# Patient Record
Sex: Female | Born: 1990 | Race: White | Hispanic: No | Smoking: Never smoker
Health system: Southern US, Community
[De-identification: ages and names within clinical notes are randomized; demographics above are authoritative.]

## PROBLEM LIST (undated history)

## (undated) ENCOUNTER — Inpatient Hospital Stay: Payer: Self-pay

## (undated) DIAGNOSIS — F329 Major depressive disorder, single episode, unspecified: Secondary | ICD-10-CM

## (undated) DIAGNOSIS — F909 Attention-deficit hyperactivity disorder, unspecified type: Secondary | ICD-10-CM

## (undated) DIAGNOSIS — R569 Unspecified convulsions: Secondary | ICD-10-CM

## (undated) DIAGNOSIS — F32A Depression, unspecified: Secondary | ICD-10-CM

## (undated) HISTORY — DX: Depression, unspecified: F32.A

## (undated) HISTORY — DX: Unspecified convulsions: R56.9

## (undated) HISTORY — DX: Attention-deficit hyperactivity disorder, unspecified type: F90.9

## (undated) HISTORY — PX: APPENDECTOMY: SHX54

## (undated) HISTORY — DX: Major depressive disorder, single episode, unspecified: F32.9

---
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
## 2017-05-24 NOTE — L&D Delivery Note (Signed)
Delivery Note  Date of delivery: 02/09/2018 Estimated Date of Delivery: 02/16/18 Patient's last menstrual period was 05/12/2017. EGA: 1582w0d  First Stage: Labor onset: 02/09/2018 0700 Augmentation : pitocin during second stage Analgesia /Anesthesia intrapartum: epidural SROM at 0700 02/09/2018  Melinda Mills presented to L&D with SROM. She was expectantly managed. Epidural placed for pain relief.   Second Stage: Complete dilation at 1311 Onset of pushing at 1316 Delivery at 1605 on 02/09/2018  She progressed to complete. Pitocin was used for augmentation during second stage. She had a spontaneous vaginal birth of a live female over an intact perineum. The fetal head was delivered in direct OA position with restitution to ROT. No nuchal cord. Anterior then posterior shoulders delivered spontaneously. Baby placed on mom's abdomen and attended to by transition RN. Cord clamped and cut after 2 minute delay by father of the baby, Melinda Mills. Cord blood obtained for newborn labs.  Third Stage: Placenta delivered spontaneously intact with 3VC at 1610 Placenta disposition: routine disposal Uterine bleeding brisk initially, bleeding slowed and uterine tone firm with uterine massage. Bleeding intermittently brisk, though slowed with massage each time. Large clots expressed with uterine massage. By the end of laceration repair, uterine tone remained firm with scant bleeding.  IV pitocin given for hemorrhage prophylaxis, along with 800mcg misoprostol PR.   2nd degree laceration identified  Anesthesia for repair: epidural Repair: 3-0 vicryl rapide CT Est. Blood Loss (mL): 800  Complications: none  Mom to postpartum.  Baby to Couplet care / Skin to Skin.  Newborn: Birth Weight: pending  Apgar Scores: 8, 9 Feeding planned: breastfeeding   Melinda Mills, CNM 02/09/2018 4:46 PM

## 2017-07-28 DIAGNOSIS — O0990 Supervision of high risk pregnancy, unspecified, unspecified trimester: Secondary | ICD-10-CM | POA: Insufficient documentation

## 2017-07-28 DIAGNOSIS — Q51818 Other congenital malformations of uterus: Secondary | ICD-10-CM | POA: Insufficient documentation

## 2017-07-28 LAB — OB RESULTS CONSOLE RPR: RPR: NONREACTIVE

## 2017-07-28 LAB — OB RESULTS CONSOLE VARICELLA ZOSTER ANTIBODY, IGG: VARICELLA IGG: NON-IMMUNE/NOT IMMUNE

## 2017-07-28 LAB — OB RESULTS CONSOLE HIV ANTIBODY (ROUTINE TESTING): HIV: NONREACTIVE

## 2017-07-28 LAB — OB RESULTS CONSOLE HEPATITIS B SURFACE ANTIGEN: Hepatitis B Surface Ag: NEGATIVE

## 2017-07-28 LAB — OB RESULTS CONSOLE RUBELLA ANTIBODY, IGM: RUBELLA: IMMUNE

## 2017-07-29 ENCOUNTER — Other Ambulatory Visit: Payer: Self-pay | Admitting: Obstetrics and Gynecology

## 2017-07-29 DIAGNOSIS — O0991 Supervision of high risk pregnancy, unspecified, first trimester: Secondary | ICD-10-CM

## 2017-07-29 DIAGNOSIS — Z369 Encounter for antenatal screening, unspecified: Secondary | ICD-10-CM

## 2017-08-18 ENCOUNTER — Ambulatory Visit (HOSPITAL_BASED_OUTPATIENT_CLINIC_OR_DEPARTMENT_OTHER)
Admission: RE | Admit: 2017-08-18 | Discharge: 2017-08-18 | Disposition: A | Discharge status: Home / Self Care | Payer: BC Managed Care – PPO | Source: Ambulatory Visit | Attending: Obstetrics and Gynecology | Admitting: Obstetrics and Gynecology

## 2017-08-18 ENCOUNTER — Ambulatory Visit
Admission: RE | Admit: 2017-08-18 | Discharge: 2017-08-18 | Disposition: A | Discharge status: Home / Self Care | Payer: BC Managed Care – PPO | Source: Ambulatory Visit | Attending: Obstetrics & Gynecology | Admitting: Obstetrics & Gynecology

## 2017-08-18 VITALS — BP 138/68 | HR 100 | Temp 98.0°F | Resp 18 | Ht 63.6 in | Wt 144.6 lb

## 2017-08-18 DIAGNOSIS — Z90721 Acquired absence of ovaries, unilateral: Secondary | ICD-10-CM | POA: Insufficient documentation

## 2017-08-18 DIAGNOSIS — Z369 Encounter for antenatal screening, unspecified: Secondary | ICD-10-CM

## 2017-08-18 DIAGNOSIS — Z3A14 14 weeks gestation of pregnancy: Secondary | ICD-10-CM | POA: Diagnosis not present

## 2017-08-18 DIAGNOSIS — Q600 Renal agenesis, unilateral: Secondary | ICD-10-CM | POA: Diagnosis not present

## 2017-08-18 DIAGNOSIS — Z820 Family history of epilepsy and other diseases of the nervous system: Secondary | ICD-10-CM | POA: Diagnosis not present

## 2017-08-18 NOTE — Progress Notes (Signed)
Deborah Wells, MS, CGC performed an integral service incident to the physician's initial service.  I was physically present in the clinical area and was immediately available to render assistance.   Chaunta Bejarano M. Edrei Norgaard, MD 

## 2017-08-18 NOTE — Progress Notes (Signed)
McVey, [name] Length of Consultation: 60 minutes   [name]  was referred to [facility] for genetic counseling to review prenatal screening and testing options along with her personal history of Mullerian anomalies and family history of Huntington disease.  This note summarizes the information we discussed.    We offered the following routine screening tests for this pregnancy:  First trimester screening, which includes nuchal translucency ultrasound screen and first trimester maternal serum marker screening.  The nuchal translucency has approximately an 80% detection rate for Down syndrome and can be positive for other chromosome abnormalities as well as congenital heart defects.  When combined with a maternal serum marker screening, the detection rate is up to 90% for Down syndrome and up to 97% for trisomy 18.     Maternal serum marker screening, a blood test that measures pregnancy proteins, can provide risk assessments for Down syndrome, trisomy 18, and open neural tube defects (spina bifida, anencephaly). Because it does not directly examine the fetus, it cannot positively diagnose or rule out these problems.  Targeted ultrasound uses high frequency sound waves to create an image of the developing fetus.  An ultrasound is often recommended as a routine means of evaluating the pregnancy.  It is also used to screen for fetal anatomy problems (for example, a heart defect) that might be suggestive of a chromosomal or other abnormality.   Should these screening tests indicate an increased concern, then the following additional testing options would be offered:  The chorionic villus sampling procedure is available for first trimester chromosome analysis.  This involves the withdrawal of a small amount of chorionic villi (tissue from the developing placenta).  Risk of pregnancy loss is estimated to be approximately 1 in 200 to 1 in 100 (0.5 to 1%).  There is  approximately a 1% (1 in 100) chance that the CVS chromosome results will be unclear.  Chorionic villi cannot be tested for neural tube defects.     Amniocentesis involves the removal of a small amount of amniotic fluid from the sac surrounding the fetus with the use of a thin needle inserted through the maternal abdomen and uterus.  Ultrasound guidance is used throughout the procedure.  Fetal cells from amniotic fluid are directly evaluated and > 99.5% of chromosome problems and > 98% of open neural tube defects can be detected. This procedure is generally performed after the 15th week of pregnancy.  The main risks to this procedure include complications leading to miscarriage in less than 1 in 200 cases (0.5%).  As another option for information if the pregnancy is suspected to be an an increased chance for certain chromosome conditions, we also reviewed the availability of cell free fetal DNA testing from maternal blood to determine whether or not the baby may have either Down syndrome, trisomy 6313, or trisomy 5318.  This test utilizes a maternal blood sample and DNA sequencing technology to isolate circulating cell free fetal DNA from maternal plasma.  The fetal DNA can then be analyzed for DNA sequences that are derived from the three most common chromosomes involved in aneuploidy, chromosomes 13, 18, and 21.  If the overall amount of DNA is greater than the expected level for any of these chromosomes, aneuploidy is suspected.  While we do not consider it a replacement for invasive testing and karyotype analysis, a negative result from this testing would be reassuring, though not a guarantee of a normal chromosome complement for the baby.  An  abnormal result is certainly suggestive of an abnormal chromosome complement, though we would still recommend CVS or amniocentesis to confirm any findings from this testing.  Cystic Fibrosis and Spinal Muscular Atrophy (SMA) screening were also discussed with the  patient. Both conditions are recessive, which means that both parents must be carriers in order to have a child with the disease.  Cystic fibrosis (CF) is one of the most common genetic conditions in persons of Caucasian ancestry.  This condition occurs in approximately 1 in 2,500 Caucasian persons and results in thickened secretions in the lungs, digestive, and reproductive systems.  For a baby to be at risk for having CF, both of the parents must be carriers for this condition.  Approximately 1 in 74 Caucasian persons is a carrier for CF.  Current carrier testing looks for the most common mutations in the gene for CF and can detect approximately 90% of carriers in the Caucasian population.  This means that the carrier screening can greatly reduce, but cannot eliminate, the chance for an individual to have a child with CF.  If an individual is found to be a carrier for CF, then carrier testing would be available for the partner. As part of [place]'s newborn screening profile, all babies born in the state of [place] will have a two-tier screening process.  Specimens are first tested to determine the concentration of immunoreactive trypsinogen (IRT).  The top 5% of specimens with the highest IRT values then undergo DNA testing using a panel of over 40 common CF mutations. SMA is a neurodegenerative disorder that leads to atrophy of skeletal muscle and overall weakness.  This condition is also more prevalent in the Caucasian population, with 1 in 40-1 in 60 persons being a carrier and 1 in 6,000-1 in 10,000 children being affected.  There are multiple forms of the disease, with some causing death in infancy to other forms with survival into adulthood.  The genetics of SMA is complex, but carrier screening can detect up to 95% of carriers in the Caucasian population.  Similar to CF, a negative result can greatly reduce, but cannot eliminate, the chance to have a child with SMA.  We obtained a detailed  family history and pregnancy history.  [name] reported that this is her first pregnancy.  She found out when she had appendicitis in her teens that she has a congenital absent right kidney, fallopian tube and ovary as well as an abnormally shaped uterus and possible abnormal vagina, though she was not sure of the details. She has no other health concerns, birth differences or developmental delays.  There are no other family members with similar birth differences.  We discussed that it is not uncommon to see this combination of anomalies together.  These may be referred to as Mullerian duct anomalies and may occur sporadically or as part of some genetic syndromes.  The chance for recurrence in the children of individuals with these anomalies will vary depending upon the underlying cause and may range from a very small chance for sporadic cases up to 50% in individuals with a dominant genetic cause.  The best way to evaluate this pregnancy would be with a level 2 ultrasound in the second trimester, which we have scheduled in our clinic at [redacted] weeks gestation.  She signed a medical record release for [place] to obtain results of imaging that showed her anatomical differences, per Dr. Janeann Forehand request.  If desired, we could consider a referral for a medical  genetics consultation if an evaluation is desired to assess [name] for possible genetic syndromes.  Also in the family history, [name] reported that her maternal grandmother had Huntington disease (HD) and underwent genetic testing; her mother also had genetic testing with an increased number of repeats.  The mother then spoke with our patient about HD genetic testing, which she elected to have while in college.  No formal neurological evaluation, psychological evaluation or genetic counseling was offered as part of that testing.  The results were said to have been given to her by her OB and were in the "grey zone", though she is not sure of the exact repeat  number (she thinks 39).  We offered to provide a medical record release, which she signed, to allow [place] to obtain those results.  I reviewed with her the usual protocol for presymptomatic HD testing and the importance of making sure she has the most complete information possible, which would ideally include meeting with a genetic counselor who specializes in HD testing.  We will work to facilitate this as desired.  I will follow up with her once these results are obtained.  We did review the dominant inheritance of HD and the complex considerations associated with prenatal diagnosis for this condition.  The remainder of the family history was reported to be unremarkable for birth defects, intellectual delays, recurrent pregnancy loss or known chromosome abnormalities.  [name] reported no complications or exposures in this pregnancy that would be expected to increase the risk for birth defects.  After consideration of the options, [name] elected to proceed with first trimester screening.  We will contact her once we get the HD test results.  She declined CF and SMA carrier screening.  An ultrasound was performed at the time of the visit.  The gestational age was consistent with 14 weeks.  Fetal anatomy could not be assessed due to early gestational age.  Please refer to the ultrasound report for details of that study.  [name] was encouraged to call with questions or concerns.  We can be contacted at [phone].  Tests ordered:  First trimester screening, record release forms sent   Cherly Anderson, MS, CGC

## 2017-09-01 ENCOUNTER — Telehealth: Payer: Self-pay | Admitting: Obstetrics and Gynecology

## 2017-09-01 NOTE — Telephone Encounter (Signed)
  Ms. Daphine DeutscherMartin elected to undergo First Trimester screening on 08/18/17.  To review, first trimester screening, includes nuchal translucency ultrasound screen and/or first trimester maternal serum marker screening.  The nuchal translucency has approximately an 80% detection rate for Down syndrome and can be positive for other chromosome abnormalities as well as heart defects.  When combined with a maternal serum marker screening, the detection rate is up to 90% for Down syndrome and up to 97% for trisomy 13 and 18.     The results of the First Trimester Nuchal Translucency and Biochemical Screening were within normal range.  The risk for Down syndrome is now estimated to be less than 1 in 10,000.  The risk for Trisomy 13/18 is also estimated to be less than 1 in 10,000.  Should more definitive information be desired, we would offer amniocentesis.  Because we do not yet know the effectiveness of combined first and second trimester screening, we do not recommend a maternal serum screen to assess the chance for chromosome conditions.  However, if screening for neural tube defects is desired, maternal serum screening for AFP only can be performed between 15 and [redacted] weeks gestation.    We will contact the patient with the number to schedule a genetics visit to discuss her prior Huntington disease testing at her convenience.  This was performed several years ago, but did not include a formal genetics consultation which would be recommended to review those results in detail.  Cherly Andersoneborah F. Chasmine Lender, MS, CGC

## 2017-09-08 ENCOUNTER — Other Ambulatory Visit: Payer: Self-pay | Admitting: *Deleted

## 2017-09-08 DIAGNOSIS — Z90721 Acquired absence of ovaries, unilateral: Secondary | ICD-10-CM

## 2017-09-08 DIAGNOSIS — Q600 Renal agenesis, unilateral: Secondary | ICD-10-CM

## 2017-09-15 ENCOUNTER — Ambulatory Visit
Admission: RE | Admit: 2017-09-15 | Discharge: 2017-09-15 | Disposition: A | Discharge status: Home / Self Care | Payer: BC Managed Care – PPO | Source: Ambulatory Visit | Attending: Obstetrics and Gynecology | Admitting: Obstetrics and Gynecology

## 2017-09-15 DIAGNOSIS — Z3A18 18 weeks gestation of pregnancy: Secondary | ICD-10-CM | POA: Insufficient documentation

## 2017-09-15 DIAGNOSIS — Q600 Renal agenesis, unilateral: Secondary | ICD-10-CM

## 2018-01-24 LAB — OB RESULTS CONSOLE GC/CHLAMYDIA
Chlamydia: NEGATIVE
Gonorrhea: NEGATIVE

## 2018-01-24 LAB — OB RESULTS CONSOLE GBS: GBS: NEGATIVE

## 2018-02-09 ENCOUNTER — Other Ambulatory Visit: Payer: Self-pay

## 2018-02-09 ENCOUNTER — Inpatient Hospital Stay
Admission: EM | Admit: 2018-02-09 | Discharge: 2018-02-11 | DRG: 807 | Disposition: A | Discharge status: Home / Self Care | Payer: BC Managed Care – PPO | Attending: Certified Nurse Midwife | Admitting: Certified Nurse Midwife

## 2018-02-09 ENCOUNTER — Inpatient Hospital Stay: Payer: BC Managed Care – PPO | Admitting: Certified Registered"

## 2018-02-09 DIAGNOSIS — Z3483 Encounter for supervision of other normal pregnancy, third trimester: Secondary | ICD-10-CM | POA: Diagnosis present

## 2018-02-09 DIAGNOSIS — Q514 Unicornate uterus: Secondary | ICD-10-CM

## 2018-02-09 DIAGNOSIS — Z3A39 39 weeks gestation of pregnancy: Secondary | ICD-10-CM | POA: Diagnosis not present

## 2018-02-09 DIAGNOSIS — O3403 Maternal care for unspecified congenital malformation of uterus, third trimester: Secondary | ICD-10-CM | POA: Diagnosis present

## 2018-02-09 LAB — CBC
HCT: 34.3 % — ABNORMAL LOW (ref 35.0–47.0)
HEMATOCRIT: 29.6 % — AB (ref 35.0–47.0)
HEMOGLOBIN: 10.2 g/dL — AB (ref 12.0–16.0)
Hemoglobin: 12.1 g/dL (ref 12.0–16.0)
MCH: 30.6 pg (ref 26.0–34.0)
MCH: 30.2 pg (ref 26.0–34.0)
MCHC: 35.2 g/dL (ref 32.0–36.0)
MCHC: 34.3 g/dL (ref 32.0–36.0)
MCV: 86.8 fL (ref 80.0–100.0)
MCV: 87.9 fL (ref 80.0–100.0)
PLATELETS: 163 10*3/uL (ref 150–440)
Platelets: 152 10*3/uL (ref 150–440)
RBC: 3.95 MIL/uL (ref 3.80–5.20)
RBC: 3.37 MIL/uL — AB (ref 3.80–5.20)
RDW: 14.7 % — ABNORMAL HIGH (ref 11.5–14.5)
RDW: 14.8 % — ABNORMAL HIGH (ref 11.5–14.5)
WBC: 10.6 10*3/uL (ref 3.6–11.0)
WBC: 16.5 10*3/uL — AB (ref 3.6–11.0)

## 2018-02-09 LAB — TYPE AND SCREEN
ABO/RH(D): O POS
Antibody Screen: NEGATIVE

## 2018-02-09 LAB — COMPREHENSIVE METABOLIC PANEL
ALK PHOS: 141 U/L — AB (ref 38–126)
ALT: 18 U/L (ref 0–44)
AST: 44 U/L — AB (ref 15–41)
Albumin: 2.8 g/dL — ABNORMAL LOW (ref 3.5–5.0)
Anion gap: 8 (ref 5–15)
BUN: 8 mg/dL (ref 6–20)
CALCIUM: 8.6 mg/dL — AB (ref 8.9–10.3)
CO2: 24 mmol/L (ref 22–32)
CREATININE: 0.88 mg/dL (ref 0.44–1.00)
Chloride: 103 mmol/L (ref 98–111)
GFR calc non Af Amer: >60 mL/min (ref >60)
Glucose, Bld: 111 mg/dL — ABNORMAL HIGH (ref 70–99)
Potassium: 3.7 mmol/L (ref 3.5–5.1)
SODIUM: 135 mmol/L (ref 135–145)
Total Bilirubin: 0.2 mg/dL — ABNORMAL LOW (ref 0.3–1.2)
Total Protein: 5.5 g/dL — ABNORMAL LOW (ref 6.5–8.1)

## 2018-02-09 LAB — PROTEIN / CREATININE RATIO, URINE
Creatinine, Urine: 100 mg/dL
PROTEIN CREATININE RATIO: 0.2 mg/mg{creat} — AB (ref 0.00–0.15)
TOTAL PROTEIN, URINE: 20 mg/dL

## 2018-02-09 MED ORDER — IBUPROFEN 600 MG PO TABS
600.0000 mg | ORAL_TABLET | Freq: Four times a day (QID) | ORAL | Status: DC
  Administered 2018-02-09 – 2018-02-11 (×8): 600 mg via ORAL
  Filled 2018-02-09 (×9): qty 1

## 2018-02-09 MED ORDER — LIDOCAINE-EPINEPHRINE (PF) 1.5 %-1:200000 IJ SOLN
INTRAMUSCULAR | Status: DC | PRN
  Administered 2018-02-09: 3 mL via EPIDURAL

## 2018-02-09 MED ORDER — ACETAMINOPHEN 325 MG PO TABS: 650.0000 mg | ORAL_TABLET | ORAL | Status: DC | PRN

## 2018-02-09 MED ORDER — SENNOSIDES-DOCUSATE SODIUM 8.6-50 MG PO TABS
2.0000 | ORAL_TABLET | ORAL | Status: DC
  Administered 2018-02-09 – 2018-02-10 (×2): 2 via ORAL
  Filled 2018-02-09 (×2): qty 2

## 2018-02-09 MED ORDER — OXYTOCIN 10 UNIT/ML IJ SOLN
INTRAMUSCULAR | Status: AC
  Filled 2018-02-09: qty 2

## 2018-02-09 MED ORDER — SIMETHICONE 80 MG PO CHEW: 160.0000 mg | CHEWABLE_TABLET | ORAL | Status: DC | PRN

## 2018-02-09 MED ORDER — SOD CITRATE-CITRIC ACID 500-334 MG/5ML PO SOLN: 30.0000 mL | ORAL | Status: DC | PRN

## 2018-02-09 MED ORDER — OXYTOCIN 40 UNITS IN LACTATED RINGERS INFUSION - SIMPLE MED: 2.5000 [IU]/h | INTRAVENOUS | Status: DC

## 2018-02-09 MED ORDER — LACTATED RINGERS IV SOLN: 500.0000 mL | INTRAVENOUS | Status: DC | PRN

## 2018-02-09 MED ORDER — DIBUCAINE 1 % RE OINT
1.0000 "application " | TOPICAL_OINTMENT | RECTAL | Status: DC | PRN
  Administered 2018-02-09 – 2018-02-11 (×2): 1 via RECTAL
  Filled 2018-02-09 (×2): qty 28

## 2018-02-09 MED ORDER — BUPIVACAINE HCL (PF) 0.25 % IJ SOLN
INTRAMUSCULAR | Status: DC | PRN
  Administered 2018-02-09 (×2): 5 mL via EPIDURAL

## 2018-02-09 MED ORDER — OXYTOCIN 40 UNITS IN LACTATED RINGERS INFUSION - SIMPLE MED
1.0000 m[IU]/min | INTRAVENOUS | Status: DC
  Administered 2018-02-09: 2 m[IU]/min via INTRAVENOUS
  Filled 2018-02-09: qty 1000

## 2018-02-09 MED ORDER — LACTATED RINGERS IV SOLN
INTRAVENOUS | Status: DC
  Administered 2018-02-09: 10:00:00 via INTRAVENOUS

## 2018-02-09 MED ORDER — LIDOCAINE HCL (PF) 1 % IJ SOLN
INTRAMUSCULAR | Status: DC | PRN
  Administered 2018-02-09: 3 mL via INTRADERMAL

## 2018-02-09 MED ORDER — ONDANSETRON HCL 4 MG/2ML IJ SOLN
4.0000 mg | Freq: Four times a day (QID) | INTRAMUSCULAR | Status: DC | PRN
  Administered 2018-02-09: 4 mg via INTRAVENOUS
  Filled 2018-02-09: qty 2

## 2018-02-09 MED ORDER — OXYCODONE-ACETAMINOPHEN 5-325 MG PO TABS: 1.0000 | ORAL_TABLET | ORAL | Status: DC | PRN

## 2018-02-09 MED ORDER — PRENATAL MULTIVITAMIN CH
1.0000 | ORAL_TABLET | Freq: Every day | ORAL | Status: DC
  Administered 2018-02-10 – 2018-02-11 (×2): 1 via ORAL
  Filled 2018-02-09 (×3): qty 1

## 2018-02-09 MED ORDER — METHYLERGONOVINE MALEATE 0.2 MG/ML IJ SOLN
INTRAMUSCULAR | Status: AC
  Filled 2018-02-09: qty 1

## 2018-02-09 MED ORDER — FENTANYL 2.5 MCG/ML W/ROPIVACAINE 0.15% IN NS 100 ML EPIDURAL (ARMC)
EPIDURAL | Status: DC | PRN
  Administered 2018-02-09: 12 mL/h via EPIDURAL

## 2018-02-09 MED ORDER — DIPHENHYDRAMINE HCL 25 MG PO CAPS: 25.0000 mg | ORAL_CAPSULE | Freq: Four times a day (QID) | ORAL | Status: DC | PRN

## 2018-02-09 MED ORDER — COCONUT OIL OIL
1.0000 "application " | TOPICAL_OIL | Status: DC | PRN
  Administered 2018-02-09: 1 via TOPICAL
  Filled 2018-02-09: qty 120

## 2018-02-09 MED ORDER — MISOPROSTOL 200 MCG PO TABS
ORAL_TABLET | ORAL | Status: AC
  Administered 2018-02-09: 800 ug via RECTAL
  Filled 2018-02-09: qty 4

## 2018-02-09 MED ORDER — AMMONIA AROMATIC IN INHA
RESPIRATORY_TRACT | Status: AC
  Filled 2018-02-09: qty 10

## 2018-02-09 MED ORDER — LIDOCAINE HCL (PF) 1 % IJ SOLN
INTRAMUSCULAR | Status: AC
  Filled 2018-02-09: qty 30

## 2018-02-09 MED ORDER — OXYCODONE-ACETAMINOPHEN 5-325 MG PO TABS: 2.0000 | ORAL_TABLET | ORAL | Status: DC | PRN

## 2018-02-09 MED ORDER — CALCIUM CARBONATE ANTACID 500 MG PO CHEW
2.0000 | CHEWABLE_TABLET | Freq: Once | ORAL | Status: AC
  Administered 2018-02-09: 400 mg via ORAL

## 2018-02-09 MED ORDER — LIDOCAINE HCL (PF) 1 % IJ SOLN: 30.0000 mL | INTRAMUSCULAR | Status: DC | PRN

## 2018-02-09 MED ORDER — VARICELLA VIRUS VACCINE LIVE 1350 PFU/0.5ML IJ SUSR: 0.5000 mL | Freq: Once | INTRAMUSCULAR | Status: DC

## 2018-02-09 MED ORDER — FENTANYL 2.5 MCG/ML W/ROPIVACAINE 0.15% IN NS 100 ML EPIDURAL (ARMC)
EPIDURAL | Status: AC
  Filled 2018-02-09: qty 100

## 2018-02-09 MED ORDER — TERBUTALINE SULFATE 1 MG/ML IJ SOLN: 0.2500 mg | Freq: Once | INTRAMUSCULAR | Status: DC | PRN

## 2018-02-09 MED ORDER — OXYTOCIN BOLUS FROM INFUSION
500.0000 mL | Freq: Once | INTRAVENOUS | Status: AC
  Administered 2018-02-09: 500 mL via INTRAVENOUS

## 2018-02-09 MED ORDER — ONDANSETRON HCL 4 MG/2ML IJ SOLN: 4.0000 mg | INTRAMUSCULAR | Status: DC | PRN

## 2018-02-09 MED ORDER — BUTORPHANOL TARTRATE 1 MG/ML IJ SOLN
1.0000 mg | INTRAMUSCULAR | Status: DC | PRN
  Administered 2018-02-09: 1 mg via INTRAVENOUS
  Filled 2018-02-09: qty 1

## 2018-02-09 MED ORDER — BENZOCAINE-MENTHOL 20-0.5 % EX AERO
1.0000 "application " | INHALATION_SPRAY | CUTANEOUS | Status: DC | PRN
  Administered 2018-02-09 – 2018-02-11 (×2): 1 via TOPICAL
  Filled 2018-02-09 (×2): qty 56

## 2018-02-09 MED ORDER — WITCH HAZEL-GLYCERIN EX PADS
1.0000 "application " | MEDICATED_PAD | CUTANEOUS | Status: DC | PRN
  Administered 2018-02-09 – 2018-02-11 (×2): 1 via TOPICAL
  Filled 2018-02-09 (×2): qty 100

## 2018-02-09 MED ORDER — ONDANSETRON HCL 4 MG PO TABS: 4.0000 mg | ORAL_TABLET | ORAL | Status: DC | PRN

## 2018-02-09 MED ORDER — CALCIUM CARBONATE ANTACID 500 MG PO CHEW
CHEWABLE_TABLET | ORAL | Status: AC
  Filled 2018-02-09: qty 2

## 2018-02-09 MED ORDER — ACETAMINOPHEN 325 MG PO TABS
650.0000 mg | ORAL_TABLET | ORAL | Status: DC | PRN
  Administered 2018-02-09: 650 mg via ORAL
  Filled 2018-02-09: qty 2

## 2018-02-09 NOTE — Lactation Note (Signed)
This note was copied from a baby's chart. Lactation Consultation Note  Patient Name: Melinda Mills ZOXWR'UToday's Date: 02/09/2018 Reason for consult: Initial assessment   Maternal Data    Feeding Feeding Type: Breast Fed  LATCH Score Latch: Grasps breast easily, tongue down, lips flanged, rhythmical sucking.  Audible Swallowing: Spontaneous and intermittent  Type of Nipple: Everted at rest and after stimulation  Comfort (Breast/Nipple): Soft / non-tender  Hold (Positioning): Assistance needed to correctly position infant at breast and maintain latch.  LATCH Score: 9  Interventions Interventions: Breast feeding basics reviewed;Skin to skin;Assisted with latch;Hand express;Support pillows  Lactation Tools Discussed/Used     Consult Status  LC to room to assist with breastfeeding. LC positioned infant at left breast in the cradle position. Infant was able to successfully latch. Mother taught how to hand express, frequency of wet and dirty diapers, clusterfeeding and what to expect in the first 24 hours.     Melinda Mills 02/09/2018, 4:54 PM

## 2018-02-09 NOTE — H&P (Addendum)
OB History & Physical   History of Present Illness:  Chief Complaint:   HPI:  Melinda Mills is a 27 y.o. G1P0 female at [redacted]w[redacted]d dated by LMP c/w [redacted]w[redacted]d ultrasound. She presents to L&D with reports of leakage of fluid.   She reports:  -active fetal movement -LOF/SROM at 0700 this morning with a big gush, followed by continuous leakage of fluids -no vaginal bleeding -mild contractions every 5 minutes  Pregnancy Issues: 1. Varicella non-immune 2. 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 No pertinent past medical history.  Past Surgical History:  Procedure Laterality Date  . APPENDECTOMY  2008/2009    No Known Allergies  Prior to Admission medications   Medication Sig Start Date End Date Taking? Authorizing Provider  Prenatal Vit-Fe Fumarate-FA (PRENATAL MULTIVITAMIN) TABS tablet Take 1 tablet by mouth daily at 12 noon.   Yes [provider]     Prenatal care site: [facility]   Social History: She  reports that she has never smoked. She has never used smokeless tobacco. She reports that she does not drink alcohol or use drugs.  Family History: family history is not on file. She was adopted.   Review of Systems: A full review of systems was performed and negative except as noted in the HPI.    Physical Exam:  Vital Signs: BP (!) 135/91 (BP Location: Left Arm)   Pulse 95   Temp 98.4 F (36.9 C) (Oral)   Resp 15   Ht 5\' 3"  (1.6 m)   Wt 83 kg   BMI 32.42 kg/m   General:   alert, cooperative, appears stated age and no distress  Skin:  normal and no rash or abnormalities  Neurologic:    Alert & oriented x 3  Lungs:   clear to auscultation bilaterally  Heart:   regular rate and rhythm, S1, S2 normal, no murmur, click, rub or gallop  Abdomen:  soft, non-tender; bowel sounds normal; no masses,  no organomegaly  Pelvis:  SSE: + pool, + nitrazine, + ferning  FHT:  145 BPM   Presentations: cephalic  Cervix:    Dilation: 3.5   Effacement: 80%   Station:  -1   Consistency: soft   Position: middle  Extremities: : non-tender, symmetric, no edema bilaterally.    EFW: ultrasound 02/07/18: EFW=7lb1oz (3203g)=34%, AFI=14.71cm, Fhr=153bpm, Placenta fundal, position vertex  No results found for this or any previous visit (from the past 24 hour(s)).  Pertinent Results:  Prenatal Labs: Blood type/Rh O+  Antibody screen neg  Rubella Immune  Varicella Non-Immune  RPR NR  HBsAg Neg  HIV NR  GC neg  Chlamydia neg  Genetic screening negative  1 hour GTT 111  3 hour GTT n/a  GBS negative   FHT: FHR: 145 bpm, variability: moderate,  accelerations:  Present,  decelerations:  Absent Category/reactivity:  Category I TOCO: irregular, every 4-7 minutes  Assessment:  Melinda Mills is a 27 y.o. G1P0 female at [redacted]w[redacted]d with SROM.   Plan:  1. Admit to Labor & Delivery; consents reviewed and obtained  2. Fetal Well being  - Fetal Tracing: category I - GBS negative - Presentation: cephalic confirmed by leopold's and vaginal exam   3. Routine OB: - Prenatal labs reviewed, as above - Rh positive - CBC & T&S on admit - Breakfast, followed by clear fluids, IVF  4. Monitoring/augmentation of Labor -  Contractions monitored with external toco in place -  Pelvis unproven -  Plan for augmentattion with pitocin if needed -  Plan for continuous fetal monitoring  -  Maternal pain control as desired: IVPM, nitrous, regional anesthesia - Anticipate vaginal delivery  5. Post Partum Planning: - Infant feeding: breastfeeding - Contraception: POPs  Melinda Mills, [place] 02/09/2018 9:28 AM ----- Melinda Mills Certified Nurse Midwife [facility], Department of OB/GYN [facility]

## 2018-02-09 NOTE — Discharge Summary (Addendum)
Obstetric Discharge Summary   Patient ID: Patient Name: Melinda Mills DOB: 23-Jan-1991 MRN: [id number]  Date of Admission: 02/09/2018 Date of Delivery: 02/09/2018 Delivered by: Genia DelMargaret Haviland, CNM Date of Discharge: 02/09/2018  [facility]  WUJ:[id number]:Patient's last menstrual period was 05/12/2017. EDC Estimated Date of Delivery: 02/16/18 Gestational Age at Delivery: 3774w0d   Antepartum complications:  1. Varicella non-immune 2. 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 Labor and delivery indication for care or intervention 02/09/2018  . Congenital renal agenesis, unilateral   . Family history of Huntington's disease   . Ovary absent     Augmentation: Pitocin during second stage Complications: None Intrapartum complications/course: Melinda Mills presented to L&D with SROM. She was expectantly managed. Epidural placed for pain relief. She progressed to complete. Pitocin was used for augmentation during second stage. She had a spontaneous vaginal birth of a live female over an intact perineum. The fetal head was delivered in direct OA position with restitution to ROT. No nuchal cord. Anterior then posterior shoulders delivered spontaneously. Baby placed on mom's abdomen and attended to by transition RN. Cord clamped and cut after 2 minute delay by father of the baby, Melinda Mills. Cord blood obtained for newborn labs. Placenta delivered spontaneously intact with 3VC at 1610. Uterine bleeding brisk initially, bleeding slowed and uterine tone firm with uterine massage. Bleeding intermittently brisk, though slowed with massage each time. Large clots expressed with uterine massage. By the end of laceration repair, uterine tone remained firm with scant bleeding. IV pitocin given for hemorrhage prophylaxis, along with 800mcg misoprostol PR. 2nd degree  laceration identified and repaired in the usual fashion with good hemostasis.  Delivery Type: spontaneous vaginal delivery Anesthesia: epidural Placenta: spontaneous Laceration: 2nd degree perineal, repaired Episiotomy: none  Newborn Data: Live born female "Melinda Mills" Birth Weight:  7#1oz APGAR: 8, 9  Newborn Delivery   Birth date/time:  02/09/2018 16:05:00 Delivery type:  Vaginal, Spontaneous     Postpartum Course  Patient had an uncomplicated postpartum course.  By time of discharge on PPD#2, her pain was controlled on oral pain medications; she had appropriate lochia and was ambulating, voiding without difficulty and tolerating regular diet.  She was deemed stable for discharge to home. Pt is doing well breastfeeding.      Labs: CBC Latest Ref Rng & Units 02/09/2018 02/09/2018  WBC 3.6 - 11.0 K/uL 16.5(H) 10.6  Hemoglobin 12.0 - 16.0 g/dL 10.2(L) 12.1  Hematocrit 35.0 - 47.0 % 29.6(L) 34.3(L)  Platelets 150 - 440 K/uL 152 163   O POS  Physical exam:  BP 137/83   Pulse (!) 108   Temp 99.8 F (37.7 C) (Oral)   Resp 20   Ht 5\' 3"  (1.6 m)   Wt 83 kg   LMP 05/12/2017   SpO2 96%   Breastfeeding? Unknown   BMI 32.42 kg/m  General: alert and no distress Pulm: normal respiratory effort Lochia: appropriate Abdomen: soft, NT Uterine Fundus: firm, below umbilicus Extremities: No evidence of DVT seen on physical exam. No lower extremity edema.  Disposition: stable, discharge to home Baby Feeding: breastmilk Baby Disposition: home with mom  Contraception: POPs  Prenatal Labs:  Blood type/Rh O+  Antibody screen neg  Rubella Immune  Varicella Non-immune  RPR NR  HBsAg Neg  HIV NR  GC neg  Chlamydia neg  Genetic screening negative  1 hour GTT 111  3 hour GTT n/a  GBS negative    Rh Immune globulin given: n/a Rubella vaccine given: n/a Varicella vaccine given: ordered Tdap vaccine given in AP or PP setting: 12/06/2017 Flu vaccine given in AP or PP setting:  n/a  Plan:  Melinda Mills was discharged to home in good condition. Follow-up appointment at [facility] with delivery provider in 6 weeks  Discharge Instructions: Take a Prenatal vitamin and Ferrous Sulfate as prescribed.  Activity: Advance as tolerated. Pelvic rest for 6 weeks.   Diet: Regular Discharge Medications: Micronor 1 by mouth everyday starting at 2 weeks postpartum, Ferrous sulfate:take as prescribed, Prenatal vitamin daily, Motrin 600 mg by mouth every 8 hours for pain.  Allergies: NKA Outpatient follow up:  Follow-up Information    Genia Del, CNM. Schedule an appointment as soon as possible for a visit in 6 week(s).   Specialty:  Certified Nurse Midwife Why:  For routine postpartum visit Contact information: [address] [place] [address] [phone]         ________________________________ Signed: Myrtie Cruise, MSN, CNM, FNP Certified Nurse Midwife [facility]

## 2018-02-09 NOTE — Progress Notes (Signed)
Labor Progress Note  Melinda Mills is a 27 y.o. G1P0 at 7151w0d by LMP c/w 6472w0d ultrasound admitted for rupture of membranes.   Subjective:  Patient reports very painful contractions and pressure.   Objective: BP 133/77   Pulse 95   Temp 97.6 F (36.4 C) (Oral)   Resp 15   Ht 5\' 3"  (1.6 m)   Wt 83 kg   SpO2 94%   BMI 32.42 kg/m   Fetal Assessment: FHT:  FHR: 130 bpm, variability: moderate,  accelerations:  Present,  decelerations:  Present early Category/reactivity:  Category I UC:   regular, every 2-3 minutes SVE:    Dilation: 9cm  Effacement: 100%  Station:  0  Consistency: soft  Position: anterior  Membrane status: SROM 0700 02/09/18  Amniotic color: clear  Labs: Lab Results  Component Value Date   WBC 10.6 02/09/2018   HGB 12.1 02/09/2018   HCT 34.3 (L) 02/09/2018   MCV 86.8 02/09/2018   PLT 163 02/09/2018    Assessment / Plan: Spontaneous labor, progressing normally  Labor: Progressing normally Fetal Wellbeing:  Category I Pain Control:  Epidural being placed now I/D:  n/a Anticipated MOD:  NSVD  Melinda Mills, CNM 02/09/2018, 12:09 PM

## 2018-02-09 NOTE — Anesthesia Procedure Notes (Signed)
Epidural Patient location during procedure: OB  Staffing Anesthesiologist: Adams, James G, MD Resident/CRNA: Mecca Guitron, CRNA Performed: resident/CRNA   Preanesthetic Checklist Completed: patient identified, site marked, surgical consent, pre-op evaluation, timeout performed, IV checked, risks and benefits discussed and monitors and equipment checked  Epidural Patient position: sitting Prep: ChloraPrep and site prepped and draped Patient monitoring: heart rate, continuous pulse ox and blood pressure Approach: midline Location: L4-L5 Injection technique: LOR saline  Needle:  Needle type: Tuohy  Needle gauge: 17 G Needle length: 9 cm and 9 Needle insertion depth: 6 cm Catheter type: closed end flexible Catheter size: 19 Gauge Catheter at skin depth: 11 cm Test dose: negative and 1.5% lidocaine with Epi 1:200 K  Assessment Events: blood not aspirated, injection not painful, no injection resistance, negative IV test and no paresthesia  Additional Notes   Patient tolerated the insertion well without complications.Reason for block:procedure for pain     

## 2018-02-09 NOTE — Anesthesia Preprocedure Evaluation (Signed)
Anesthesia Evaluation  Patient identified by MRN, date of birth, ID band Patient awake    Reviewed: Allergy & Precautions, H&P , NPO status , Patient's Chart, lab work & pertinent test results  Airway Mallampati: II  TM Distance: >3 FB Neck ROM: full    Dental  (+) Dental Advidsory Given   Pulmonary neg pulmonary ROS,    Pulmonary exam normal        Cardiovascular negative cardio ROS Normal cardiovascular exam     Neuro/Psych Seizures -,     GI/Hepatic Neg liver ROS, GERD  ,  Endo/Other  negative endocrine ROS  Renal/GU Renal disease  negative genitourinary   Musculoskeletal   Abdominal   Peds  Hematology negative hematology ROS (+)   Anesthesia Other Findings   Reproductive/Obstetrics (+) Pregnancy                             Anesthesia Physical Anesthesia Plan  ASA: II  Anesthesia Plan: Epidural   Post-op Pain Management:    Induction:   PONV Risk Score and Plan:   Airway Management Planned:   Additional Equipment:   Intra-op Plan:   Post-operative Plan:   Informed Consent: I have reviewed the patients History and Physical, chart, labs and discussed the procedure including the risks, benefits and alternatives for the proposed anesthesia with the patient or authorized representative who has indicated his/her understanding and acceptance.     Plan Discussed with: CRNA and Anesthesiologist  Anesthesia Plan Comments:         Anesthesia Quick Evaluation

## 2018-02-10 LAB — COMPREHENSIVE METABOLIC PANEL
ALBUMIN: 2.5 g/dL — AB (ref 3.5–5.0)
ALT: 18 U/L (ref 0–44)
ANION GAP: 8 (ref 5–15)
AST: 49 U/L — ABNORMAL HIGH (ref 15–41)
Alkaline Phosphatase: 141 U/L — ABNORMAL HIGH (ref 38–126)
BUN: 9 mg/dL (ref 6–20)
CALCIUM: 7.9 mg/dL — AB (ref 8.9–10.3)
CO2: 25 mmol/L (ref 22–32)
CREATININE: 0.78 mg/dL (ref 0.44–1.00)
Chloride: 105 mmol/L (ref 98–111)
GFR calc non Af Amer: >60 mL/min (ref >60)
GLUCOSE: 116 mg/dL — AB (ref 70–99)
Potassium: 3.5 mmol/L (ref 3.5–5.1)
SODIUM: 138 mmol/L (ref 135–145)
TOTAL PROTEIN: 5.1 g/dL — AB (ref 6.5–8.1)

## 2018-02-10 LAB — CBC
HCT: 26.1 % — ABNORMAL LOW (ref 35.0–47.0)
Hemoglobin: 9.4 g/dL — ABNORMAL LOW (ref 12.0–16.0)
MCH: 31.5 pg (ref 26.0–34.0)
MCHC: 36 g/dL (ref 32.0–36.0)
MCV: 87.5 fL (ref 80.0–100.0)
Platelets: 151 10*3/uL (ref 150–440)
RBC: 2.98 MIL/uL — ABNORMAL LOW (ref 3.80–5.20)
RDW: 15.1 % — ABNORMAL HIGH (ref 11.5–14.5)
WBC: 12.5 10*3/uL — ABNORMAL HIGH (ref 3.6–11.0)

## 2018-02-10 LAB — RPR: RPR Ser Ql: NONREACTIVE

## 2018-02-10 MED ORDER — FERROUS SULFATE 325 (65 FE) MG PO TABS
325.0000 mg | ORAL_TABLET | Freq: Two times a day (BID) | ORAL | Status: DC
  Administered 2018-02-10 – 2018-02-11 (×3): 325 mg via ORAL
  Filled 2018-02-10 (×3): qty 1

## 2018-02-10 NOTE — Lactation Note (Signed)
This note was copied from a baby's chart. Lactation Consultation Note  Patient Name: Melinda Mills Reason for consult: Follow-up assessment;Primapara   Maternal Data  Mom is able to latch baby by herself at times to breast and feedling more confident  Feeding Feeding Type: Breast Fed Length of feed: (right breast)  LATCH Score Latch: Grasps breast easily, tongue down, lips flanged, rhythmical sucking.  Audible Swallowing: A few with stimulation  Type of Nipple: Everted at rest and after stimulation  Comfort (Breast/Nipple): Soft / non-tender  Hold (Positioning): Assistance needed to correctly position infant at breast and maintain latch.  LATCH Score: 8  Interventions Interventions: Assisted with latch  Lactation Tools Discussed/Used     Consult Status Consult Status: Follow-up Date: 02/11/18 Follow-up type: In-patient    Dyann KiefMarsha D Heaven [place] Mills, 7:25 PM

## 2018-02-10 NOTE — Lactation Note (Signed)
This note was copied from a baby's chart. Lactation Consultation Note  Patient Name: Melinda Aleen SellsHollie Martin [id number]'s Date: 02/10/2018 Reason for consult: Follow-up assessment;Primapara   Maternal Data Formula Feeding for Exclusion: No Has patient been taught Hand Expression?: Yes Does the patient have breastfeeding experience prior to this delivery?: No  Feeding Feeding Type: Breast Fed Length of feed: 2 min(few sucks left breast, sleepy just had circ)  LATCH Score Latch: Repeated attempts needed to sustain latch, nipple held in mouth throughout feeding, stimulation needed to elicit sucking reflex.  Audible Swallowing: None  Type of Nipple: Everted at rest and after stimulation  Comfort (Breast/Nipple): Soft / non-tender  Hold (Positioning): Assistance needed to correctly position infant at breast and maintain latch.  LATCH Score: 6  Interventions Interventions: Assisted with latch;Skin to skin;Hand express;Adjust position;Support pillows  Lactation Tools Discussed/Used WIC Program: No   Consult Status Consult Status: Follow-up Date: 02/10/18 Follow-up type: In-patient    Dyann KiefMarsha D Sanyah Molnar 02/10/2018, 11:19 AM

## 2018-02-10 NOTE — Progress Notes (Signed)
Post Partum Day 1 Subjective: Doing well, back hurts  Objective: Blood pressure 121/81, pulse 77, temperature 98.4 F (36.9 C), temperature source Oral, resp. rate 18, height 5\' 3"  (1.6 m), weight 83 kg, last menstrual period 05/12/2017, SpO2 98 %, unknown if currently breastfeeding.  Physical Exam:  General:A,A&O x3 HEART:S1S2, RRR, no M/R/G LUNGS: CTA bilat, no W/R/R Lochia: mod, no clots Uterine Fundus: U-2, FF Lac:Intact, no edema, healing DVT Evaluation: Neg Homans  Recent Labs    02/09/18 2037 02/10/18 0527  HGB 10.2* 9.4*  HCT 29.6* 26.1*    Assessment/Plan: A;1. PPD#1 2. Varicella non-immune P: DC in am _____________________________________ Myrtie Cruisearon W. Claudius Mich,RN, MSN, CNM, FNP Certified Nurse Midwife [facility]  LOS: 1 day   Sharee Pimplearon W Sandrika Schwinn 02/10/2018, 8:46 AM

## 2018-02-10 NOTE — Anesthesia Postprocedure Evaluation (Signed)
Anesthesia Post Note  Patient: Aleen SellsHollie Martin  Procedure(s) Performed: AN AD HOC LABOR EPIDURAL  Patient location during evaluation: Mother Baby Anesthesia Type: Epidural Level of consciousness: awake and alert and oriented Pain management: satisfactory to patient Vital Signs Assessment: post-procedure vital signs reviewed and stable Respiratory status: respiratory function stable Cardiovascular status: stable Postop Assessment: no backache, epidural receding, no headache, patient able to bend at knees, no apparent nausea or vomiting, able to ambulate and adequate PO intake Anesthetic complications: no     Last Vitals:  Vitals:   02/09/18 2308 02/10/18 0419  BP: 137/83 130/82  Pulse: (!) 108 90  Resp: 20 18  Temp: 37.7 C 36.9 C  SpO2: 96% 99%    Last Pain:  Vitals:   02/10/18 0643  TempSrc:   PainSc: 0-No pain                 Clydene PughBeane, Arrion Burruel D

## 2018-02-11 MED ORDER — VARICELLA VIRUS VACCINE LIVE 1350 PFU/0.5ML IJ SUSR
0.5000 mL | Freq: Once | INTRAMUSCULAR | Status: AC
  Administered 2018-02-11: 0.5 mL via SUBCUTANEOUS
  Filled 2018-02-11: qty 0.5

## 2018-02-11 MED ORDER — FERROUS SULFATE 325 (65 FE) MG PO TABS: 325.0000 mg | ORAL_TABLET | Freq: Two times a day (BID) | ORAL | 3 refills | Status: DC

## 2018-02-11 MED ORDER — NORETHINDRONE 0.35 MG PO TABS: 1.0000 | ORAL_TABLET | Freq: Every day | ORAL | 11 refills | Status: DC

## 2018-02-11 NOTE — Progress Notes (Signed)
Discharge instructions provided.  Pt and sig other verbalize understanding of all instructions and follow-up care.  Pt discharged to home with infant at 1545 on 02/11/18 via wheelchair by NT. Reynold BowenSusan Paisley Laekyn Rayos, RN 02/11/2018

## 2018-02-11 NOTE — Discharge Instructions (Signed)
After Your Delivery Discharge Instructions °  °Postpartum: Care Instructions ° °After childbirth (postpartum period), your body goes through many changes. Some of these changes happen over several weeks. In the hours after delivery, your body will begin to recover from childbirth while it prepares to breastfeed your newborn. You may feel emotional during this time. Your hormones can shift your mood without warning for no clear reason. ° °In the first couple of weeks after childbirth, many women have emotions that change from happy to sad. You may find it hard to sleep. You may cry a lot. This is called the "baby blues." These overwhelming emotions often go away within a couple of days or weeks. But it's important to discuss your feelings with your doctor. ° °You should call your care provider if you have unrelieved feelings of: °· Inability to cope °· Sadness °· Anxiety °· Lack of interest in baby °· Insomnia °· Crying ° °It is easy to get too tired and overwhelmed during the first weeks after childbirth. Don't try to do too much. Get rest whenever you can, accept help from others, and eat well and drink plenty of fluids. ° °About 4 to 6 weeks after your baby's birth, you will have a follow-up visit with your care provider. This visit is your time to talk to your provider about anything you are concerned or curious about. ° °Follow-up care is a key part of your treatment and safety. Be sure to make and go to all appointments, and call your doctor if you are having problems. It's also a good idea to know your test results and keep a list of the medicines you take. ° °How can you care for yourself at home? °· Sleep or rest when your baby sleeps. °· Get help with household chores from family or friends, if you can. Do not try to do it all yourself. °· If you have hemorrhoids or swelling or pain around the opening of your vagina, try using cold and heat. You can put ice or a cold pack on the area for 10 to 20 minutes at  a time. Put a thin cloth between the ice and your skin. Also try sitting in a few inches of warm water (sitz bath) 3 times a day and after bowel movements. °· Take pain medicines exactly as directed. °· If the provider gave you a prescription medicine for pain, take it as prescribed. °· If you do not have a prescription and need something over the counter, you can take: °· Ibuprofen (Motrin, Advil) up to 600mg every 6 hours as needed for pain °· Acetaminophen (Tylenol) up to 650mg every 4 hours as needed for pain °· Some people find it helpful to alternate between these two medications.  °· No driving for 1-2 weeks or while taking pain medications.  °· Eat more fiber to avoid constipation. Include foods such as whole-grain breads and cereals, raw vegetables, raw and dried fruits, and beans. °· Drink plenty of fluids, enough so that your urine is light yellow or clear like water. If you have kidney, heart, or liver disease and have to limit fluids, talk with your doctor before you increase the amount of fluids you drink. °· Do not put anything in the vagina for 6 weeks. This means no sex, no tampons, no douching, and no enemas. °· If you have stitches, keep the area clean by pouring or spraying warm water over the area outside your vagina and anus after you use the toilet. °·   No strenuous activity or heavy lifting for 6 weeks   No tub baths; showers only  Continue prenatal vitamin and iron.  If breastfeeding:  Increase calories and fluids while breastfeeding.  You may have a slight fever when your milk comes in, but it should go away on its own. If it does not, and rises above 101.0 please call the doctor.  For breastfeeding concerns, the lactation consultant can be reached at [phone].  For concerns about your baby, please call your pediatrician.   Keep a list of questions to bring to your postpartum visit. Your questions might be about:  Changes in your breasts, such as lumps or  soreness.  When to expect your menstrual period to start again.  What form of birth control is best for you.  Weight you have put on during the pregnancy.  Exercise options.  What foods and drinks are best for you, especially if you are breastfeeding.  Problems you might be having with breastfeeding.  When you can have sex. Some women may want to talk about lubricants for the vagina.  Any feelings of sadness or restlessness that you are having.   When should you call for help?  Call 911 anytime you think you may need emergency care. For example, call if:  You have thoughts of harming yourself, your baby, or another person.  You passed out (lost consciousness).  Call the office at [phone] or seek immediate medical care if:  If you have heavy bleeding such that you are soaking 1 pad in an hour for 2 hours  You are dizzy or lightheaded, or you feel like you may faint.  You have a fever; a temperature of 101.0 F or greater  Chills  Difficulty urinating  Headache unrelieved by "pain meds"   Visual changes  Pain in the right side of your belly near your ribs  Breasts reddened, hard, hot to the touch or any other breast concerns  Nipple discharge which is foul-smelling or contains pus   Increased pain at the site of the tear   New pain unrelieved with recommended over-the-counter dosages  Difficulty breathing with or without chest pain   New leg pain, swelling, or redness, especially if it is only on one leg  Any other concerns  Watch closely for changes in your health, and be sure to contact your provider if:  You have new or worse vaginal discharge.  You feel sad or depressed.  You are having problems with your breasts or breastfeeding.  Call your doctor for increased pain or vaginal bleeding, temperature above 100.4, depression, or concerns.  Increase calories and fluids while breastfeeding.  Continue prenatal vitamin and iron.  No strenuous  activity or heavy lifting for 6 weeks.  No intercourse, tampons, or douching for 6 weeks.  No tub baths- showers only.  No driving for 2 weeks.

## 2018-07-07 IMAGING — US US MFM OB COMPLETE +14 WKS
1 series · 12 of 28 positions shown · non-contrast
Comparison: none

PATIENT INFO:

PERFORMED BY:
MARIA AGNIESZKA
SERVICE(S) PROVIDED:
INDICATIONS:
14 weeks gestation of pregnancy
FETAL EVALUATION:
Num Of Fetuses:     1
Fetal Heart         157
Rate(bpm):
Cardiac Activity:   Present
Presentation:       Variable
Placenta:           Posterior
BIOMETRY:
CRL:        82  mm     G. Age:  14w 1d                  EDD:   02/15/18
GESTATIONAL AGE:
LMP:           13w 0d        Date:  05/19/17                 EDD:   02/23/18
Best:          14w 0d     Det. By:  Early Ultrasound         EDD:   02/16/18
(07/28/17)
1ST TRIMESTER GENETIC SONOGRAM SCREENING:
CRL:              82  mm    G. Age:   14w 1d                 EDD:   02/15/18
Nuc Trans:       1.5  mm

[Series 1: us mfm ob complete +14 wks · 0.08mm/px · 12 of 30 slices shown]
[im 2/30]
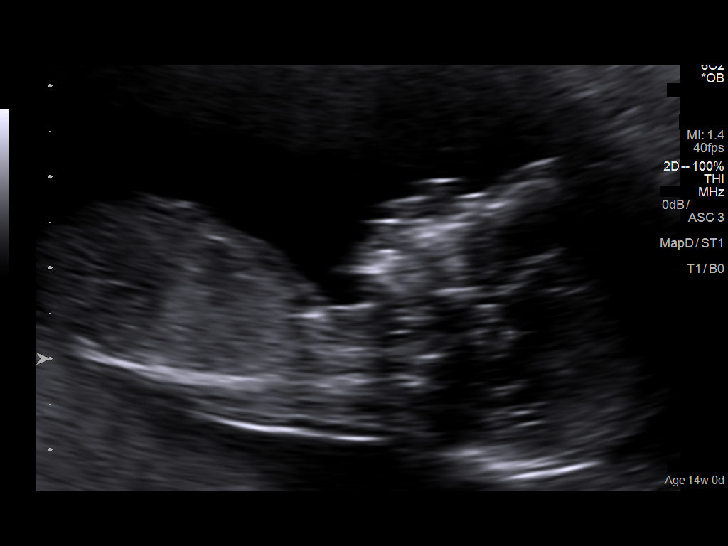
[im 4/30]
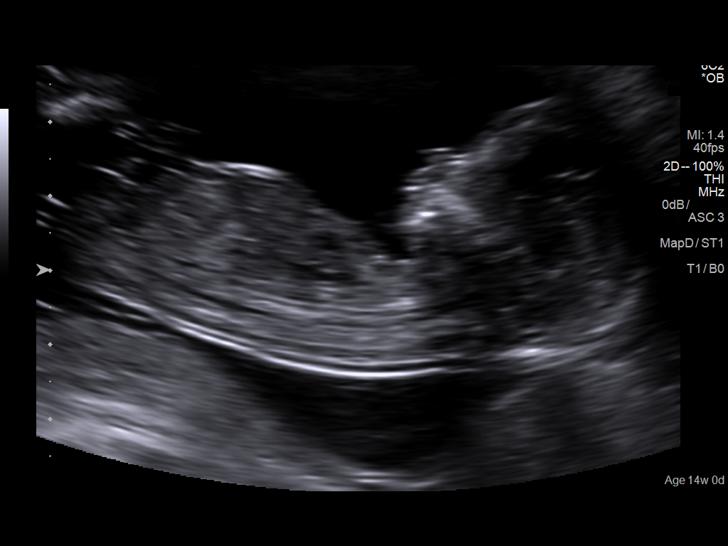
[im 6/30]
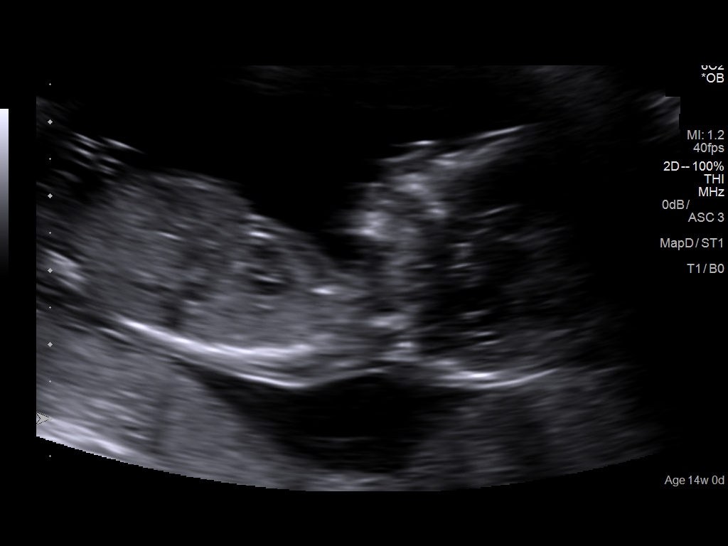
[im 9/30]
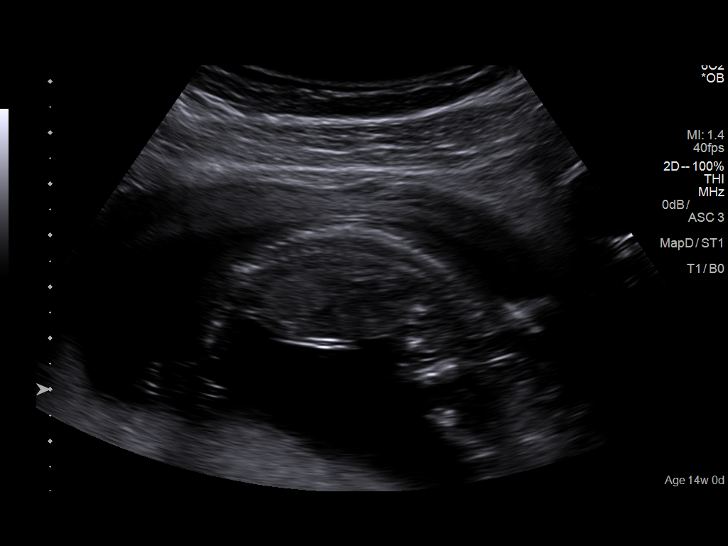
[im 11/30]
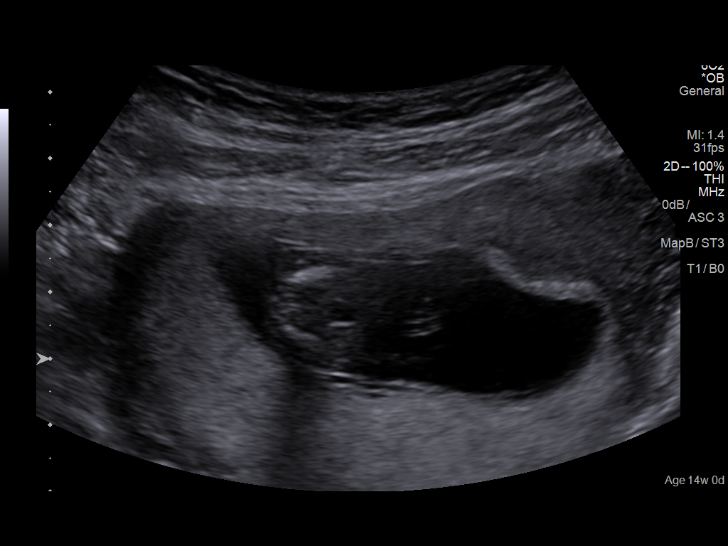
[im 13/30]
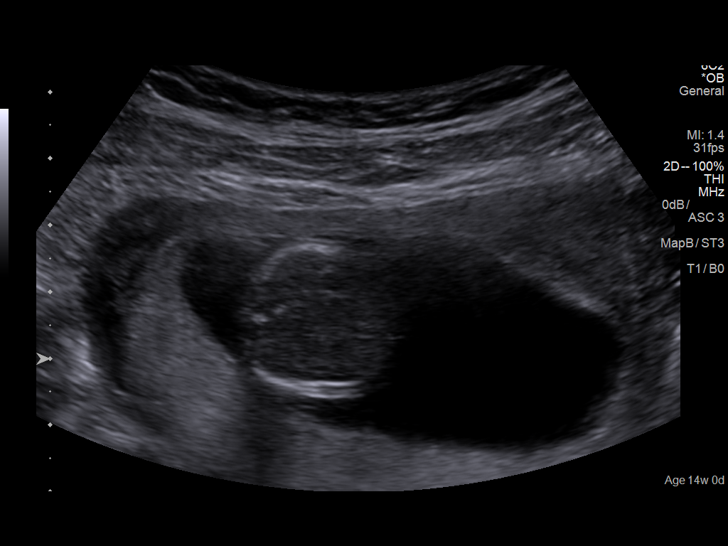
[im 17/30]
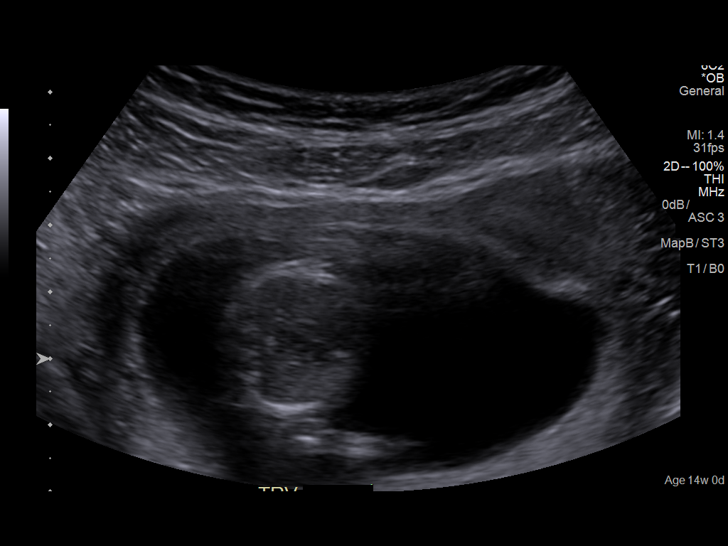
[im 19/30]
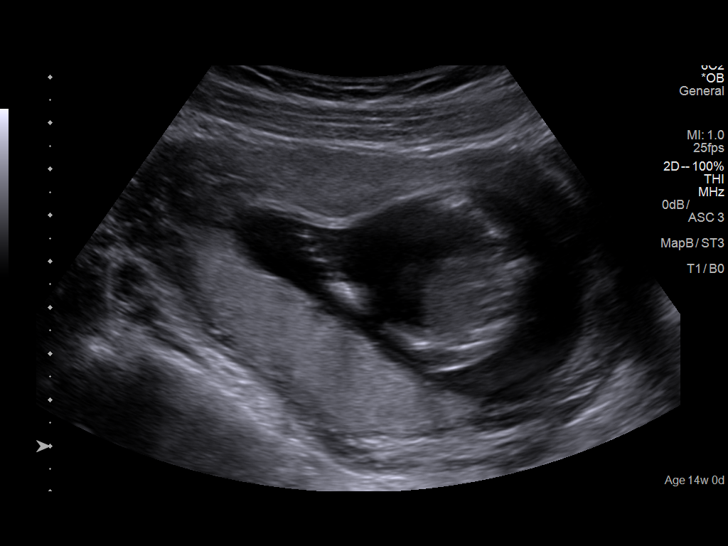
[im 21/30]
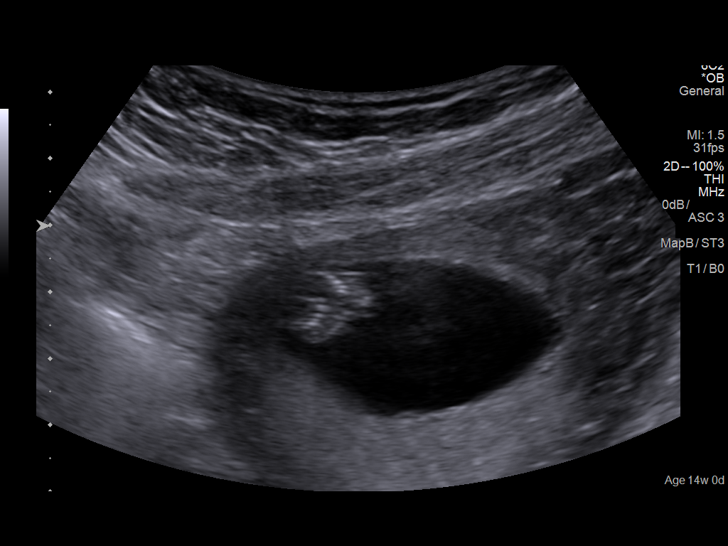
[im 24/30]
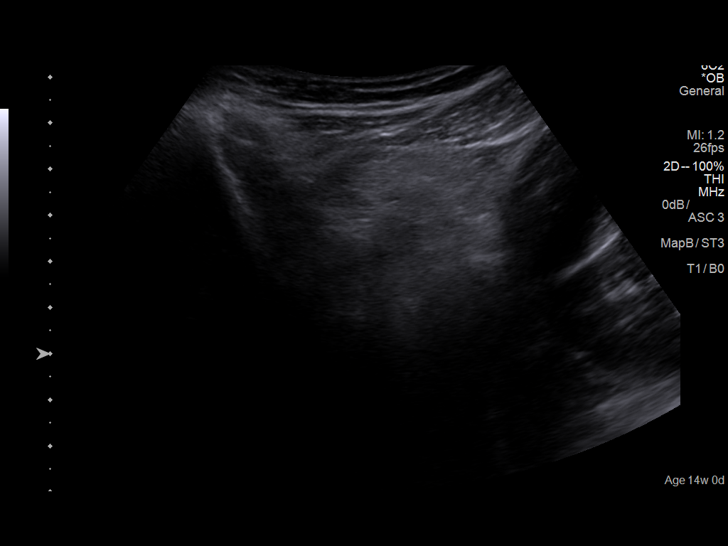
[im 26/30]
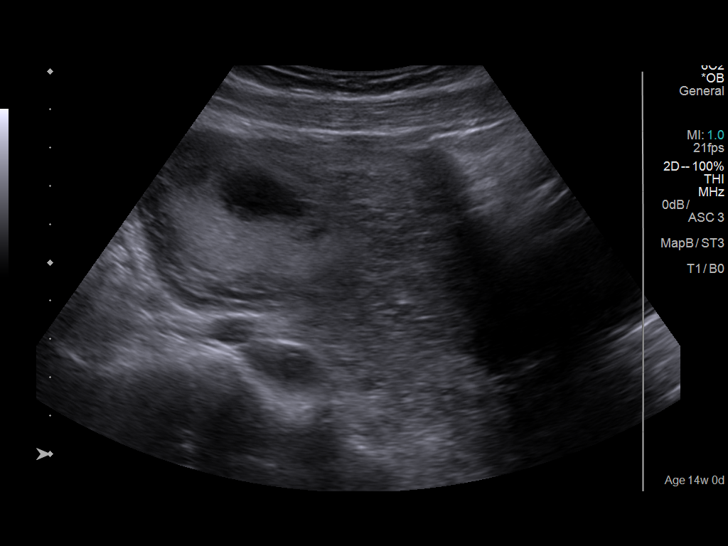
[im 28/30]
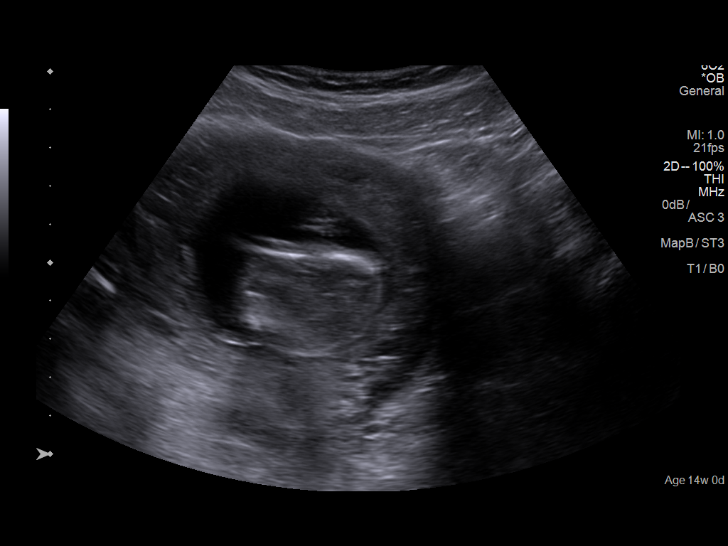

[12 of 28 positions shown; findings below may reference images not displayed]

IMPRESSION: Dear Dr. MARIA AGNIESZKA,

Thank you for referring your patient to [facility]
[HOSP[facility]] for the first trimester nuchal translucency
screening program.  Ms. Eng-Mustafa is 14 weeks 0 days based on
ultrasound at [facility] performed on 07/28/17 at which time
she was 11 weeks 0 days.  She reports irregular periods
therefore ultrasound dating used.  She met with our genetic
counselor and elected for first trimester screening.

A singleton gestation is noted.

The fetal biometry correlates with established dating.

The nuchal translucency measured 1.5 mm.

The fetal nasal bone was noted to be present.

The ovaries were WNL.

The patient was scheduled to have blood drawn for first
trimester screening.

A composite risk incorporating her age, nuchal translucency
measurement and blood results should be returned to your
office shortly ([facility]).

First trimester screening (Beta HCG/MYHRE/AFP/nuchal
translucency) to assess aneuploidy risk has been studied
prospectively in more than 100,000 women and appears to
be a reliable means for screening for Down syndrome,
trisomy 18, and trisomy 13.  Studies demonstrate that the
nasal bone is absent in approximately 65% of Down
syndrome cases and only 1% of unaffected cases in the first
trimester.  When added into the free Beta HCG/APA
MAXIMO/AFP/NT protocol, nasal bone increases Down syndrome
detection to 94% with a false positive rate of 5% (Cicero et al
Am J Obstet Gynecol 3220; 195: 109-14).   The only way to
exclude a diagnosis of aneuploidy is by invasive testing such
as chorionic villus sampling or amniocentesis.

Ms. Myeisha genetic counseling appointment revealed a
maternal history of mullarian anomaly (congenitally absent
right ovary and fallopian tube, unilateral kidney).  I briefly met
with Ms. Eng-Mustafa following her ultrasound and reviewed
increased risk of preterm birth and fetal malpresentation in
this setting.  I advised Ms. Eng-Mustafa that while she is at
increased risk for preterm birth, I do not recommend any
treatments at this time as there is no data to support any
therapy or procedures to decrease her risk.  I would
recommend routine screening cervical length with the
anatomy ultrasound.  I also recommend level II MFM
anatomy ultrasound.  We are happy to provide full MFM
consultation at the time of anatomy ultrasound if desired.
Please also see the genetic counseling note.

Thank you for allowing us to participate in your patient's care.

assistance.

## 2018-08-15 ENCOUNTER — Ambulatory Visit (INDEPENDENT_AMBULATORY_CARE_PROVIDER_SITE_OTHER): Payer: Self-pay | Admitting: Psychiatry

## 2018-08-15 ENCOUNTER — Encounter: Payer: Self-pay | Admitting: Psychiatry

## 2018-08-15 ENCOUNTER — Other Ambulatory Visit: Payer: Self-pay

## 2018-08-15 ENCOUNTER — Other Ambulatory Visit: Payer: Self-pay | Admitting: Psychiatry

## 2018-08-15 VITALS — BP 116/77 | HR 74 | Temp 97.4°F | Wt 158.2 lb

## 2018-08-15 DIAGNOSIS — Z87898 Personal history of other specified conditions: Secondary | ICD-10-CM | POA: Insufficient documentation

## 2018-08-15 DIAGNOSIS — F329 Major depressive disorder, single episode, unspecified: Secondary | ICD-10-CM

## 2018-08-15 DIAGNOSIS — F5105 Insomnia due to other mental disorder: Secondary | ICD-10-CM

## 2018-08-15 DIAGNOSIS — F411 Generalized anxiety disorder: Secondary | ICD-10-CM

## 2018-08-15 DIAGNOSIS — O99345 Other mental disorders complicating the puerperium: Secondary | ICD-10-CM

## 2018-08-15 MED ORDER — BUSPIRONE HCL 10 MG PO TABS: 10.0000 mg | ORAL_TABLET | Freq: Two times a day (BID) | ORAL | 0 refills | Status: DC

## 2018-08-15 NOTE — Progress Notes (Signed)
Psychiatric Initial Adult Assessment   Patient Identification: Melinda Mills MRN:  [id number] Date of Evaluation:  08/15/2018 Referral Source: Genia Del CNM Chief Complaint:  ' I am here to establish care.' Chief Complaint    Establish Care; Depression     Visit Diagnosis:    ICD-10-CM   1. MDD (major depressive disorder), single episode with postpartum onset O99.345 busPIRone (BUSPAR) 10 MG tablet   F32.9   2. GAD (generalized anxiety disorder) F41.1   3. Insomnia due to mental condition F51.05     History of Present Illness: Melinda Mills is a 28 year old Caucasian female, engaged, employed as a Engineer, site, lives in [place], has a history of depression and anxiety, peripartum onset, history of seizure disorder, presented to clinic today to establish care.  Patient reports she started struggling with depressive symptoms soon after the birth of her baby.  Her baby is currently 84 months old.  She describes her depressive symptoms as anhedonia, feeling sad, sleep problems, fatigue, appetite problems, feeling bad about herself, trouble concentrating and so on on a regular basis.  She reports she was started on Lexapro by her primary provider.  She reports she started taking a higher dosage of Lexapro end of January or beginning of February.  She is currently on 20 mg.  She reports that is helping her to some extent.  Patient also struggles with anxiety problems.  She describes her anxiety as worrying about the health of her baby.  She reports she worries about people holding her the baby too long, touching her baby and so on.  She reports she also had crying spells, episodes of feeling overwhelmed and so on on a regular basis soon after the birth of her baby.  She reports the Lexapro may be helping to some extent.  Patient reports sleep problems.  She reports she went without sleep for a while since she had to take care of the baby at night.  She reports soon after that she developed  irritability, would snap at little things.  She reports with the Lexapro sleep is getting a little bit better.  Patient reports psychosocial stressors of having a newborn, corona virus outbreak, relationship struggles with her fianc's mother.  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  Patient denies any history of trauma.  Patient denies any suicidality, homicidality or perceptual disturbances.    Patient reports some occasional use of cannabis, reports it helps her to sleep.  Patient denies abusing any other illicit drugs or alcohol or other substances.  Patient reports good social support system from her fianc.  Associated Signs/Symptoms: Depression Symptoms:  depressed mood, anhedonia, insomnia, psychomotor retardation, fatigue, feelings of worthlessness/guilt, difficulty concentrating, anxiety, (Hypo) Manic Symptoms:  denies Anxiety Symptoms:  Excessive Worry, Psychotic Symptoms:  denies PTSD Symptoms: Negative  Past Psychiatric History: Patient denies any inpatient mental health admissions.  Patient was recently started on Lexapro by her primary medical doctor.  Patient denies suicide attempts.  Previous Psychotropic Medications: Yes Lexapro, Zoloft-noncompliant  Substance Abuse History in the last 12 months:  No.  Consequences of Substance Abuse: Negative  Past Medical History:  Past Medical History:  Diagnosis Date  . ADHD (attention deficit hyperactivity disorder)   . Depression   . Seizures (HCC)     Past Surgical History:  Procedure Laterality Date  . APPENDECTOMY  2008/2009    Family Psychiatric History: Patient denies any history of mental health problems in her  family.  Family History:  Family History  Adopted: Yes    Social History:   Social History   Socioeconomic History  . Marital status: Significant Other    Spouse name: Not on file  . Number of children: 1  . Years of education: Not  on file  . Highest education level: Bachelor's degree (e.g., BA, AB, BS)  Occupational History  . Not on file  Social Needs  . Financial resource strain: Not very hard  . Food insecurity:    Worry: Never true    Inability: Never true  . Transportation needs:    Medical: No    Non-medical: No  Tobacco Use  . Smoking status: Never Smoker  . Smokeless tobacco: Never Used  Substance and Sexual Activity  . Alcohol use: Not Currently    Frequency: Never  . Drug use: Not Currently    Types: Marijuana  . Sexual activity: Yes  Lifestyle  . Physical activity:    Days per week: 0 days    Minutes per session: 0 min  . Stress: Not at all  Relationships  . Social connections:    Talks on phone: More than three times a week    Gets together: More than three times a week    Attends religious service: 1 to 4 times per year    Active member of club or organization: No    Attends meetings of clubs or organizations: Never    Relationship status: Living with partner  Other Topics Concern  . Not on file  Social History Narrative  . Not on file    Additional Social History: Patient is engaged.  She lives with her fianc and 13-month-old newborn baby in [place].  Patient is a Chartered loss adjuster.  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  Patient was raised by her father and stepmom.  Patient is estranged from her biological mother.  Allergies:  No Known Allergies  Metabolic Disorder Labs: No results found for: HGBA1C, MPG No results found for: PROLACTIN No results found for: CHOL, TRIG, HDL, CHOLHDL, VLDL, LDLCALC No results found for: TSH  Therapeutic Level Labs: No results found for: LITHIUM No results found for: CBMZ No results found for: VALPROATE  Current Medications: Current Outpatient Medications  Medication Sig Dispense Refill  . escitalopram (LEXAPRO) 20 MG tablet Take by mouth.    . Norgestimate-Ethinyl Estradiol Triphasic 0.18/0.215/0.25 MG-35 MCG tablet Take by mouth.    .  busPIRone (BUSPAR) 10 MG tablet Take 1 tablet (10 mg total) by mouth 2 (two) times daily. 60 tablet 0   No current facility-administered medications for this visit.     Musculoskeletal: Strength & Muscle Tone: within normal limits Gait & Station: normal Patient leans: N/A  Psychiatric Specialty Exam: Review of Systems  Psychiatric/Behavioral: Positive for depression. The patient is nervous/anxious and has insomnia.   All other systems reviewed and are negative.   Blood pressure 116/77, pulse 74, temperature (!) 97.4 F (36.3 C), temperature source Oral, weight 158 lb 3.2 oz (71.8 kg), not currently breastfeeding.Body mass index is 28.02 kg/m.  General Appearance: Casual  Eye Contact:  Fair  Speech:  Clear and Coherent  Volume:  Normal  Mood:  Anxious and Depressed  Affect:  Appropriate  Thought Process:  Goal Directed and Descriptions of Associations: Intact  Orientation:  Full (Time, Place, and Person)  Thought Content:  Logical  Suicidal Thoughts:  No  Homicidal Thoughts:  No  Memory:  Immediate;   Fair Recent;  Fair Remote;   Fair  Judgement:  Fair  Insight:  Fair  Psychomotor Activity:  Normal  Concentration:  Concentration: Fair and Attention Span: Fair  Recall:  [employer] of Knowledge:Fair  Language: Fair  Akathisia:  No  Handed:  Right  AIMS (if indicated): denies tremors, rigidity,stiffness  Assets:  Communication Skills Desire for Improvement Social Support  ADL's:  Intact  Cognition: WNL  Sleep:  improving   Screenings:   Assessment and Plan: Elisabet is a 28 year old Caucasian female, engaged, employed, lives in [place], has a history of anxiety, seizure disorder, presented to clinic today to establish care.  Patient has psychosocial stressors of having a newborn, upcoming wedding and relationship struggles.  Patient will benefit from medication management as well as psychotherapy sessions.  Patient has good social support system.  Plan as noted  below.  Plan MDD, single episode peripartum onset- unstable PHQ 9 equals 16 Lexapro 20 mg p.o. daily Add BuSpar 10 mg p.o. twice daily Refer for CBT  For GAD-unstable GAD 7 equals 16 Add BuSpar 10 mg p.o. twice daily Refer for CBT  Will order labs-TSH.  Follow-up in clinic in 2 weeks or sooner if needed.  I have spent atleast 60 minutes face to face with patient today. More than 50 % of the time was spent for psychoeducation and supportive psychotherapy and care coordination.  This note was generated in part or whole with voice recognition software. Voice recognition is usually quite accurate but there are transcription errors that can and very often do occur. I apologize for any typographical errors that were not detected and corrected.           Jomarie Longs, MD 3/24/202012:50 PM

## 2018-08-15 NOTE — Patient Instructions (Signed)
Buspirone tablets What is this medicine? BUSPIRONE (byoo SPYE rone) is used to treat anxiety disorders. This medicine may be used for other purposes; ask your health care provider or pharmacist if you have questions. COMMON BRAND NAME(S): BuSpar What should I tell my health care provider before I take this medicine? They need to know if you have any of these conditions: -kidney or liver disease -an unusual or allergic reaction to buspirone, other medicines, foods, dyes, or preservatives -pregnant or trying to get pregnant -breast-feeding How should I use this medicine? Take this medicine by mouth with a glass of water. Follow the directions on the prescription label. You may take this medicine with or without food. To ensure that this medicine always works the same way for you, you should take it either always with or always without food. Take your doses at regular intervals. Do not take your medicine more often than directed. Do not stop taking except on the advice of your doctor or health care professional. Talk to your pediatrician regarding the use of this medicine in children. Special care may be needed. Overdosage: If you think you have taken too much of this medicine contact a poison control center or emergency room at once. NOTE: This medicine is only for you. Do not share this medicine with others. What if I miss a dose? If you miss a dose, take it as soon as you can. If it is almost time for your next dose, take only that dose. Do not take double or extra doses. What may interact with this medicine? Do not take this medicine with any of the following medications: -linezolid -MAOIs like Carbex, Eldepryl, Marplan, Nardil, and Parnate -methylene blue -procarbazine This medicine may also interact with the following medications: -diazepam -digoxin -diltiazem -erythromycin -grapefruit juice -haloperidol -medicines for mental depression or mood problems -medicines for seizures like  carbamazepine, phenobarbital and phenytoin -nefazodone -other medications for anxiety -rifampin -ritonavir -some antifungal medicines like itraconazole, ketoconazole, and voriconazole -verapamil -warfarin This list may not describe all possible interactions. Give your health care provider a list of all the medicines, herbs, non-prescription drugs, or dietary supplements you use. Also tell them if you smoke, drink alcohol, or use illegal drugs. Some items may interact with your medicine. What should I watch for while using this medicine? Visit your doctor or health care professional for regular checks on your progress. It may take 1 to 2 weeks before your anxiety gets better. You may get drowsy or dizzy. Do not drive, use machinery, or do anything that needs mental alertness until you know how this drug affects you. Do not stand or sit up quickly, especially if you are an older patient. This reduces the risk of dizzy or fainting spells. Alcohol can make you more drowsy and dizzy. Avoid alcoholic drinks. What side effects may I notice from receiving this medicine? Side effects that you should report to your doctor or health care professional as soon as possible: -blurred vision or other vision changes -chest pain -confusion -difficulty breathing -feelings of hostility or anger -muscle aches and pains -numbness or tingling in hands or feet -ringing in the ears -skin rash and itching -vomiting -weakness Side effects that usually do not require medical attention (report to your doctor or health care professional if they continue or are bothersome): -disturbed dreams, nightmares -headache -nausea -restlessness or nervousness -sore throat and nasal congestion -stomach upset This list may not describe all possible side effects. Call your doctor for medical advice about side   effects. You may report side effects to FDA at 1-800-FDA-1088. Where should I keep my medicine? Keep out of the reach  of children. Store at room temperature below 30 degrees C (86 degrees F). Protect from light. Keep container tightly closed. Throw away any unused medicine after the expiration date. NOTE: This sheet is a summary. It may not cover all possible information. If you have questions about this medicine, talk to your doctor, pharmacist, or health care provider.  2019 Elsevier/Gold Standard (2009-12-18 18:06:11)  

## 2018-08-16 LAB — TSH: TSH: 3.64 u[IU]/mL (ref 0.450–4.500)

## 2018-08-21 ENCOUNTER — Ambulatory Visit: Payer: Self-pay | Admitting: Licensed Clinical Social Worker

## 2018-08-31 ENCOUNTER — Telehealth (INDEPENDENT_AMBULATORY_CARE_PROVIDER_SITE_OTHER): Payer: BC Managed Care – PPO | Admitting: Psychiatry

## 2018-08-31 ENCOUNTER — Other Ambulatory Visit: Payer: Self-pay

## 2018-08-31 ENCOUNTER — Encounter: Payer: Self-pay | Admitting: Psychiatry

## 2018-08-31 DIAGNOSIS — F5105 Insomnia due to other mental disorder: Secondary | ICD-10-CM | POA: Diagnosis not present

## 2018-08-31 DIAGNOSIS — O99345 Other mental disorders complicating the puerperium: Principal | ICD-10-CM

## 2018-08-31 DIAGNOSIS — F411 Generalized anxiety disorder: Secondary | ICD-10-CM

## 2018-08-31 DIAGNOSIS — F329 Major depressive disorder, single episode, unspecified: Secondary | ICD-10-CM | POA: Diagnosis not present

## 2018-08-31 MED ORDER — BUSPIRONE HCL 10 MG PO TABS: 10.0000 mg | ORAL_TABLET | Freq: Two times a day (BID) | ORAL | 1 refills | Status: DC

## 2018-08-31 NOTE — Progress Notes (Signed)
TC on  08-31-18 @ 10:53 medications and pharmacy were reviewed with no changes. No allergies to medications, medical and surgical hx were reviewed and updated. Pt vitals were not taken because this is a phone consult.

## 2018-08-31 NOTE — Progress Notes (Signed)
Virtual Visit via Telephone Note  I connected with Melinda Mills on 08/31/18 at 11:30 AM EDT by telephone and verified that I am speaking with the correct person using two identifiers.   I discussed the limitations, risks, security and privacy concerns of performing an evaluation and management service by telephone and the availability of in person appointments. I also discussed with the patient that there may be a patient responsible charge related to this service. The patient expressed understanding and agreed to proceed.   I discussed the assessment and treatment plan with the patient. The patient was provided an opportunity to ask questions and all were answered. The patient agreed with the plan and demonstrated an understanding of the instructions.   The patient was advised to call back or seek an in-person evaluation if the symptoms worsen or if the condition fails to improve as anticipated.  I provided 15 minutes of non-face-to-face time during this encounter.   Jomarie Longs, MD  BH MD  OP Progress Note  08/31/2018 11:50 AM Melinda Mills  MRN:  [id number]  Chief Complaint:  Chief Complaint    Follow-up; Medication Refill     HPI:  Melinda Mills is a 28 year old Caucasian female, engaged, employed as a Chartered loss adjuster, lives in [place], has a history of depression, anxiety, insomnia, peripartum onset, history of seizure disorder was evaluated by phone today.  Patient today reports that because of the COVID-19 crisis she has been staying in door mostly.  She has to work and also do her schoolwork from home.  She reports she also has to take care of her 50-month-old baby which is a full-time job.  Patient reports she is worried about sending the baby to daycare since she does not know if anyone there has been exposed to coronavirus or not.  She wants to be safe.  She is hoping her fianc's mother will be available to help her with her baby.  Patient reports she has noticed improvement  in her depression and anxiety symptoms since her last visit.  She is worried about the COVID-19 crisis however otherwise has been doing okay.  She reports the Lexapro and the BuSpar has been helpful.  She noticed some sleepiness when she took the BuSpar initially however she has gotten used to it now and it does not cause her any side effects anymore.  She is not interested in readjusting her dosage today and wants to stay on the current dose for now.  Patient reports she has been able to sleep better.  Patient denies any suicidality, homicidality or perceptual disturbances.  Visit Diagnosis:    ICD-10-CM   1. MDD (major depressive disorder), single episode with postpartum onset O99.345 busPIRone (BUSPAR) 10 MG tablet   F32.9   2. GAD (generalized anxiety disorder) F41.1   3. Insomnia due to mental condition F51.05     Past Psychiatric History: Reviewed past psychiatric history from my progress note on 08/15/2018.  Past trials of Zoloft-noncompliant, Lexapro.  Past Medical History:  Past Medical History:  Diagnosis Date  . ADHD (attention deficit hyperactivity disorder)   . Depression   . Seizures (HCC)     Past Surgical History:  Procedure Laterality Date  . APPENDECTOMY  2008/2009    Family Psychiatric History: Reviewed family psychiatric history from my progress note on 08/15/2018.  Family History:  Family History  Adopted: Yes    Social History: Reviewed social history from my progress note on 08/15/2018. Social History   Socioeconomic History  . Marital  status: Significant Other    Spouse name: Not on file  . Number of children: 1  . Years of education: Not on file  . Highest education level: Bachelor's degree (e.g., BA, AB, BS)  Occupational History  . Not on file  Social Needs  . Financial resource strain: Not very hard  . Food insecurity:    Worry: Never true    Inability: Never true  . Transportation needs:    Medical: No    Non-medical: No  Tobacco Use  .  Smoking status: Never Smoker  . Smokeless tobacco: Never Used  Substance and Sexual Activity  . Alcohol use: Not Currently    Frequency: Never  . Drug use: Not Currently    Types: Marijuana  . Sexual activity: Yes  Lifestyle  . Physical activity:    Days per week: 0 days    Minutes per session: 0 min  . Stress: Not at all  Relationships  . Social connections:    Talks on phone: More than three times a week    Gets together: More than three times a week    Attends religious service: 1 to 4 times per year    Active member of club or organization: No    Attends meetings of clubs or organizations: Never    Relationship status: Living with partner  Other Topics Concern  . Not on file  Social History Narrative  . Not on file    Allergies: No Known Allergies  Metabolic Disorder Labs: No results found for: HGBA1C, MPG No results found for: PROLACTIN No results found for: CHOL, TRIG, HDL, CHOLHDL, VLDL, LDLCALC Lab Results  Component Value Date   TSH 3.640 08/15/2018    Therapeutic Level Labs: No results found for: LITHIUM No results found for: VALPROATE No components found for:  CBMZ  Current Medications: Current Outpatient Medications  Medication Sig Dispense Refill  . busPIRone (BUSPAR) 10 MG tablet Take 1 tablet (10 mg total) by mouth 2 (two) times daily. 60 tablet 1  . escitalopram (LEXAPRO) 20 MG tablet Take by mouth.    . Norgestimate-Ethinyl Estradiol Triphasic 0.18/0.215/0.25 MG-35 MCG tablet Take by mouth.     No current facility-administered medications for this visit.      Musculoskeletal: Strength & Muscle Tone: UTA Gait & Station: UTA Patient leans: N/A  Psychiatric Specialty Exam: Review of Systems  Psychiatric/Behavioral: The patient is nervous/anxious.   All other systems reviewed and are negative.   Last menstrual period 08/24/2018, not currently breastfeeding.There is no height or weight on file to calculate BMI.  General Appearance: UTA   Eye Contact:  UTA  Speech:  Clear and Coherent  Volume:  Normal  Mood:  Anxious and Depressed  Affect:  UTA  Thought Process:  Goal Directed and Descriptions of Associations: Intact  Orientation:  Full (Time, Place, and Person)  Thought Content: Logical   Suicidal Thoughts:  No  Homicidal Thoughts:  No  Memory:  Immediate;   Fair Recent;   Fair Remote;   Fair  Judgement:  Fair  Insight:  Fair  Psychomotor Activity:  Normal  Concentration:  Concentration: Fair and Attention Span: Fair  Recall:  [employer] of Knowledge: Fair  Language: Fair  Akathisia:  No  Handed:  Right  AIMS (if indicated): Denies tremors, rigidity.stiffness  Assets:  Communication Skills Desire for Improvement Social Support  ADL's:  Intact  Cognition: WNL  Sleep:  improving   Screenings:   Assessment and Plan: Kamylle is a  28 yr old CF, engaged, employed, lives in [place], has a history of depression, anxiety, insomnia- peripartum onset, history of seizure disorder was evaluated by phone today.  Patient reports she is currently making progress on the current medication regimen.  She does struggle with psychosocial stressors of the current COVID-19 crisis.  She however has been making use of her support system.  Patient also has upcoming appointment with therapist.  Plan as discussed below.  MDD- improving Lexapro 20 mg p.o. daily BuSpar 10 mg p.o. twice daily. Patient to start CBT sessions with Ms. Heidi DachKelsey Craig on 09/01/2018.  For GAD-improving Lexapro and BuSpar as prescribed.  Insomnia- improving Patient will continue to make use of sleep hygiene techniques.  Reviewed and discussed TSH-dated 08/15/2018-within normal limits.  Follow-up in clinic in 1 month or sooner if needed.  I have spent atleast 15 minutes non face to face with patient today. More than 50 % of the time was spent for psychoeducation and supportive psychotherapy and care coordination.  This note was generated in part or  whole with voice recognition software. Voice recognition is usually quite accurate but there are transcription errors that can and very often do occur. I apologize for any typographical errors that were not detected and corrected.          Jomarie LongsSaramma Saidi Santacroce, MD 08/31/2018, 11:50 AM

## 2018-09-01 ENCOUNTER — Ambulatory Visit (INDEPENDENT_AMBULATORY_CARE_PROVIDER_SITE_OTHER): Payer: BC Managed Care – PPO | Admitting: Licensed Clinical Social Worker

## 2018-09-01 ENCOUNTER — Encounter: Payer: Self-pay | Admitting: Licensed Clinical Social Worker

## 2018-09-01 DIAGNOSIS — F329 Major depressive disorder, single episode, unspecified: Secondary | ICD-10-CM

## 2018-09-01 DIAGNOSIS — O99345 Other mental disorders complicating the puerperium: Secondary | ICD-10-CM | POA: Diagnosis not present

## 2018-09-01 DIAGNOSIS — F411 Generalized anxiety disorder: Secondary | ICD-10-CM | POA: Diagnosis not present

## 2018-09-01 NOTE — Progress Notes (Signed)
Comprehensive Clinical Assessment (CCA) Note  09/01/2018 Melinda Mills [id number]  Visit Diagnosis:      ICD-10-CM   1. MDD (major depressive disorder), single episode with postpartum onset O99.345    F32.9   2. GAD (generalized anxiety disorder) F41.1       CCA Part One  Part One has been completed on paper by the patient.  (See scanned document in Chart Review)  CCA Part Two A  Intake/Chief Complaint:  CCA Intake With Chief Complaint CCA Part Two Date: 09/01/18 CCA Part Two Time: 1054 Chief Complaint/Presenting Problem: "I was having some post partem type things. I was having anxiety, which started before I gave birth and got worse as it got closer to birth. After I had my son, it got worse because he was there. And, depression kind of snuck up."  Patients Currently Reported Symptoms/Problems: "Anxiety, mainly around my son. I was feeling kind of isolated because I'm from [place] and all my family is there. The medicine has helped a lot."  Collateral Involvement: N/A Individual's Strengths: "I'm pretty determined."  Individual's Preferences: N/A Individual's Abilities: good communication  Type of Services Patient Feels Are Needed: individual therapy, medication management  Initial Clinical Notes/Concerns: N/A  Mental Health Symptoms Depression:  Depression: Difficulty Concentrating, Change in energy/activity, Fatigue, Tearfulness, Irritability, Hopelessness, Weight gain/loss  Mania:  Mania: N/A  Anxiety:   Anxiety: Worrying, Difficulty concentrating, Irritability, Fatigue, Tension, Sleep, Restlessness  Psychosis:  Psychosis: N/A  Trauma:  Trauma: N/A  Obsessions:  Obsessions: N/A  Compulsions:  Compulsions: N/A  Inattention:  Inattention: N/A  Hyperactivity/Impulsivity:  Hyperactivity/Impulsivity: N/A  Oppositional/Defiant Behaviors:  Oppositional/Defiant Behaviors: N/A  Borderline Personality:  Emotional Irregularity: N/A  Other Mood/Personality Symptoms:      Mental Status  Exam Appearance and self-care  Stature:  Stature: Average  Weight:  Weight: Average weight  Clothing:  Clothing: Neat/clean  Grooming:  Grooming: Normal  Cosmetic use:  Cosmetic Use: None  Posture/gait:  Posture/Gait: Normal  Motor activity:  Motor Activity: Not Remarkable  Sensorium  Attention:  Attention: Normal  Concentration:  Concentration: Normal  Orientation:  Orientation: X5  Recall/memory:  Recall/Memory: Normal  Affect and Mood  Affect:  Affect: Anxious  Mood:  Mood: Anxious  Relating  Eye contact:  Eye Contact: Normal  Facial expression:  Facial Expression: Anxious  Attitude toward examiner:  Attitude Toward Examiner: Cooperative  Thought and Language  Speech flow: Speech Flow: Normal  Thought content:  Thought Content: Appropriate to mood and circumstances  Preoccupation:     Hallucinations:     Organization:     Transport planner of Knowledge:  Fund of Knowledge: Average  Intelligence:  Intelligence: Average  Abstraction:  Abstraction: Normal  Judgement:  Judgement: Normal  Reality Testing:  Reality Testing: Realistic  Insight:  Insight: Good  Decision Making:  Decision Making: Normal  Social Functioning  Social Maturity:  Social Maturity: Responsible  Social Judgement:  Social Judgement: Normal  Stress  Stressors:  Stressors: Transitions  Coping Ability:  Coping Ability: Normal  Skill Deficits:     Supports:      Family and Psychosocial History: Family history Marital status: Other (comment)(engaged) Are you sexually active?: Yes What is your sexual orientation?: heterosexual  Has your sexual activity been affected by drugs, alcohol, medication, or emotional stress?: N/A Does patient have children?: Yes How many children?: 1 How is patient's relationship with their children?: 1 son, age 26.5 months: "Good."   Childhood History:  Childhood History By whom  was/is the patient raised?: Mother/father and step-parent Additional childhood history  information: "Up until I was 4 or 5, it was my dad and his mother because my birth mom didn't have anything to do with me. She says she wanted me but she didn't. Then, my dad met the person I call mom.. Then they got divorced when I was in middle school."  Description of patient's relationship with caregiver when they were a child: Mom: No relationship with biological mom. "the first time I ever talked to her was sophomore year of college."  Dad: "It was really good. We were pretty close. He had me young. He was only 19."    Patient's description of current relationship with people who raised him/her: Mom: No relationship. Dad: "Good. A few years ago he moved to [place] with my step mom. And, now they've moved back."  How were you disciplined when you got in trouble as a child/adolescent?: "Normal stuff."  Does patient have siblings?: Yes Number of Siblings: 4 Description of patient's current relationship with siblings: 2 half sisters, 2 step brothers : "I don't really talk to my older sister. We kind of stopped seeing each other. My younger sister and I are best freinds. My two step brothers, we're fine. 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 [employer] long has patient been employed?: 2 years  Patient's job has been impacted by current illness: (N/A) What is the longest time patient has a held a job?: current job Where was the patient employed at that time?: current job  Did You  Receive Any Psychiatric Treatment/Services While in Passenger transport manager?: (N/A) Are There Guns or Other Weapons in [place]?: Yes Types of Guns/Weapons: rifle  Are These Psychologist, educational?: Yes(locked in safe)  Education: Education School Currently Attending: n/a Last Grade Completed: 16 Name of [employer]: HS in [place] Did You Graduate From [employer]?: Yes Did Physicist, medical?: Yes What Type of College Degree Do you Have?: BA Anthropology  Did Kathleen?: No What Was Your Major?: Anthropology  Did You Have Any Special Interests In School?: N/A Did You Have An Individualized Education Program (IIEP): No Did You Have Any Difficulty At School?: No  Religion: Religion/Spirituality Are You A Religious Person?: No How Might This Affect Treatment?: N/A  Leisure/Recreation: Leisure / Recreation Leisure and Hobbies: sports, being outside   Exercise/Diet: Exercise/Diet Do You Exercise?: Yes What Type of Exercise Do You Do?: Run/Walk How Many Times a Week Do You Exercise?: 4-5 times a week Have You Gained or Lost A Significant Amount of Weight in the Past Six Months?: No Do You Follow a Special Diet?: No Do You Have Any Trouble Sleeping?: No  CCA Part Two C  Alcohol/Drug Use: Alcohol / Drug Use Pain Medications: SEE MAR Prescriptions: SEE MAR Over the Counter: SEE MAR History of alcohol / drug use?: No history of alcohol / drug abuse  CCA Part Three  ASAM's:  Six Dimensions of Multidimensional Assessment  Dimension 1:  Acute Intoxication and/or Withdrawal Potential:     Dimension 2:  Biomedical Conditions and Complications:     Dimension 3:  Emotional, Behavioral, or Cognitive Conditions and Complications:     Dimension 4:  Readiness to Change:     Dimension 5:  Relapse, Continued use, or Continued Problem Potential:     Dimension 6:  Recovery/Living Environment:      Substance use Disorder (SUD)    Social Function:   Social Functioning Social Maturity: Responsible Social Judgement: Normal  Stress:  Stress Stressors: Transitions Coping Ability: Normal Patient Takes Medications The Way The Doctor Instructed?: Yes Priority Risk: Low Acuity  Risk Assessment- Self-Harm Potential: Risk Assessment For Self-Harm Potential Thoughts of Self-Harm: No current thoughts Method: No plan Availability of Means: No access/NA Additional Comments for Self-Harm Potential: Hx of SI, "not since I've had my son. When I was younger, yeah. I felt guilty with an idea of feeling like I ruined my dad's life."   Risk Assessment -Dangerous to Others Potential: Risk Assessment For Dangerous to Others Potential Method: No Plan Availability of Means: No access or NA Intent: Vague intent or NA Notification Required: No need or identified person Additional Comments for Danger to Others Potential: N/A  DSM5 Diagnoses: Patient Active Problem List   Diagnosis Date Noted  . History of seizure 08/15/2018  . Labor and delivery indication for care or intervention 02/09/2018  . Congenital renal agenesis, unilateral   . Family history of Huntington's disease   . Ovary absent   . High-risk pregnancy 07/28/2017  . 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 Panic attack 09/06/2011  . Major depressive disorder, single episode, severe ([place]) 08/20/2011    Patient Centered Plan: Patient is on the following Treatment Plan(s):  Anxiety  Recommendations for Services/Supports/Treatments: Recommendations for Services/Supports/Treatments Recommendations For Services/Supports/Treatments: Individual Therapy, Medication Management  Treatment Plan Summary:    Referrals to Alternative Service(s): Referred to Alternative Service(s):   Place:   Date:   Time:    Referred to Alternative Service(s):   Place:   Date:   Time:    Referred to Alternative Service(s):   Place:   Date:   Time:    Referred to Alternative Service(s):   Place:   Date:    Time:     [name]

## 2018-09-15 ENCOUNTER — Other Ambulatory Visit: Payer: Self-pay

## 2018-09-15 ENCOUNTER — Encounter: Payer: Self-pay | Admitting: Licensed Clinical Social Worker

## 2018-09-15 ENCOUNTER — Ambulatory Visit (INDEPENDENT_AMBULATORY_CARE_PROVIDER_SITE_OTHER): Payer: BC Managed Care – PPO | Admitting: Licensed Clinical Social Worker

## 2018-09-15 DIAGNOSIS — O99345 Other mental disorders complicating the puerperium: Secondary | ICD-10-CM

## 2018-09-15 DIAGNOSIS — F329 Major depressive disorder, single episode, unspecified: Secondary | ICD-10-CM

## 2018-09-15 DIAGNOSIS — F411 Generalized anxiety disorder: Secondary | ICD-10-CM | POA: Diagnosis not present

## 2018-09-15 NOTE — Progress Notes (Signed)
Virtual Visit via Telephone Note  I connected with Melinda Mills on 09/15/18 at 11:00 AM EDT by telephone and verified that I am speaking with the correct person using two identifiers.   I discussed the limitations, risks, security and privacy concerns of performing an evaluation and management service by telephone and the availability of in person appointments. I also discussed with the patient that there may be a patient responsible charge related to this service. The patient expressed understanding and agreed to proceed.    I discussed the assessment and treatment plan with the patient. The patient was provided an opportunity to ask questions and all were answered. The patient agreed with the plan and demonstrated an understanding of the instructions.   The patient was advised to call back or seek an in-person evaluation if the symptoms worsen or if the condition fails to improve as anticipated.  I provided 50 minutes of non-face-to-face time during this encounter.   [name], Melinda Mills    THERAPIST PROGRESS NOTE  Session Time: 1100  Participation Level: Active  Behavioral Response: NAAlertAnxious  Type of Therapy: Individual Therapy  Treatment Goals addressed: Anxiety  Interventions: Solution Focused  Summary: Melinda Mills is a 28 y.o. female who presents with continued symptoms of her diagnosis. Melinda Mills reports doing well since our last session, and reports her anxiety has been manageable. She reports having some problems with her communication in her relationship. She reports, "I'll ask my fiance to do something, and he'll forget to do it or say he'll do it the next day, but it never gets done." Melinda Mills reported beginning to feel resentful when her fiance does not do the things she's asked him to do around the house, or if he does not communicate when he plans to do something--so, she reports she often ends up completing the task to decrease her own anxiety around it not being  done. Melinda Mills provided several examples to illustarte this point, and stated she feels it is implied when the task should be completed. Melinda Mills encouraged Melinda Mills to look at what parts of her expectations she is communicating and what parts she is leaving open for interpretation. Melinda Mills encouraged Melinda Mills to utilize more clear communication in order to facilitate understanding on both parts, and to check in with her fiance about communicating and her intentions behind providing additional information. Melinda Mills expressed understanding and agreement with this idea, and stated she will try that this weekend. Melinda Mills also encouraged Melinda Mills to begin having check ins with her fiance when they are not arguing, in order to facilitate more effective communication within their relationship. Melinda Mills expressed understanding and agreement with this idea as well. This led to a discussion around validation and empathetic listening within the boundaries of relationships, and how communication can make or break relationships so it is better to develop healthy patterns towards the beginning of a relationship rather than break old habits later on. Melinda Mills expressed agreement with this notion as well.   Suicidal/Homicidal: No  Therapist Response: Melinda Mills continues to work towards her treatment goals but has not yet reached them. She will continue to work on improving communication in order to facilitate less resentment and anxiety in her relationship.   Plan: Return again in 2 weeks.  Diagnosis: Axis I: Generalized Anxiety Disorder    Axis II: No diagnosis    [name], Melinda Mills 09/15/2018

## 2018-09-28 ENCOUNTER — Ambulatory Visit (INDEPENDENT_AMBULATORY_CARE_PROVIDER_SITE_OTHER): Payer: BC Managed Care – PPO | Admitting: Licensed Clinical Social Worker

## 2018-09-28 ENCOUNTER — Encounter: Payer: Self-pay | Admitting: Licensed Clinical Social Worker

## 2018-09-28 ENCOUNTER — Other Ambulatory Visit: Payer: Self-pay

## 2018-09-28 DIAGNOSIS — O99345 Other mental disorders complicating the puerperium: Secondary | ICD-10-CM | POA: Diagnosis not present

## 2018-09-28 DIAGNOSIS — F329 Major depressive disorder, single episode, unspecified: Secondary | ICD-10-CM | POA: Diagnosis not present

## 2018-09-28 NOTE — Progress Notes (Signed)
Virtual Visit via Video Note  I connected with Melinda Mills on 09/28/18 at  3:30 PM EDT by a video enabled telemedicine application and verified that I am speaking with the correct person using two identifiers.   I discussed the limitations of evaluation and management by telemedicine and the availability of in person appointments. The patient expressed understanding and agreed to proceed.  I discussed the assessment and treatment plan with the patient. The patient was provided an opportunity to ask questions and all were answered. The patient agreed with the plan and demonstrated an understanding of the instructions.   The patient was advised to call back or seek an in-person evaluation if the symptoms worsen or if the condition fails to improve as anticipated.  I provided 50 minutes of non-face-to-face time during this encounter.   [name], LCSW    THERAPIST PROGRESS NOTE  Session Time: 1530  Participation Level: Active  Behavioral Response: NeatAlertAnxious  Type of Therapy: Individual Therapy  Treatment Goals addressed: Anxiety  Interventions: Strength-based  Summary: Melinda Mills is a 28 y.o. female who presents with continued symptoms of her diagnosis. Melinda Mills reports doing well since our last session. She states she implemented a few things we discussed at our last session, including, "giving my husband more information and not asking him to read my mind." She was able to use assertive communication effectively to allow her husband to communicate more clearly with her and vice versa. Because of this, she reports her anxiety around when/if he is going to do tasks around the house has decreased and she is able to relax more easily. LCSW validated her progress and attempts to utilize skills learned during our sessions. Further, Laurine reports there are other things she would like to discuss with her husband but is unsure how to do so without sounding harsh, "sometimes when  he's watching the baby, I feel like he's not really watching him even though I know he is." LCSW encouraged Melinda Mills to utilize I statements in order to put the focus on her feelings rather than what she feels her husband may be doing wrong in the situation. Marvie was able to understand this idea and expressed agreement.   Suicidal/Homicidal: No  Therapist Response: Melinda Mills continues to work towards her tx goals but has not yet reached them. We will cotninue to work on assertive communication and CBT skills moving forward.   Plan: Return again in 3 weeks.  Diagnosis: Axis I: MDD    Axis II: No diagnosis    [name], LCSW 09/28/2018

## 2018-10-02 ENCOUNTER — Other Ambulatory Visit: Payer: Self-pay | Admitting: Psychiatry

## 2018-10-02 DIAGNOSIS — F329 Major depressive disorder, single episode, unspecified: Secondary | ICD-10-CM

## 2018-10-05 ENCOUNTER — Other Ambulatory Visit: Payer: Self-pay

## 2018-10-05 ENCOUNTER — Ambulatory Visit (INDEPENDENT_AMBULATORY_CARE_PROVIDER_SITE_OTHER): Payer: BC Managed Care – PPO | Admitting: Psychiatry

## 2018-10-05 ENCOUNTER — Encounter: Payer: Self-pay | Admitting: Psychiatry

## 2018-10-05 DIAGNOSIS — F411 Generalized anxiety disorder: Secondary | ICD-10-CM | POA: Insufficient documentation

## 2018-10-05 DIAGNOSIS — F5105 Insomnia due to other mental disorder: Secondary | ICD-10-CM

## 2018-10-05 DIAGNOSIS — O99345 Other mental disorders complicating the puerperium: Secondary | ICD-10-CM

## 2018-10-05 DIAGNOSIS — F329 Major depressive disorder, single episode, unspecified: Secondary | ICD-10-CM | POA: Diagnosis not present

## 2018-10-05 NOTE — Progress Notes (Signed)
Virtual Visit via Video Note  I connected with Melinda Mills on 10/05/18 at  4:00 PM EDT by a video enabled telemedicine application and verified that I am speaking with the correct person using two identifiers.   I discussed the limitations of evaluation and management by telemedicine and the availability of in person appointments. The patient expressed understanding and agreed to proceed.    I discussed the assessment and treatment plan with the patient. The patient was provided an opportunity to ask questions and all were answered. The patient agreed with the plan and demonstrated an understanding of the instructions.   The patient was advised to call back or seek an in-person evaluation if the symptoms worsen or if the condition fails to improve as anticipated.  BH MD OP Progress Note  10/05/2018 4:10 PM Melinda Mills  MRN:  [id number]  Chief Complaint:  Chief Complaint    Follow-up     HPI: Melinda Mills is a 28 year old Caucasian female, engaged, employed, lives in [place], has a history of depression, anxiety, insomnia, peripartum onset, history of seizure disorder was evaluated by telemedicine today.  Patient today reports she is currently coping okay with the COVID-19 outbreak.  She works remotely from home.  She also has a 5480-month-old baby who has started crawling.  She has has been very busy with work and her child.  She reports she does have support from her family who has been taking care of her baby and that has helped her to focus on work when she needs to.  She reports she is doing okay on the current medication regimen.  She takes Lexapro and BuSpar.  She reports her depression and anxiety symptoms is improving on the current medication.  She denies any side effects to the medication.  She reports sleep is good.  Patient denies any suicidality, homicidality or perceptual disturbances.  Patient has started psychotherapy sessions with Ms. Heidi DachKelsey Craig which is going  well.  She denies any other concerns today.   Visit Diagnosis:    ICD-10-CM   1. MDD (major depressive disorder), single episode with postpartum onset O99.345    F32.9   2. GAD (generalized anxiety disorder) F41.1   3. Insomnia due to mental condition F51.05     Past Psychiatric History: Reviewed past psychiatric history from my progress note on 08/15/2018.  Past trials of Zoloft-noncompliant, Lexapro  Past Medical History:  Past Medical History:  Diagnosis Date  . ADHD (attention deficit hyperactivity disorder)   . Depression   . Seizures (HCC)     Past Surgical History:  Procedure Laterality Date  . APPENDECTOMY  2008/2009    Family Psychiatric History: I have reviewed family psychiatric history from my progress note on 08/15/2018.  Family History:  Family History  Adopted: Yes    Social History: Reviewed social history from my progress note on 08/15/2018. Social History   Socioeconomic History  . Marital status: Significant Other    Spouse name: Not on file  . Number of children: 1  . Years of education: Not on file  . Highest education level: Bachelor's degree (e.g., BA, AB, BS)  Occupational History  . Not on file  Social Needs  . Financial resource strain: Not very hard  . Food insecurity:    Worry: Never true    Inability: Never true  . Transportation needs:    Medical: No    Non-medical: No  Tobacco Use  . Smoking status: Never Smoker  . Smokeless tobacco: Never Used  Substance and Sexual Activity  . Alcohol use: Not Currently    Frequency: Never  . Drug use: Not Currently    Types: Marijuana  . Sexual activity: Yes  Lifestyle  . Physical activity:    Days per week: 0 days    Minutes per session: 0 min  . Stress: Not at all  Relationships  . Social connections:    Talks on phone: More than three times a week    Gets together: More than three times a week    Attends religious service: 1 to 4 times per year    Active member of club or  organization: No    Attends meetings of clubs or organizations: Never    Relationship status: Living with partner  Other Topics Concern  . Not on file  Social History Narrative  . Not on file    Allergies: No Known Allergies  Metabolic Disorder Labs: No results found for: HGBA1C, MPG No results found for: PROLACTIN No results found for: CHOL, TRIG, HDL, CHOLHDL, VLDL, LDLCALC Lab Results  Component Value Date   TSH 3.640 08/15/2018    Therapeutic Level Labs: No results found for: LITHIUM No results found for: VALPROATE No components found for:  CBMZ  Current Medications: Current Outpatient Medications  Medication Sig Dispense Refill  . busPIRone (BUSPAR) 10 MG tablet TAKE 1 TABLET BY MOUTH TWICE A DAY 180 tablet 1  . escitalopram (LEXAPRO) 20 MG tablet Take by mouth.    . Norgestimate-Ethinyl Estradiol Triphasic 0.18/0.215/0.25 MG-35 MCG tablet Take by mouth.     No current facility-administered medications for this visit.      Musculoskeletal: Strength & Muscle Tone: within normal limits Gait & Station: normal Patient leans: N/A  Psychiatric Specialty Exam: Review of Systems  Psychiatric/Behavioral: The patient is nervous/anxious.   All other systems reviewed and are negative.   not currently breastfeeding.There is no height or weight on file to calculate BMI.  General Appearance: Casual  Eye Contact:  Fair  Speech:  Clear and Coherent  Volume:  Normal  Mood:  Anxious  Affect:  Appropriate  Thought Process:  Goal Directed and Descriptions of Associations: Intact  Orientation:  Full (Time, Place, and Person)  Thought Content: Logical   Suicidal Thoughts:  No  Homicidal Thoughts:  No  Memory:  Immediate;   Fair Recent;   Fair Remote;   Fair  Judgement:  Fair  Insight:  Fair  Psychomotor Activity:  Normal  Concentration:  Concentration: Fair and Attention Span: Fair  Recall:  [employer] of Knowledge: Fair  Language: Fair  Akathisia:  No  Handed:   Right  AIMS (if indicated): denies tremors, rigidity  Assets:  Communication Skills Desire for Improvement Housing Social Support Talents/Skills  ADL's:  Intact  Cognition: WNL  Sleep:  Fair   Screenings:   Assessment and Plan: [name] is a 28 year old Caucasian female, engaged, employed, lives in [place], has a history of depression, anxiety, insomnia-peripartum onset, history of seizure disorder was evaluated today.  Patient with psychosocial stressors of current COVID-19 outbreak.  Patient is currently making progress on the current medication regimen.  Plan as noted below.  Plan MDD-improving Lexapro 20 mg p.o. daily BuSpar 10 mg p.o. twice daily Continue psychotherapy sessions with Ms. Tasia Catchings.   For GAD-improving Lexapro and BuSpar as prescribed  For insomnia-improving Patient to continue to make use of sleep hygiene techniques.  Follow-up in clinic in 1 month or sooner if needed.  Appointment scheduled for July 2  at 10:30 AM  I have spent atleast 15 minutes non- face to face with patient today. More than 50 % of the time was spent for psychoeducation and supportive psychotherapy and care coordination.  This note was generated in part or whole with voice recognition software. Voice recognition is usually quite accurate but there are transcription errors that can and very often do occur. I apologize for any typographical errors that were not detected and corrected.       Jomarie Longs, MD 10/05/2018, 4:10 PM

## 2018-10-18 ENCOUNTER — Other Ambulatory Visit: Payer: Self-pay

## 2018-10-18 ENCOUNTER — Encounter: Payer: Self-pay | Admitting: Licensed Clinical Social Worker

## 2018-10-18 ENCOUNTER — Ambulatory Visit (INDEPENDENT_AMBULATORY_CARE_PROVIDER_SITE_OTHER): Payer: BC Managed Care – PPO | Admitting: Licensed Clinical Social Worker

## 2018-10-18 DIAGNOSIS — O99345 Other mental disorders complicating the puerperium: Secondary | ICD-10-CM | POA: Diagnosis not present

## 2018-10-18 DIAGNOSIS — F411 Generalized anxiety disorder: Secondary | ICD-10-CM | POA: Diagnosis not present

## 2018-10-18 DIAGNOSIS — F329 Major depressive disorder, single episode, unspecified: Secondary | ICD-10-CM

## 2018-10-18 NOTE — Progress Notes (Signed)
Virtual Visit via Telephone Note  I connected with Melinda Mills on 10/18/18 at  2:30 PM EDT by telephone and verified that I am speaking with the correct person using two identifiers.   I discussed the limitations, risks, security and privacy concerns of performing an evaluation and management service by telephone and the availability of in person appointments. I also discussed with the patient that there may be a patient responsible charge related to this service. The patient expressed understanding and agreed to proceed.  I discussed the assessment and treatment plan with the patient. The patient was provided an opportunity to ask questions and all were answered. The patient agreed with the plan and demonstrated an understanding of the instructions.   The patient was advised to call back or seek an in-person evaluation if the symptoms worsen or if the condition fails to improve as anticipated.  I provided 50  minutes of non-face-to-face time during this encounter.   [name], LCSW    THERAPIST PROGRESS NOTE  Session Time: 1430  Participation Level: Active  Behavioral Response: NAAlertAnxious  Type of Therapy: Individual Therapy  Treatment Goals addressed: Anxiety  Interventions: Supportive  Summary: Melinda Mills is a 28 y.o. female who presents with continued symptoms related to her diagnosis. Melinda Mills reports doing well since our last session. She reports feeling her anxiety has decreased and she is able to think through her responses to things more easily. Melinda Mills reports her improved communication with her fiance has helped their relationship, and she does not feel as resentful as she did a month ago. She reports they are still having growing pains in terms of communication, but are working through it in the moment. Melinda Mills also reported feeling she is able to "hold her tongue," better than she was previously, "I'm trying to be less impulsive so I'm trying to think through the  situation and how Harrison Mons might react before I actually say something." LCSW highlighted that as significant progress, and encouraged Melinda Mills to do the same. Melinda Mills was able to do so and recognized there have been many times she would have made a comment without thinking of how her fiance would respond. LCSW encouraged Melinda Mills to give herself credit for that and encouraged her to continue that habit. Melinda Mills expressed understanding and agreement with this idea. Melinda Mills reported feeling anxious about various tasks in the household that her fiance needs to speak with his mother about. "I Just want to know if he's asked her yet or where he is with everything. I should just ask him." LCSW encouraged Melinda Mills to ask her fiance and to think through how she planned to word everything to avoid placing unnecessary blame or frustration on [place]. Song expressed understanding and agreement.   Suicidal/Homicidal: No  Therapist Response: Melinda Mills continues to work towards her tx goals but has not yet reached them. We will continue to work on emotional regulation skills and improving communication moving forward.   Plan: Return again in 2 weeks.  Diagnosis: Axis I: Generalized Anxiety Disorder    Axis II: No diagnosis    [name], LCSW 10/18/2018

## 2018-11-08 ENCOUNTER — Ambulatory Visit (INDEPENDENT_AMBULATORY_CARE_PROVIDER_SITE_OTHER): Payer: BC Managed Care – PPO | Admitting: Licensed Clinical Social Worker

## 2018-11-08 ENCOUNTER — Other Ambulatory Visit: Payer: Self-pay

## 2018-11-08 ENCOUNTER — Encounter: Payer: Self-pay | Admitting: Licensed Clinical Social Worker

## 2018-11-08 DIAGNOSIS — F329 Major depressive disorder, single episode, unspecified: Secondary | ICD-10-CM

## 2018-11-08 DIAGNOSIS — F411 Generalized anxiety disorder: Secondary | ICD-10-CM | POA: Diagnosis not present

## 2018-11-08 DIAGNOSIS — O99345 Other mental disorders complicating the puerperium: Secondary | ICD-10-CM
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
## 2018-11-23 ENCOUNTER — Other Ambulatory Visit: Payer: Self-pay

## 2018-11-23 ENCOUNTER — Ambulatory Visit (INDEPENDENT_AMBULATORY_CARE_PROVIDER_SITE_OTHER): Payer: BC Managed Care – PPO | Admitting: Psychiatry

## 2018-11-23 ENCOUNTER — Encounter: Payer: Self-pay | Admitting: Psychiatry

## 2018-11-23 DIAGNOSIS — F329 Major depressive disorder, single episode, unspecified: Secondary | ICD-10-CM

## 2018-11-23 DIAGNOSIS — O99345 Other mental disorders complicating the puerperium: Secondary | ICD-10-CM | POA: Diagnosis not present

## 2018-11-23 DIAGNOSIS — F411 Generalized anxiety disorder: Secondary | ICD-10-CM

## 2018-11-23 DIAGNOSIS — F5105 Insomnia due to other mental disorder: Secondary | ICD-10-CM | POA: Diagnosis not present

## 2018-11-23 MED ORDER — ESCITALOPRAM OXALATE 20 MG PO TABS: 20.0000 mg | ORAL_TABLET | Freq: Every day | ORAL | 1 refills | Status: DC

## 2018-11-23 MED ORDER — BUSPIRONE HCL 10 MG PO TABS: 10.0000 mg | ORAL_TABLET | Freq: Three times a day (TID) | ORAL | 0 refills | Status: DC

## 2018-11-23 NOTE — Progress Notes (Signed)
Virtual Visit via Video Note  I connected with Melinda Mills on 11/23/18 at 10:30 AM EDT by a video enabled telemedicine application and verified that I am speaking with the correct person using two identifiers.   I discussed the limitations of evaluation and management by telemedicine and the availability of in person appointments. The patient expressed understanding and agreed to proceed.   I discussed the assessment and treatment plan with the patient. The patient was provided an opportunity to ask questions and all were answered. The patient agreed with the plan and demonstrated an understanding of the instructions.   The patient was advised to call back or seek an in-person evaluation if the symptoms worsen or if the condition fails to improve as anticipated.   BH MD OP Progress Note  11/23/2018 6:00 PM Melinda Mills  MRN:  [id number]  Chief Complaint:  Chief Complaint    Follow-up     HPI: Melinda Mills is a 28 year old Caucasian female, engaged, employed, lives in [place], has a history of MDD, GAD, insomnia, history of seizure disorder was evaluated by telemedicine today.  Video call was initiated however had to be changed to a phone call during the session.  Patient today reports she continues to struggle with some anxiety symptoms, irritability.  She reports her oral contraceptive pill was recently changed.  She reports ever since then she has noticed some mood lability and irritability and hence thinks it is due to that new medication.  She hence wants to give it some more time.  She reports she continues to struggle with some relationship struggles with her boyfriend's mother.  She reports that makes her on edge and irritable often.  Discussed with patient that her medications can be readjusted to help with her mood symptoms.  We will increase her BuSpar today.  She will continue the Lexapro at 20 mg.  Discussed with her to continue to work with her therapist and talk about her  relationship struggles.  Patient reports sleep is restless at times due to the baby.  She however reports she has no difficulty falling back asleep.  She reports she does not want to be on any sleep medications at this time.  Patient denies any suicidality, homicidality or perceptual disturbances.   Visit Diagnosis:    ICD-10-CM   1. MDD (major depressive disorder), single episode with postpartum onset  O99.345 escitalopram (LEXAPRO) 20 MG tablet   F32.9 busPIRone (BUSPAR) 10 MG tablet  2. 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  Past trials of Zoloft-noncompliant, Lexapro.  Past Medical History:  Past Medical History:  Diagnosis Date  . ADHD (attention deficit hyperactivity disorder)   . Depression   . Seizures (HCC)     Past Surgical History:  Procedure Laterality Date  . APPENDECTOMY  2008/2009    Family Psychiatric History: I have reviewed family psychiatric history from my progress note on 08/15/2018.  Family History:  Family History  Adopted: Yes    Social History: I have reviewed social history from my progress note on 08/15/2018 Social History   Socioeconomic History  . Marital status: Significant Other    Spouse name: Not on file  . Number of children: 1  . Years of education: Not on file  . Highest education level:  Bachelor's degree (e.g., BA, AB, BS)  Occupational History  . Not on file  Social Needs  . Financial resource strain: Not very hard  . Food insecurity    Worry: Never true    Inability: Never true  . Transportation needs    Medical: No    Non-medical: No  Tobacco Use  . Smoking status: Never Smoker  . Smokeless tobacco: Never Used  Substance and Sexual Activity  . Alcohol use: Not Currently    Frequency: Never  .  Drug use: Not Currently    Types: Marijuana  . Sexual activity: Yes  Lifestyle  . Physical activity    Days per week: 0 days    Minutes per session: 0 min  . Stress: Not at all  Relationships  . Social connections    Talks on phone: More than three times a week    Gets together: More than three times a week    Attends religious service: 1 to 4 times per year    Active member of club or organization: No    Attends meetings of clubs or organizations: Never    Relationship status: Living with partner  Other Topics Concern  . Not on file  Social History Narrative  . Not on file    Allergies: No Known Allergies  Metabolic Disorder Labs: No results found for: HGBA1C, MPG No results found for: PROLACTIN No results found for: CHOL, TRIG, HDL, CHOLHDL, VLDL, LDLCALC Lab Results  Component Value Date   TSH 3.640 08/15/2018    Therapeutic Level Labs: No results found for: LITHIUM No results found for: VALPROATE No components found for:  CBMZ  Current Medications: Current Outpatient Medications  Medication Sig Dispense Refill  . norgestimate-ethinyl estradiol (ORTHO-CYCLEN) 0.25-35 MG-MCG tablet Take by mouth.    . busPIRone (BUSPAR) 10 MG tablet Take 1 tablet (10 mg total) by mouth 3 (three) times daily. 90 tablet 0  . escitalopram (LEXAPRO) 20 MG tablet Take 1 tablet (20 mg total) by mouth daily. 90 tablet 1  . Norgestimate-Ethinyl Estradiol Triphasic 0.18/0.215/0.25 MG-35 MCG tablet Take by mouth.    . SPRINTEC 28 0.25-35 MG-MCG tablet      No current facility-administered medications for this visit.      Musculoskeletal: Strength & Muscle Tone: UTA Gait & Station: Reports as wnl Patient leans: N/A  Psychiatric Specialty Exam: Review of Systems  Psychiatric/Behavioral: The patient is nervous/anxious.   All other systems reviewed and are negative.   not currently breastfeeding.There is no height or weight on file to calculate BMI.  General Appearance: UTA  Eye  Contact:  UTA  Speech:  Clear and Coherent  Volume:  Normal  Mood:  Anxious and Irritable  Affect:  UTA  Thought Process:  Goal Directed and Descriptions of Associations: Intact  Orientation:  Full (Time, Place, and Person)  Thought Content: Logical   Suicidal Thoughts:  No  Homicidal Thoughts:  No  Memory:  Immediate;   Fair Recent;   Fair Remote;   Fair  Judgement:  Fair  Insight:  Fair  Psychomotor Activity:  UTA  Concentration:  Concentration: Fair and Attention Span: Fair  Recall:  [employer] of Knowledge: Fair  Language: Fair  Akathisia:  No  Handed:  Right  AIMS (if indicated): Denies tremors, rigidity  Assets:  Communication Skills Desire for Improvement Social Support  ADL's:  Intact  Cognition: WNL  Sleep:  restless due to baby   Screenings:   Assessment and Plan: Melinda Mills is  a 28 year old Caucasian female, engaged, employed, lives in [place], has a history of depression, anxiety, insomnia-peripartum onset, history of seizure disorder was evaluated by telemedicine today.  Patient with psychosocial stressors of having a young baby at home, COVID-19 outbreak as well as relationship struggles.  Patient will continue to benefit from medication readjustment for mood symptoms.  Plan as noted below.  Plan MDD- improving Lexapro 20 mg p.o. daily Continue psychotherapy sessions with Ms. Tasia Catchingsraig.  For GAD- some progress Lexapro as scheduled Increase BuSpar to 10 mg p.o. 3 times daily.  For insomnia- restless Discussed sleep hygiene techniques. She declines medications now.  Follow-up in clinic in 1 month or sooner if needed.  August 19 at 8:30 AM  I have spent atleast 15 minutes non  face to face with patient today. More than 50 % of the time was spent for psychoeducation and supportive psychotherapy and care coordination.  This note was generated in part or whole with voice recognition software. Voice recognition is usually quite accurate but there are  transcription errors that can and very often do occur. I apologize for any typographical errors that were not detected and corrected.     Jomarie LongsSaramma Daivion Pape, MD 11/23/2018, 6:00 PM

## 2018-12-04 ENCOUNTER — Encounter: Payer: Self-pay | Admitting: Licensed Clinical Social Worker

## 2018-12-04 ENCOUNTER — Ambulatory Visit (INDEPENDENT_AMBULATORY_CARE_PROVIDER_SITE_OTHER): Payer: BC Managed Care – PPO | Admitting: Licensed Clinical Social Worker

## 2018-12-04 ENCOUNTER — Other Ambulatory Visit: Payer: Self-pay

## 2018-12-04 DIAGNOSIS — O99345 Other mental disorders complicating the puerperium: Secondary | ICD-10-CM | POA: Diagnosis not present

## 2018-12-04 DIAGNOSIS — F411 Generalized anxiety disorder: Secondary | ICD-10-CM | POA: Diagnosis not present

## 2018-12-04 DIAGNOSIS — F329 Major depressive disorder, single episode, unspecified: Secondary | ICD-10-CM | POA: Diagnosis not present

## 2018-12-04 NOTE — Progress Notes (Signed)
Virtual Visit via Video Note  I connected with Melinda Mills on 12/04/18 at 12:30 PM EDT by a video enabled telemedicine application and verified that I am speaking with the correct person using two identifiers.   I discussed the limitations of evaluation and management by telemedicine and the availability of in person appointments. The patient expressed understanding and agreed to proceed.   I discussed the assessment and treatment plan with the patient. The patient was provided an opportunity to ask questions and all were answered. The patient agreed with the plan and demonstrated an understanding of the instructions.   The patient was advised to call back or seek an in-person evaluation if the symptoms worsen or if the condition fails to improve as anticipated.  I provided 60 minutes of non-face-to-face time during this encounter.   [name], LCSW   THERAPIST PROGRESS NOTE  Session Time: 1230  Participation Level: Active  Behavioral Response: NeatAlertAnxious  Type of Therapy: Individual Therapy  Treatment Goals addressed: Anxiety  Interventions: Supportive  Summary: Melinda Mills is a 28 y.o. female who presents with continued symptoms related to her diagnosis. Melinda Mills reports doing well since our last session. She reports she and her boyfriend's family went to the beach last week, and had a few situations with her mother in law that increased Melinda Mills's anxiety. Melinda Mills reported needing to intervene a couple times when her mother-in-law was going to pick up pt's son, as they are trying to get him more used to not being carried at all times. She reported several other incidents where she had to have a conversation with her mother-in-law. LCSW validated Melinda Mills's feelings around this subject, and highlighted her use of assertive communication in those situations, and her ability to think through what she wanted to say. Melinda Mills was able to see this as progress and expressed agreement.  We discussed ways, in the future, she could communicate more effectively with her mother in law and her fiance's family. We discussed the importance of setting boundaries and how that can be done effectively and appropriately.   Suicidal/Homicidal: No  Therapist Response: Melinda Mills continues to work towards her tx goals but has not yet reached them. We will continue to work on improving communication and utilizing CBT to manage anxiety in the moment.   Plan: Return again in 4 weeks.  Diagnosis: Axis I: Generalized Anxiety Disorder    Axis II: No diagnosis    [name], LCSW 12/04/2018

## 2019-01-04 ENCOUNTER — Other Ambulatory Visit: Payer: Self-pay

## 2019-01-04 ENCOUNTER — Ambulatory Visit: Payer: BC Managed Care – PPO | Admitting: Licensed Clinical Social Worker

## 2019-01-10 ENCOUNTER — Other Ambulatory Visit: Payer: Self-pay

## 2019-01-10 ENCOUNTER — Encounter: Payer: Self-pay | Admitting: Psychiatry

## 2019-01-10 ENCOUNTER — Ambulatory Visit (INDEPENDENT_AMBULATORY_CARE_PROVIDER_SITE_OTHER): Payer: BC Managed Care – PPO | Admitting: Psychiatry

## 2019-01-10 DIAGNOSIS — F411 Generalized anxiety disorder: Secondary | ICD-10-CM | POA: Diagnosis not present

## 2019-01-10 DIAGNOSIS — F5105 Insomnia due to other mental disorder: Secondary | ICD-10-CM

## 2019-01-10 DIAGNOSIS — F909 Attention-deficit hyperactivity disorder, unspecified type: Secondary | ICD-10-CM | POA: Diagnosis not present

## 2019-01-10 DIAGNOSIS — O99345 Other mental disorders complicating the puerperium: Secondary | ICD-10-CM

## 2019-01-10 DIAGNOSIS — F902 Attention-deficit hyperactivity disorder, combined type: Secondary | ICD-10-CM | POA: Insufficient documentation

## 2019-01-10 DIAGNOSIS — F329 Major depressive disorder, single episode, unspecified: Secondary | ICD-10-CM

## 2019-01-10 MED ORDER — HYDROXYZINE HCL 25 MG PO TABS: 12.5000 mg | ORAL_TABLET | Freq: Two times a day (BID) | ORAL | 1 refills | Status: DC | PRN

## 2019-01-10 NOTE — Progress Notes (Signed)
Virtual Visit via Video Note  I connected with Melinda Mills on 01/10/19 at  8:30 AM EDT by a video enabled telemedicine application and verified that I am speaking with the correct person using two identifiers.   I discussed the limitations of evaluation and management by telemedicine and the availability of in person appointments. The patient expressed understanding and agreed to proceed.   I discussed the assessment and treatment plan with the patient. The patient was provided an opportunity to ask questions and all were answered. The patient agreed with the plan and demonstrated an understanding of the instructions.   The patient was advised to call back or seek an in-person evaluation if the symptoms worsen or if the condition fails to improve as anticipated.   BH MD OP Progress Note  01/10/2019 10:14 AM Melinda Mills  MRN:  [id number]  Chief Complaint:  Chief Complaint    Follow-up     HPI: Melinda Mills is a 28 year old Caucasian female, engaged, employed, lives in [place], has a history of MDD, GAD, insomnia, history of seizure disorder was evaluated by telemedicine today.  Patient reports she continues to struggle with some anxiety symptoms on and off.  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  She reports currently there are a lot of unknowns and that is making her more anxious.  She also reports her wedding is coming up and there is a lot of planning to do which is also kind of making her anxious.  She reports Lexapro as helpful with her mood.  She reports she does not want to take the BuSpar since it kinds of make her feel weird.  She had stopped taking it.  She reports she went to [facility] recently and got tested for ADHD.  She was started on Adderall.  She reports even with the Adderall she continues to struggle with concentration and focus .  She has upcoming appointment with them for follow-up.  She reports sleep is good  however she has an 5727-month-old baby who may need her help.  Patient denies any suicidality, homicidality or perceptual disturbances.  Patient denies any other concerns today. Visit Diagnosis:    ICD-10-CM   1. MDD (major depressive disorder), single episode with postpartum onset  O99.345    F32.9   2. GAD (generalized anxiety disorder)  F41.1 hydrOXYzine (ATARAX/VISTARIL) 25 MG tablet  3. Insomnia due to mental condition  F51.05   4. Attention deficit hyperactivity disorder (ADHD), unspecified ADHD type  F90.9     Past Psychiatric History: I have reviewed past psychiatric history from my progress note on 08/15/2018.  Past trials of Zoloft-noncompliant, Lexapro, BuSpar-noncompliant  Past Medical History:  Past Medical History:  Diagnosis Date  . ADHD (attention deficit hyperactivity disorder)   . Depression   . Seizures (HCC)     Past Surgical History:  Procedure Laterality Date  . APPENDECTOMY  2008/2009    Family Psychiatric History: I have reviewed family psychiatric history from my progress note on 08/15/2018.  Family History:  Family History  Adopted: Yes    Social History: I have reviewed social history from my progress note on 08/15/2018. Social History   Socioeconomic History  . Marital status: Significant Other    Spouse name: Not on file  . Number of children: 1  . Years of education: Not on file  . Highest education level: Bachelor's degree (e.g., BA, AB, BS)  Occupational History  . Not on file  Social Needs  . Financial resource strain: Not very hard  . Food insecurity    Worry: Never true    Inability: Never true  . Transportation needs    Medical: No    Non-medical: No  Tobacco Use  . Smoking status: Never Smoker  . Smokeless tobacco: Never Used  Substance and Sexual Activity  . Alcohol use: Not Currently    Frequency: Never  . Drug use: Not Currently    Types: Marijuana  . Sexual activity: Yes  Lifestyle  . Physical activity    Days per  week: 0 days    Minutes per session: 0 min  . Stress: Not at all  Relationships  . Social connections    Talks on phone: More than three times a week    Gets together: More than three times a week    Attends religious service: 1 to 4 times per year    Active member of club or organization: No    Attends meetings of clubs or organizations: Never    Relationship status: Living with partner  Other Topics Concern  . Not on file  Social History Narrative  . Not on file    Allergies: No Known Allergies  Metabolic Disorder Labs: No results found for: HGBA1C, MPG No results found for: PROLACTIN No results found for: CHOL, TRIG, HDL, CHOLHDL, VLDL, LDLCALC Lab Results  Component Value Date   TSH 3.640 08/15/2018    Therapeutic Level Labs: No results found for: LITHIUM No results found for: VALPROATE No components found for:  CBMZ  Current Medications: Current Outpatient Medications  Medication Sig Dispense Refill  . amphetamine-dextroamphetamine (ADDERALL XR) 15 MG 24 hr capsule     . escitalopram (LEXAPRO) 20 MG tablet Take 1 tablet (20 mg total) by mouth daily. 90 tablet 1  . hydrOXYzine (ATARAX/VISTARIL) 25 MG tablet Take 0.5-1 tablets (12.5-25 mg total) by mouth 2 (two) times daily as needed. 60 tablet 1  . norgestimate-ethinyl estradiol (ORTHO-CYCLEN) 0.25-35 MG-MCG tablet Take by mouth.    . Norgestimate-Ethinyl Estradiol Triphasic 0.18/0.215/0.25 MG-35 MCG tablet Take by mouth.    . SPRINTEC 28 0.25-35 MG-MCG tablet      No current facility-administered medications for this visit.      Musculoskeletal: Strength & Muscle Tone: UTA Gait & Station: normal Patient leans: N/A  Psychiatric Specialty Exam: Review of Systems  Psychiatric/Behavioral: The patient is nervous/anxious.   All other systems reviewed and are negative.   not currently breastfeeding.There is no height or weight on file to calculate BMI.  General Appearance: Casual  Eye Contact:  Good  Speech:   Clear and Coherent  Volume:  Normal  Mood:  Anxious  Affect:  Congruent  Thought Process:  Goal Directed and Descriptions of Associations: Intact  Orientation:  Full (Time, Place, and Person)  Thought Content: Logical   Suicidal Thoughts:  No  Homicidal Thoughts:  No  Memory:  Immediate;   Fair Recent;   Fair Remote;   Fair  Judgement:  Fair  Insight:  Fair  Psychomotor Activity:  Normal  Concentration:  Concentration: Fair and Attention Span: Fair  Recall:  [employer]Fair  Fund of Knowledge: Fair  Language: Fair  Akathisia:  No  Handed:  Right  AIMS (if indicated):Denies tremors, rigidity  Assets:  Communication Skills Desire for Improvement Social Support  ADL's:  Intact  Cognition: WNL  Sleep:  Fair   Screenings:   Assessment and Plan: Melinda Mills is a 28 year old Caucasian female, engaged, employed, lives in  [place], has a history of depression, anxiety, insomnia-peripartum onset, history of seizure disorder was evaluated by telemedicine today.  Patient with psychosocial stressors of having a young baby at home, COVID-19 outbreak as well as relationship struggles.  Patient will continue to benefit from medication readjustment for her anxiety symptoms.  Plan as noted below.  Plan For MDD- improving Lexapro 20 mg p.o. daily Continue psychotherapy sessions with Ms. Cecilie Lowers  For GAD- improving Lexapro schedule Discontinue BuSpar for noncompliance as well as side effects. Start hydroxyzine 12.5 to 25 mg twice a day as needed for anxiety symptoms Discussed with patient to limit hydroxyzine and to work with therapist.  For insomnia-improving She will continue to work on sleep hygiene techniques.  For ADHD- she will continue to work with [facility].  Follow-up in clinic in 1 to 2 months or sooner if needed.  October 6 at 8:30 AM  I have spent atleast 15 minutes non face to face with patient today. More than 50 % of the time was spent for psychoeducation and  supportive psychotherapy and care coordination.  This note was generated in part or whole with voice recognition software. Voice recognition is usually quite accurate but there are transcription errors that can and very often do occur. I apologize for any typographical errors that were not detected and corrected.        Ursula Alert, MD 01/10/2019, 10:14 AM

## 2019-02-01 ENCOUNTER — Ambulatory Visit (INDEPENDENT_AMBULATORY_CARE_PROVIDER_SITE_OTHER): Payer: BC Managed Care – PPO | Admitting: Licensed Clinical Social Worker

## 2019-02-01 ENCOUNTER — Other Ambulatory Visit: Payer: Self-pay

## 2019-02-01 ENCOUNTER — Encounter: Payer: Self-pay | Admitting: Licensed Clinical Social Worker

## 2019-02-01 DIAGNOSIS — F411 Generalized anxiety disorder: Secondary | ICD-10-CM | POA: Diagnosis not present

## 2019-02-01 DIAGNOSIS — F329 Major depressive disorder, single episode, unspecified: Secondary | ICD-10-CM

## 2019-02-01 DIAGNOSIS — O99345 Other mental disorders complicating the puerperium: Secondary | ICD-10-CM | POA: Diagnosis not present

## 2019-02-01 NOTE — Progress Notes (Signed)
Virtual Visit via Video Note  I connected with Georgeanne Nim on 02/01/19 at  1:30 PM EDT by a video enabled telemedicine application and verified that I am speaking with the correct person using two identifiers.   I discussed the limitations of evaluation and management by telemedicine and the availability of in person appointments. The patient expressed understanding and agreed to proceed.  I discussed the assessment and treatment plan with the patient. The patient was provided an opportunity to ask questions and all were answered. The patient agreed with the plan and demonstrated an understanding of the instructions.   The patient was advised to call back or seek an in-person evaluation if the symptoms worsen or if the condition fails to improve as anticipated.  I provided 60 minutes of non-face-to-face time during this encounter.   [name], LCSW    THERAPIST PROGRESS NOTE  Session Time: 1330  Participation Level: Active  Behavioral Response: CasualAlertAnxious  Type of Therapy: Individual Therapy  Treatment Goals addressed: Anxiety  Interventions: Supportive  Summary: Charday Capetillo is a 28 y.o. female who presents with continued symptoms related to her diagnosis. Taisley reports doing well since our last session, but noted a few moments of increased anxiety. She reports she was tested for ADHD and was prescribed Adderall. She noted that after she started taking her Adderall, she noticed increased anxiety and her fiance noted "mood swings." We discussed how anxiety can increase due to ADHD medication, and the Lexapro should help manage that. She reported she stopped taking the Lexapro because she felt it was the two medications together causing the problems. We discussed how Adderall can contribute to other mood changes, so to monitor that closely. LCSW also encouraged Rossetta to discuss medication changes with her MD. She expressed understanding and agreement. She went on to  discuss several incidents with her fiance where she feels they did not communicate well, and she feels she should have handled it differently. We discussed ways to improve distress tolerance, and how she can be less impulsive with her communication. We walked through several examples of how to utilize CBT to manage anxiety and anger in the moment in order to decrease conflict in her relationship.   Suicidal/Homicidal: No  Therapist Response: Kabrea continues to work towards her tx golas but has not yet reached them. We will continue to work on improving communication and emotional regulation skills moving forward.   Plan: Return again in 4 weeks.  Diagnosis: Axis I: Generalized Anxiety Disorder    Axis II: No diagnosis    [name], LCSW 02/01/2019

## 2019-02-14 ENCOUNTER — Other Ambulatory Visit: Payer: Self-pay | Admitting: Psychiatry

## 2019-02-14 DIAGNOSIS — F411 Generalized anxiety disorder: Secondary | ICD-10-CM

## 2019-02-27 ENCOUNTER — Other Ambulatory Visit: Payer: Self-pay

## 2019-02-27 ENCOUNTER — Ambulatory Visit (INDEPENDENT_AMBULATORY_CARE_PROVIDER_SITE_OTHER): Payer: BC Managed Care – PPO | Admitting: Psychiatry

## 2019-02-27 ENCOUNTER — Encounter: Payer: Self-pay | Admitting: Psychiatry

## 2019-02-27 DIAGNOSIS — F909 Attention-deficit hyperactivity disorder, unspecified type: Secondary | ICD-10-CM

## 2019-02-27 DIAGNOSIS — F5105 Insomnia due to other mental disorder: Secondary | ICD-10-CM | POA: Diagnosis not present

## 2019-02-27 DIAGNOSIS — F411 Generalized anxiety disorder: Secondary | ICD-10-CM

## 2019-02-27 DIAGNOSIS — F329 Major depressive disorder, single episode, unspecified: Secondary | ICD-10-CM

## 2019-02-27 DIAGNOSIS — O99345 Other mental disorders complicating the puerperium: Secondary | ICD-10-CM | POA: Diagnosis not present

## 2019-02-27 DIAGNOSIS — G2581 Restless legs syndrome: Secondary | ICD-10-CM | POA: Diagnosis not present

## 2019-02-27 MED ORDER — ROPINIROLE HCL 0.25 MG PO TABS: 0.2500 mg | ORAL_TABLET | Freq: Every day | ORAL | 1 refills | Status: DC

## 2019-02-27 NOTE — Progress Notes (Signed)
Virtual Visit via Video Note  I connected with Melinda Mills on 02/27/19 at  8:30 AM EDT by a video enabled telemedicine application and verified that I am speaking with the correct person using two identifiers.   I discussed the limitations of evaluation and management by telemedicine and the availability of in person appointments. The patient expressed understanding and agreed to proceed.   I discussed the assessment and treatment plan with the patient. The patient was provided an opportunity to ask questions and all were answered. The patient agreed with the plan and demonstrated an understanding of the instructions.   The patient was advised to call back or seek an in-person evaluation if the symptoms worsen or if the condition fails to improve as anticipated.  BH MD OP Progress Note  02/27/2019 11:35 AM Melinda Mills  MRN:  [id number]  Chief Complaint:  Chief Complaint    Follow-up     HPI: Melinda Mills is a 28 year old Caucasian female, engaged, employed, lives in [place], has a history of MDD, GAD, insomnia, history of seizure disorder was evaluated by telemedicine today.  Patient today reports she is currently doing well with regards to her mood.  She denies any significant anxiety or depressive symptoms.  Patient reports she however continues to struggle with sleep.  She reports she is able to sleep 4 to 5 hours at night.  She has no difficulty falling asleep.  She however wakes up at around 3 AM.  She goes to bed usually around 11 PM.  She reports there are times when she cannot fall back asleep but other times she is awake.  She reports she does have restless legs which may also be one of the reason she wakes up around that time.  She has struggled with restlessness of her legs all her life.  She does not think the Adderall could be contributing to it since she had it even when she was off of it during her pregnancy.  Patient reports she was recently started on Mydayis by her  provider at [facility].  She reports she felt her Adderall effect was wearing off when she got back home from work and that is why it was switched to Mydayis.  She however reports she has not felt much benefit from it yet.  She only took it for a couple of days.  She continues to have Adderall available just in case she needs it.  Patient denies any suicidality, homicidality or perceptual disturbances.  Patient is currently looking forward to her wedding which is going to be this weekend.  She reports work is still remote and gets boring and lonely at times.  She however has been coping okay.     Visit Diagnosis:    ICD-10-CM   1. MDD (major depressive disorder), single episode with postpartum onset  O99.345    F32.9    stable  2. GAD (generalized anxiety disorder)  F41.1    stable  3. Insomnia due to mental condition  F51.05   4. RLS (restless legs syndrome)  G25.81 rOPINIRole (REQUIP) 0.25 MG tablet  5. Attention deficit hyperactivity disorder (ADHD), unspecified ADHD type  F90.9     Past Psychiatric History: I have reviewed past psychiatric history from my progress note on 08/15/2018.  Past trials of Zoloft, Lexapro, BuSpar  Past Medical History:  Past Medical History:  Diagnosis Date  . ADHD (attention deficit hyperactivity disorder)   . Depression   . Seizures (HCC)     Past  Surgical History:  Procedure Laterality Date  . APPENDECTOMY  2008/2009    Family Psychiatric History: I have reviewed family psychiatric history from my progress note on 08/15/2018  Family History:  Family History  Adopted: Yes    Social History: I have reviewed social history from my progress note on 08/15/2018 Social History   Socioeconomic History  . Marital status: Significant Other    Spouse name: Not on file  . Number of children: 1  . Years of education: Not on file  . Highest education level: Bachelor's degree (e.g., BA, AB, BS)  Occupational History  . Not on  file  Social Needs  . Financial resource strain: Not very hard  . Food insecurity    Worry: Never true    Inability: Never true  . Transportation needs    Medical: No    Non-medical: No  Tobacco Use  . Smoking status: Never Smoker  . Smokeless tobacco: Never Used  Substance and Sexual Activity  . Alcohol use: Not Currently    Frequency: Never  . Drug use: Not Currently    Types: Marijuana  . Sexual activity: Yes  Lifestyle  . Physical activity    Days per week: 0 days    Minutes per session: 0 min  . Stress: Not at all  Relationships  . Social connections    Talks on phone: More than three times a week    Gets together: More than three times a week    Attends religious service: 1 to 4 times per year    Active member of club or organization: No    Attends meetings of clubs or organizations: Never    Relationship status: Living with partner  Other Topics Concern  . Not on file  Social History Narrative  . Not on file    Allergies: No Known Allergies  Metabolic Disorder Labs: No results found for: HGBA1C, MPG No results found for: PROLACTIN No results found for: CHOL, TRIG, HDL, CHOLHDL, VLDL, LDLCALC Lab Results  Component Value Date   TSH 3.640 08/15/2018    Therapeutic Level Labs: No results found for: LITHIUM No results found for: VALPROATE No components found for:  CBMZ  Current Medications: Current Outpatient Medications  Medication Sig Dispense Refill  . amphetamine-dextroamphetamine (ADDERALL XR) 15 MG 24 hr capsule     . escitalopram (LEXAPRO) 20 MG tablet Take 1 tablet (20 mg total) by mouth daily. 90 tablet 1  . hydrOXYzine (ATARAX/VISTARIL) 25 MG tablet TAKE 1/2-1 TABLETS (12.5-25 MG TOTAL) BY MOUTH 2 (TWO) TIMES DAILY AS NEEDED. 180 tablet 1  . MYDAYIS 12.5 MG CP24     . norethindrone (MICRONOR) 0.35 MG tablet     . norgestimate-ethinyl estradiol (ORTHO-CYCLEN) 0.25-35 MG-MCG tablet Take by mouth.    . Norgestimate-Ethinyl Estradiol Triphasic  0.18/0.215/0.25 MG-35 MCG tablet Take by mouth.    Marland Kitchen rOPINIRole (REQUIP) 0.25 MG tablet Take 1 tablet (0.25 mg total) by mouth at bedtime. 30 tablet 1  . SPRINTEC 28 0.25-35 MG-MCG tablet      No current facility-administered medications for this visit.      Musculoskeletal: Strength & Muscle Tone: UTA Gait & Station: normal Patient leans: N/A  Psychiatric Specialty Exam: Review of Systems  Psychiatric/Behavioral: The patient has insomnia.   All other systems reviewed and are negative.   not currently breastfeeding.There is no height or weight on file to calculate BMI.  General Appearance: Casual  Eye Contact:  Fair  Speech:  Normal Rate  Volume:  Normal  Mood:  Euthymic  Affect:  Congruent  Thought Process:  Goal Directed and Descriptions of Associations: Intact  Orientation:  Full (Time, Place, and Person)  Thought Content: Logical   Suicidal Thoughts:  No  Homicidal Thoughts:  No  Memory:  Immediate;   Fair Recent;   Fair Remote;   Fair  Judgement:  Fair  Insight:  Fair  Psychomotor Activity:  Normal  Concentration:  Concentration: Fair and Attention Span: Fair  Recall:  FiservFair  Fund of Knowledge: Fair  Language: Fair  Akathisia:  No  Handed:  Right  AIMS (if indicated): denies tremors, rigidity  Assets:  Communication Skills Desire for Improvement Housing Intimacy Physical Health Resilience Social Support Talents/Skills Transportation Vocational/Educational  ADL's:  Intact  Cognition: WNL  Sleep:  Restless   Screenings:   Assessment and Plan: Evans LanceHollie is a 28 year old Caucasian female, engaged, employed, lives in [place], has a history of depression, anxiety, insomnia-peripartum onset, history of seizure disorder was evaluated by telemedicine today.  Patient with psychosocial stressors of having a young baby at home, pandemic as well as upcoming wedding.  She will continue to benefit from medications as well as psychotherapy sessions.  She currently  struggles with sleep and discussed plan as noted below.  Plan For MDD-stable Lexapro 20 mg p.o. daily Continue psychotherapy sessions with Ms. Tasia Catchingsraig as needed  GAD-stable Lexapro as scheduled Hydroxyzine 12.5 to 25 mg p.o. twice daily as needed for anxiety   Insomnia- restless She reports she struggles with restless leg syndrome. We will start Requip 0.25 mg p.o. nightly for restless leg syndrome  For ADHD-she is currently on Mydayis, will continue to work with [facility].  Follow-up in clinic in 2 months or sooner if needed December 17 at 8:30 AM  I have spent atleast 15 minutes non  face to face with patient today. More than 50 % of the time was spent for psychoeducation and supportive psychotherapy and care coordination. This note was generated in part or whole with voice recognition software. Voice recognition is usually quite accurate but there are transcription errors that can and very often do occur. I apologize for any typographical errors that were not detected and corrected.       Jomarie LongsSaramma Kym Scannell, MD 02/27/2019, 11:35 AM

## 2019-03-01 ENCOUNTER — Encounter: Payer: Self-pay | Admitting: Licensed Clinical Social Worker

## 2019-03-01 ENCOUNTER — Ambulatory Visit (INDEPENDENT_AMBULATORY_CARE_PROVIDER_SITE_OTHER): Payer: BC Managed Care – PPO | Admitting: Licensed Clinical Social Worker

## 2019-03-01 ENCOUNTER — Other Ambulatory Visit: Payer: Self-pay

## 2019-03-01 DIAGNOSIS — O99345 Other mental disorders complicating the puerperium: Secondary | ICD-10-CM | POA: Diagnosis not present

## 2019-03-01 DIAGNOSIS — F411 Generalized anxiety disorder: Secondary | ICD-10-CM

## 2019-03-01 DIAGNOSIS — F329 Major depressive disorder, single episode, unspecified: Secondary | ICD-10-CM

## 2019-03-01 NOTE — Progress Notes (Signed)
Virtual Visit via Video Note  I connected with Melinda Mills on 03/01/19 at 10:00 AM EDT by a video enabled telemedicine application and verified that I am speaking with the correct person using two identifiers.   I discussed the limitations of evaluation and management by telemedicine and the availability of in person appointments. The patient expressed understanding and agreed to proceed.    I discussed the assessment and treatment plan with the patient. The patient was provided an opportunity to ask questions and all were answered. The patient agreed with the plan and demonstrated an understanding of the instructions.   The patient was advised to call back or seek an in-person evaluation if the symptoms worsen or if the condition fails to improve as anticipated.  I provided 53 minutes of non-face-to-face time during this encounter.   [name], LCSW    THERAPIST PROGRESS NOTE  Session Time: 1000  Participation Level: Active  Behavioral Response: NeatAlertAnxious  Type of Therapy: Individual Therapy  Treatment Goals addressed: Anxiety  Interventions: Supportive  Summary: Melinda Mills is a 28 y.o. female who presents with continued symptoms related to her diagnosis. Melinda Mills reports doing well since our last session, and reports her anxiety has improved significantly. She reports, "I'm not sure if it's the meds or what, but I'm a lot more relaxed than I have been." Melinda Mills reports actively trying to not "think for me and my fiance, just letting him figure things out more on his own." LCSW validated Melinda Mills's feelings and highlighted how allowing others to figure out how to navigate daily life without assistance can be liberating for both individuals. Melinda Mills reported feeling "free." She reports feeling "more laid back," and feeling a lot more happy, "I don't feel irritable anymore. I don't feel angry." LCSW validated these feelings and encouraged Melinda Mills to recognize she's put in the  effort to make these changes in her life. We also discussed how being distracted through work and also the combination of her medications could be contributing to her new feelings regarding her fiance, and ways she could also still feel in control of her tasks throughout the day while still giving up control over some aspects to her fiance. We will continue to work on improving communication and decreasing anxiety symptoms moving forward.   Suicidal/Homicidal: No   Therapist Response: Melinda Mills continues to work towards her tx goals but has not yet reached them. We will continue to work on improving CBT skills and communication moving forward.   Plan: Return again in 4 weeks.  Diagnosis: Axis I: Generalized Anxiety Disorder    Axis II: No diagnosis    [name], LCSW 03/01/2019

## 2019-03-21 ENCOUNTER — Other Ambulatory Visit: Payer: Self-pay | Admitting: Psychiatry

## 2019-03-21 DIAGNOSIS — G2581 Restless legs syndrome: Secondary | ICD-10-CM

## 2019-04-04 ENCOUNTER — Other Ambulatory Visit: Payer: Self-pay

## 2019-04-04 ENCOUNTER — Encounter: Payer: Self-pay | Admitting: Licensed Clinical Social Worker

## 2019-04-04 ENCOUNTER — Ambulatory Visit (INDEPENDENT_AMBULATORY_CARE_PROVIDER_SITE_OTHER): Payer: BC Managed Care – PPO | Admitting: Licensed Clinical Social Worker

## 2019-04-04 DIAGNOSIS — F411 Generalized anxiety disorder: Secondary | ICD-10-CM

## 2019-04-04 NOTE — Progress Notes (Signed)
Virtual Visit via Video Note  I connected with Melinda Mills on 04/04/19 at 10:00 AM EST by a video enabled telemedicine application and verified that I am speaking with the correct person using two identifiers.   I discussed the limitations of evaluation and management by telemedicine and the availability of in person appointments. The patient expressed understanding and agreed to proceed.  I discussed the assessment and treatment plan with the patient. The patient was provided an opportunity to ask questions and all were answered. The patient agreed with the plan and demonstrated an understanding of the instructions.   The patient was advised to call back or seek an in-person evaluation if the symptoms worsen or if the condition fails to improve as anticipated.  I provided 45 minutes of non-face-to-face time during this encounter.   [name], Melinda   THERAPIST PROGRESS NOTE  Session Time: 1000  Participation Level: Active  Behavioral Response: NeatAlertAnxious  Type of Therapy: Individual Therapy  Treatment Goals addressed: Anxiety  Interventions: Supportive  Summary: Melinda Mills is a 28 y.o. female who presents with continued symptoms related to her diagnosis. Melinda Mills reports doing well since our last session, and reports she is officially married. Melinda Mills on her marriage, and we discussed how the wedding day went. Melinda Mills reported having no complaints, and stated it was a lovely day. We discussed how Melinda Mills could utilize that experience in the future to reassure herself things will not be as stressful as anticipated. Melinda Mills expressed understanding and agreement. Melinda Mills reported her communication with her husband has improved significantly, and she has been actively trying to utilize less criticizing communication in order to facilitate change in their relationship. Melinda validated Melinda Mills efforts, and reminded her it was not just her that needed to make an  effort. Melinda Mills agreed and reported, at times, her family makes comments about her husband being perfect, which is upsetting because she feels she puts up with a lot as well. This led to a discussion about relationships, and specifically marriage, and how it is difficult. We discussed marriage being endless compromises and how communication is most often the culprit of problems in a relationship. Finally, we discussed Melinda Mills's low sex drive as a result of medication and having a baby. We discussed ways she could improve her desire and ways to navigate a decreased sex drive moving forward.   Suicidal/Homicidal: No   Therapist Response: Melinda Mills continues to work towards her tx goals but has not yet reached them. We will continue to work on emotional regulation skills and improving communication.   Plan: Return again in 4 weeks.  Diagnosis: Axis I: Generalized Anxiety Disorder    Axis II: No diagnosis    [name], Melinda 04/04/2019

## 2019-04-13 ENCOUNTER — Other Ambulatory Visit: Payer: Self-pay | Admitting: Psychiatry

## 2019-04-13 DIAGNOSIS — G2581 Restless legs syndrome: Secondary | ICD-10-CM

## 2019-05-07 ENCOUNTER — Ambulatory Visit (INDEPENDENT_AMBULATORY_CARE_PROVIDER_SITE_OTHER): Payer: BC Managed Care – PPO | Admitting: Licensed Clinical Social Worker

## 2019-05-07 ENCOUNTER — Encounter: Payer: Self-pay | Admitting: Licensed Clinical Social Worker

## 2019-05-07 ENCOUNTER — Other Ambulatory Visit: Payer: Self-pay

## 2019-05-07 DIAGNOSIS — F411 Generalized anxiety disorder: Secondary | ICD-10-CM

## 2019-05-07 NOTE — Progress Notes (Signed)
Virtual Visit via Video Note  I connected with Melinda Mills on 05/07/19 at 11:00 AM EST by a video enabled telemedicine application and verified that I am speaking with the correct person using two identifiers.   I discussed the limitations of evaluation and management by telemedicine and the availability of in person appointments. The patient expressed understanding and agreed to proceed.  I discussed the assessment and treatment plan with the patient. The patient was provided an opportunity to ask questions and all were answered. The patient agreed with the plan and demonstrated an understanding of the instructions.   The patient was advised to call back or seek an in-person evaluation if the symptoms worsen or if the condition fails to improve as anticipated.  I provided 60 minutes of non-face-to-face time during this encounter.   [name], LCSW    THERAPIST PROGRESS NOTE  Session Time: 1100 Participation Level: Active  Behavioral Response: NeatAlertAnxious  Type of Therapy: Individual Therapy  Treatment Goals addressed: Anxiety  Interventions: Supportive  Summary: Melinda Mills is a 28 y.o. female who presents with continued symptoms related to her diagnosis. Melinda Mills reports doing "really well," since our last session. She reports her anxiety has gotten a lot better, and she is more able to challenge negative thoughts in the moment. LCSW validated this use of coping skills and Melinda Mills's decrease in anxiety symptoms. Melinda Mills reported she is doing well with school returning, and is enjoying being at work. Melinda Mills reported she and her husband are getting along well, though at times she feels he is not listening to her. :LCSw encouraged Melinda Mills to bring this up each time she feels that's what's happening in order to bring his awareness to the issue. We discussed ways to appropriately do this without placing blame or making him feel defensive. We also discussed ways to determine what  "you can just live with," vs things you need to bring up to your partner.   Suicidal/Homicidal: No  Therapist Response: Melinda Mills continues to work towards her tx goals but has not yet reached them. We will continue to work on improving emotional regulation skills and communication moving forward.   Plan: Return again in 4 weeks.  Diagnosis: Axis I: Generalized Anxiety Disorder    Axis II: No diagnosis    [name], LCSW 05/07/2019

## 2019-05-10 ENCOUNTER — Encounter: Payer: Self-pay | Admitting: Psychiatry

## 2019-05-10 ENCOUNTER — Other Ambulatory Visit: Payer: Self-pay

## 2019-05-10 ENCOUNTER — Ambulatory Visit (INDEPENDENT_AMBULATORY_CARE_PROVIDER_SITE_OTHER): Payer: BC Managed Care – PPO | Admitting: Psychiatry

## 2019-05-10 DIAGNOSIS — F324 Major depressive disorder, single episode, in partial remission: Secondary | ICD-10-CM

## 2019-05-10 DIAGNOSIS — G2581 Restless legs syndrome: Secondary | ICD-10-CM | POA: Diagnosis not present

## 2019-05-10 DIAGNOSIS — F411 Generalized anxiety disorder: Secondary | ICD-10-CM | POA: Diagnosis not present

## 2019-05-10 DIAGNOSIS — F5105 Insomnia due to other mental disorder: Secondary | ICD-10-CM | POA: Diagnosis not present

## 2019-05-10 DIAGNOSIS — F909 Attention-deficit hyperactivity disorder, unspecified type: Secondary | ICD-10-CM

## 2019-05-10 MED ORDER — ESCITALOPRAM OXALATE 20 MG PO TABS: 20.0000 mg | ORAL_TABLET | Freq: Every day | ORAL | 1 refills | Status: DC

## 2019-05-10 NOTE — Progress Notes (Signed)
Virtual Visit via Video Note  I connected with Melinda Mills on 05/10/19 at  8:30 AM EST by a video enabled telemedicine application and verified that I am speaking with the correct person using two identifiers.   I discussed the limitations of evaluation and management by telemedicine and the availability of in person appointments. The patient expressed understanding and agreed to proceed.     I discussed the assessment and treatment plan with the patient. The patient was provided an opportunity to ask questions and all were answered. The patient agreed with the plan and demonstrated an understanding of the instructions.   The patient was advised to call back or seek an in-person evaluation if the symptoms worsen or if the condition fails to improve as anticipated.   BH MD OP Progress Note  05/10/2019 12:16 PM Melinda Mills  MRN:  [id number]  Chief Complaint:  Chief Complaint    Follow-up     HPI: Melinda Mills is a 28 year old Caucasian female, engaged, lives in [place], has a history of MDD, GAD, insomnia, history of seizure disorder was evaluated by telemedicine today.  A video call was initiated however due to connection problem it had to be changed to a phone call during the session.  Patient reports she is currently doing well with regards to her mood symptoms.  She denies any significant depressive symptoms like sadness or crying spells or lack of motivation.  She is able to focus at work.  She reports anxiety symptoms as under control on the Lexapro.  She however continues to struggle with sleep.  She reports some nights she sleeps well and other nights it is interrupted.  She does not follow good sleep hygiene.  She has been taking Requip for restless leg however she is not very compliant with that.  Patient reports work is going well although it is boring since it is remote.  Patient denies any suicidality, homicidality or perceptual disturbances.  Patient continues to follow-up  with her ADHD specialist and is currently on my days.  Patient denies any other concerns today. Visit Diagnosis:    ICD-10-CM   1. MDD (major depressive disorder), single episode, in partial remission (HCC)  F32.4 escitalopram (LEXAPRO) 20 MG tablet  2. GAD (generalized anxiety disorder)  F41.1 escitalopram (LEXAPRO) 20 MG tablet  3. Insomnia due to mental condition  F51.05   4. RLS (restless legs syndrome)  G25.81   5. Attention deficit hyperactivity disorder (ADHD), unspecified ADHD type  F90.9     Past Psychiatric History: Reviewed past psychiatric history from my progress note on 08/15/2018.  Past trials of Zoloft, Lexapro, BuSpar.  Past Medical History:  Past Medical History:  Diagnosis Date  . ADHD (attention deficit hyperactivity disorder)   . Depression   . Seizures (HCC)     Past Surgical History:  Procedure Laterality Date  . APPENDECTOMY  2008/2009    Family Psychiatric History: Reviewed family psychiatric history from my progress note on 08/15/2018.  Family History:  Family History  Adopted: Yes    Social History: Reviewed social history from my progress note on 08/15/2018. Social History   Socioeconomic History  . Marital status: Significant Other    Spouse name: Not on file  . Number of children: 1  . Years of education: Not on file  . Highest education level: Bachelor's degree (e.g., BA, AB, BS)  Occupational History  . Not on file  Tobacco Use  . Smoking status: Never Smoker  . Smokeless tobacco: Never Used  Substance and Sexual Activity  . Alcohol use: Not Currently  . Drug use: Not Currently    Types: Marijuana  . Sexual activity: Yes  Other Topics Concern  . Not on file  Social History Narrative  . Not on file   Social Determinants of Health   Financial Resource Strain: Low Risk   . Difficulty of Paying Living Expenses: Not very hard  Food Insecurity:   . Worried About Charity fundraiser in the Last Year: Not on file  . Ran Out of Food  in the Last Year: Not on file  Transportation Needs:   . Lack of Transportation (Medical): Not on file  . Lack of Transportation (Non-Medical): Not on file  Physical Activity:   . Days of Exercise per Week: Not on file  . Minutes of Exercise per Session: Not on file  Stress:   . Feeling of Stress : Not on file  Social Connections: Unknown  . Frequency of Communication with Friends and Family: Not on file  . Frequency of Social Gatherings with Friends and Family: Not on file  . Attends Religious Services: Not on file  . Active Member of Clubs or Organizations: Not on file  . Attends Archivist Meetings: Not on file  . Marital Status: Living with partner    Allergies: No Known Allergies  Metabolic Disorder Labs: No results found for: HGBA1C, MPG No results found for: PROLACTIN No results found for: CHOL, TRIG, HDL, CHOLHDL, VLDL, LDLCALC Lab Results  Component Value Date   TSH 3.640 08/15/2018    Therapeutic Level Labs: No results found for: LITHIUM No results found for: VALPROATE No components found for:  CBMZ  Current Medications: Current Outpatient Medications  Medication Sig Dispense Refill  . amphetamine-dextroamphetamine (ADDERALL XR) 15 MG 24 hr capsule     . escitalopram (LEXAPRO) 20 MG tablet Take 1 tablet (20 mg total) by mouth daily. 90 tablet 1  . hydrOXYzine (ATARAX/VISTARIL) 25 MG tablet TAKE 1/2-1 TABLETS (12.5-25 MG TOTAL) BY MOUTH 2 (TWO) TIMES DAILY AS NEEDED. 180 tablet 1  . MYDAYIS 12.5 MG CP24     . MYDAYIS 25 MG CP24 Take 1 capsule by mouth every morning.    . norethindrone (MICRONOR) 0.35 MG tablet     . norgestimate-ethinyl estradiol (ORTHO-CYCLEN) 0.25-35 MG-MCG tablet Take by mouth.    . Norgestimate-Ethinyl Estradiol Triphasic 0.18/0.215/0.25 MG-35 MCG tablet Take by mouth.    Marland Kitchen rOPINIRole (REQUIP) 0.25 MG tablet TAKE 1 TABLET (0.25 MG TOTAL) BY MOUTH AT BEDTIME. 30 tablet 1  . SPRINTEC 28 0.25-35 MG-MCG tablet      No current  facility-administered medications for this visit.     Musculoskeletal: Strength & Muscle Tone: UTA Gait & Station: Reports as WNL Patient leans: N/A  Psychiatric Specialty Exam: Review of Systems  Psychiatric/Behavioral: Positive for sleep disturbance.  All other systems reviewed and are negative.   not currently breastfeeding.There is no height or weight on file to calculate BMI.  General Appearance: UTA  Eye Contact:  UTA  Speech:  Clear and Coherent  Volume:  Normal  Mood:  Euthymic  Affect:  UTA  Thought Process:  Goal Directed and Descriptions of Associations: Intact  Orientation:  Full (Time, Place, and Person)  Thought Content: Logical   Suicidal Thoughts:  No  Homicidal Thoughts:  No  Memory:  Immediate;   Fair Recent;   Fair Remote;   Fair  Judgement:  Fair  Insight:  Fair  Psychomotor Activity:  Normal  Concentration:  Concentration: Fair and Attention Span: Fair  Recall:  [employer] of Knowledge: Fair  Language: Fair  Akathisia:  No  Handed:  Right  AIMS (if indicated):denies tremors, rigidity  Assets:  Communication Skills Desire for Improvement Social Support  ADL's:  Intact  Cognition: WNL  Sleep:  Restless   Screenings:   Assessment and Plan: Melinda Mills is a 28 year old Caucasian female, engaged, employed, lives in [place], has a history of depression, anxiety, insomnia-peripartum onset, history of seizure disorder was evaluated by telemedicine today.  Patient with psychosocial stressors of the pandemic, having a young baby at home.  Patient is currently making progress with regards to her mood however continues to struggle with sleep.  Plan as noted below.  Plan MDD in partial remission Lexapro 20 mg p.o. daily Continue psychotherapy sessions with Ms. Tasia Catchings as needed  GAD-stable Lexapro as scheduled Hydroxyzine 12.5 to 25 mg p.o. twice daily as needed for anxiety.  Insomnia-restless Discussed sleep hygiene techniques. Advised patient to  take Requip 0.25 mg p.o. nightly regularly.-She has been noncompliant. Discussed melatonin over-the-counter.  For ADHD-she is currently on  Mydayis and will continue to work with [facility] attention specialist.  Follow-up in clinic in 2 months or sooner if needed.  February 19 at 8:30 AM  I have spent atleast 15 minutes non face to face with patient today. More than 50 % of the time was spent for psychoeducation and supportive psychotherapy and care coordination. This note was generated in part or whole with voice recognition software. Voice recognition is usually quite accurate but there are transcription errors that can and very often do occur. I apologize for any typographical errors that were not detected and corrected.       Jomarie Longs, MD 05/10/2019, 12:16 PM

## 2019-05-12 ENCOUNTER — Other Ambulatory Visit: Payer: Self-pay | Admitting: Psychiatry

## 2019-05-12 DIAGNOSIS — G2581 Restless legs syndrome: Secondary | ICD-10-CM

## 2019-06-06 ENCOUNTER — Other Ambulatory Visit: Payer: Self-pay | Admitting: Psychiatry

## 2019-06-06 DIAGNOSIS — G2581 Restless legs syndrome: Secondary | ICD-10-CM

## 2019-06-07 ENCOUNTER — Ambulatory Visit (INDEPENDENT_AMBULATORY_CARE_PROVIDER_SITE_OTHER): Payer: BC Managed Care – PPO | Admitting: Licensed Clinical Social Worker

## 2019-06-07 ENCOUNTER — Encounter: Payer: Self-pay | Admitting: Licensed Clinical Social Worker

## 2019-06-07 ENCOUNTER — Other Ambulatory Visit: Payer: Self-pay

## 2019-06-07 DIAGNOSIS — F411 Generalized anxiety disorder: Secondary | ICD-10-CM

## 2019-06-07 DIAGNOSIS — F324 Major depressive disorder, single episode, in partial remission: Secondary | ICD-10-CM | POA: Diagnosis not present

## 2019-06-07 NOTE — Progress Notes (Signed)
Virtual Visit via Telephone Note  I connected with Melinda Mills on 06/07/19 at  2:30 PM EST by telephone and verified that I am speaking with the correct person using two identifiers.   I discussed the limitations, risks, security and privacy concerns of performing an evaluation and management service by telephone and the availability of in person appointments. I also discussed with the patient that there may be a patient responsible charge related to this service. The patient expressed understanding and agreed to proceed.  I discussed the assessment and treatment plan with the patient. The patient was provided an opportunity to ask questions and all were answered. The patient agreed with the plan and demonstrated an understanding of the instructions.   The patient was advised to call back or seek an in-person evaluation if the symptoms worsen or if the condition fails to improve as anticipated.  I provided  32 minutes of non-face-to-face time during this encounter.   [name], LCSW    THERAPIST PROGRESS NOTE  Session Time: 1430  Participation Level: Active  Behavioral Response: NeatAlertNA  Type of Therapy: Individual Therapy  Treatment Goals addressed: Coping  Interventions: Supportive  Summary: Melinda Mills is a 29 y.o. female who presents with continued symptoms related to her diagnosis. San reports doing really well since our last session. Melinda Mills reports she has been getting along really well with her husband, and they have been communicating well. Melinda Mills reports her anxiety has been much more manageable recently as she has started engaging in art more recently. "When I was in highschool, I did a lot of art and I had no stress or anxiety at all." Melinda Mills reports she got an i-pencil  For Christmas and has been drawing digitally. 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 LCSW validated art as a coping mechanism and encouraged Melinda Mills to recognize the effort/insight it took to recognize what was missing from her life and how to introduce it again. Melinda Mills was able to recognize this as well. LCSW encouraged Melinda Mills to recognize the work she's put into her mental health, and how that is paying off now. Melinda Mills expressed understanding and agreement with this idea as well. We discussed ways to continue to utilize art to manage anxiety symptoms moving forward.   Suicidal/Homicidal: No   Therapist Response: Melinda Mills continues to work towards her tx goals but has not yet reached them. We will continue to work on emotional regulation and distress tolerance skills.   Plan: Return again in 4 weeks.  Diagnosis: Axis I: Generalized Anxiety Disorder    Axis II: No diagnosis    [name], LCSW 06/07/2019

## 2019-06-26 ENCOUNTER — Ambulatory Visit: Payer: BC Managed Care – PPO | Attending: Internal Medicine

## 2019-06-26 DIAGNOSIS — Z20822 Contact with and (suspected) exposure to covid-19: Secondary | ICD-10-CM

## 2019-06-27 LAB — NOVEL CORONAVIRUS, NAA: SARS-CoV-2, NAA: NOT DETECTED

## 2019-07-04 ENCOUNTER — Other Ambulatory Visit: Payer: Self-pay | Admitting: Psychiatry

## 2019-07-04 DIAGNOSIS — G2581 Restless legs syndrome: Secondary | ICD-10-CM

## 2019-07-09 ENCOUNTER — Other Ambulatory Visit: Payer: Self-pay

## 2019-07-09 ENCOUNTER — Ambulatory Visit: Payer: BC Managed Care – PPO | Admitting: Licensed Clinical Social Worker

## 2019-07-13 ENCOUNTER — Encounter: Payer: Self-pay | Admitting: Psychiatry

## 2019-07-13 ENCOUNTER — Other Ambulatory Visit: Payer: Self-pay

## 2019-07-13 ENCOUNTER — Ambulatory Visit (INDEPENDENT_AMBULATORY_CARE_PROVIDER_SITE_OTHER): Payer: BC Managed Care – PPO | Admitting: Psychiatry

## 2019-07-13 DIAGNOSIS — F411 Generalized anxiety disorder: Secondary | ICD-10-CM

## 2019-07-13 DIAGNOSIS — F909 Attention-deficit hyperactivity disorder, unspecified type: Secondary | ICD-10-CM

## 2019-07-13 DIAGNOSIS — G2581 Restless legs syndrome: Secondary | ICD-10-CM | POA: Diagnosis not present

## 2019-07-13 DIAGNOSIS — F5105 Insomnia due to other mental disorder: Secondary | ICD-10-CM | POA: Diagnosis not present

## 2019-07-13 DIAGNOSIS — F3342 Major depressive disorder, recurrent, in full remission: Secondary | ICD-10-CM

## 2019-07-13 DIAGNOSIS — F324 Major depressive disorder, single episode, in partial remission: Secondary | ICD-10-CM | POA: Insufficient documentation

## 2019-07-13 NOTE — Progress Notes (Signed)
Provider Location : [facility] Patient Location : Home  Virtual Visit via Telephone Note  I connected with Melinda Mills on 07/13/19 at  8:30 AM EST by telephone and verified that I am speaking with the correct person using two identifiers.   I discussed the limitations, risks, security and privacy concerns of performing an evaluation and management service by telephone and the availability of in person appointments. I also discussed with the patient that there may be a patient responsible charge related to this service. The patient expressed understanding and agreed to proceed.    I discussed the assessment and treatment plan with the patient. The patient was provided an opportunity to ask questions and all were answered. The patient agreed with the plan and demonstrated an understanding of the instructions.   The patient was advised to call back or seek an in-person evaluation if the symptoms worsen or if the condition fails to improve as anticipated.   [facility]/NP OP Progress Note  07/13/2019 10:57 AM Melinda Mills  MRN:  [id number]  Chief Complaint:  Chief Complaint    Follow-up     HPI: Melinda Mills is a 29 year old Caucasian female, engaged, lives in [place], has a history of MDD, GAD, insomnia, history of seizure disorder was evaluated by phone today.  Patient preferred to do a phone call due to correction problems with video at her location.  Patient today reports she is currently doing well on the current medication regimen.  She reports work is going well.  She is sleeping okay.  She is able to focus and concentrate at work.  She denies suicidality, homicidality or perceptual disturbances.  Patient denies any other concerns today. Visit Diagnosis:    ICD-10-CM   1. MDD (major depressive disorder), recurrent, in full remission ([place])  F33.42   2. GAD (generalized anxiety disorder)  F41.1   3. Insomnia due to mental condition  F51.05   4. RLS (restless legs syndrome)  G25.81    5. Attention deficit hyperactivity disorder (ADHD), unspecified ADHD type  F90.9     Past Psychiatric History: I have reviewed past psychiatric history from my progress note on 08/15/2018.  Past trials of Zoloft, Lexapro, BuSpar.  Past Medical History:  Past Medical History:  Diagnosis Date  . ADHD (attention deficit hyperactivity disorder)   . Depression   . Seizures ([place])     Past Surgical History:  Procedure Laterality Date  . APPENDECTOMY  2008/2009    Family Psychiatric History: I have reviewed family psychiatric history from my progress note on 08/15/2018.  Family History:  Family History  Adopted: Yes    Social History: I have reviewed social history from my progress note on 08/15/2018. Social History   Socioeconomic History  . Marital status: Married    Spouse name: Not on file  . Number of children: 1  . Years of education: Not on file  . Highest education level: Bachelor's degree (e.g., BA, AB, BS)  Occupational History  . Not on file  Tobacco Use  . Smoking status: Never Smoker  . Smokeless tobacco: Never Used  Substance and Sexual Activity  . Alcohol use: Not Currently  . Drug use: Not Currently    Types: Marijuana  . Sexual activity: Yes  Other Topics Concern  . Not on file  Social History Narrative  . Not on file   Social Determinants of Health   Financial Resource Strain: Low Risk   . Difficulty of Paying Living Expenses: Not very hard  Food Insecurity:   .  Worried About Programme researcher, broadcasting/film/video in the Last Year: Not on file  . Ran Out of Food in the Last Year: Not on file  Transportation Needs:   . Lack of Transportation (Medical): Not on file  . Lack of Transportation (Non-Medical): Not on file  Physical Activity:   . Days of Exercise per Week: Not on file  . Minutes of Exercise per Session: Not on file  Stress:   . Feeling of Stress : Not on file  Social Connections: Unknown  . Frequency of Communication with Friends and Family: Not on file   . Frequency of Social Gatherings with Friends and Family: Not on file  . Attends Religious Services: Not on file  . Active Member of Clubs or Organizations: Not on file  . Attends Banker Meetings: Not on file  . Marital Status: Living with partner    Allergies: No Known Allergies  Metabolic Disorder Labs: No results found for: HGBA1C, MPG No results found for: PROLACTIN No results found for: CHOL, TRIG, HDL, CHOLHDL, VLDL, LDLCALC Lab Results  Component Value Date   TSH 3.640 08/15/2018    Therapeutic Level Labs: No results found for: LITHIUM No results found for: VALPROATE No components found for:  CBMZ  Current Medications: Current Outpatient Medications  Medication Sig Dispense Refill  . Amphet-Dextroamphet 3-Bead ER (MYDAYIS) 12.5 MG CP24     . escitalopram (LEXAPRO) 20 MG tablet Take by mouth.    . norethindrone-ethinyl estradiol (OVCON-35) 0.4-35 MG-MCG tablet Take by mouth.    Marland Kitchen amphetamine-dextroamphetamine (ADDERALL XR) 15 MG 24 hr capsule     . BALZIVA 0.4-35 MG-MCG tablet Take 1 tablet by mouth daily.    Marland Kitchen escitalopram (LEXAPRO) 20 MG tablet Take 1 tablet (20 mg total) by mouth daily. 90 tablet 1  . hydrOXYzine (ATARAX/VISTARIL) 25 MG tablet TAKE 1/2-1 TABLETS (12.5-25 MG TOTAL) BY MOUTH 2 (TWO) TIMES DAILY AS NEEDED. 180 tablet 1  . MYDAYIS 12.5 MG CP24     . MYDAYIS 25 MG CP24 Take 1 capsule by mouth every morning.    . norethindrone (MICRONOR) 0.35 MG tablet     . norgestimate-ethinyl estradiol (ORTHO-CYCLEN) 0.25-35 MG-MCG tablet Take by mouth.    . Norgestimate-Ethinyl Estradiol Triphasic 0.18/0.215/0.25 MG-35 MCG tablet Take by mouth.    Marland Kitchen rOPINIRole (REQUIP) 0.25 MG tablet TAKE 1 TABLET (0.25 MG TOTAL) BY MOUTH AT BEDTIME. 30 tablet 1  . SPRINTEC 28 0.25-35 MG-MCG tablet      No current facility-administered medications for this visit.     Musculoskeletal: Strength & Muscle Tone: UTA Gait & Station: Reports as WNL Patient leans:  N/A  Psychiatric Specialty Exam: Review of Systems  Psychiatric/Behavioral: Negative for agitation, behavioral problems, confusion, decreased concentration, dysphoric mood, hallucinations, self-injury, sleep disturbance and suicidal ideas. The patient is not nervous/anxious and is not hyperactive.   All other systems reviewed and are negative.   not currently breastfeeding.There is no height or weight on file to calculate BMI.  General Appearance: UTA  Eye Contact:  UTA  Speech:  Clear and Coherent  Volume:  Normal  Mood:  Euthymic  Affect:  UTA  Thought Process:  Goal Directed and Descriptions of Associations: Intact  Orientation:  Full (Time, Place, and Person)  Thought Content: Logical   Suicidal Thoughts:  No  Homicidal Thoughts:  No  Memory:  Immediate;   Fair Recent;   Fair Remote;   Fair  Judgement:  Fair  Insight:  Fair  Psychomotor Activity:  UTA  Concentration:  Concentration: Fair and Attention Span: Fair  Recall:  [employer] of Knowledge: Fair  Language: Fair  Akathisia:  No  Handed:  Right  AIMS (if indicated): Denies tremors, rigidity  Assets:  Communication Skills Desire for Improvement Housing Intimacy Social Support Talents/Skills Transportation Vocational/Educational  ADL's:  Intact  Cognition: WNL  Sleep:  Fair   Screenings:   Assessment and Plan: Melinda Mills is a 29 year old Caucasian female, engaged, employed, lives in [place], has a history of depression, anxiety, insomnia-peripartum onset, history of seizure disorder was evaluated by telemedicine today.  Patient with psychosocial stressors of the pandemic is currently stable on medications.  Plan as noted below.  Plan MDD in full remission Lexapro 20 mg p.o. daily Continue psychotherapy sessions as needed with Ms. Tasia Catchings  GAD-stable Lexapro schedule Hydroxyzine 12.5 to 25 mg p.o. twice daily as needed for anxiety.  Insomnia-stable Continue Requip 0.25 mg p.o. nightly. Melatonin  over-the-counter as needed  ADHD-she is currently on Mydayis and will continue to work with [place] attention specialist.  Follow-up in clinic in 3 months or sooner if needed.  May 14 at 8:30 AM  I have spent atleast 20 minutes non face to face with patient today. More than 50 % of the time was spent for ordering medications and test ,psychoeducation and supportive psychotherapy and care coordination,as well as documenting clinical information in electronic health record. This note was generated in part or whole with voice recognition software. Voice recognition is usually quite accurate but there are transcription errors that can and very often do occur. I apologize for any typographical errors that were not detected and corrected.       Jomarie Longs, MD 07/13/2019, 10:57 AM

## 2019-07-27 ENCOUNTER — Other Ambulatory Visit: Payer: Self-pay | Admitting: Psychiatry

## 2019-07-27 DIAGNOSIS — G2581 Restless legs syndrome: Secondary | ICD-10-CM

## 2019-08-08 ENCOUNTER — Encounter: Payer: Self-pay | Admitting: Licensed Clinical Social Worker

## 2019-08-08 ENCOUNTER — Ambulatory Visit (INDEPENDENT_AMBULATORY_CARE_PROVIDER_SITE_OTHER): Payer: BC Managed Care – PPO | Admitting: Licensed Clinical Social Worker

## 2019-08-08 ENCOUNTER — Other Ambulatory Visit: Payer: Self-pay

## 2019-08-08 DIAGNOSIS — F3342 Major depressive disorder, recurrent, in full remission: Secondary | ICD-10-CM | POA: Diagnosis not present

## 2019-08-08 DIAGNOSIS — F411 Generalized anxiety disorder: Secondary | ICD-10-CM

## 2019-08-08 NOTE — Progress Notes (Signed)
Virtual Visit via Video Note  I connected with Melinda Mills on 08/08/19 at  8:00 AM EDT by a video enabled telemedicine application and verified that I am speaking with the correct person using two identifiers.   I discussed the limitations of evaluation and management by telemedicine and the availability of in person appointments. The patient expressed understanding and agreed to proceed.    I discussed the assessment and treatment plan with the patient. The patient was provided an opportunity to ask questions and all were answered. The patient agreed with the plan and demonstrated an understanding of the instructions.   The patient was advised to call back or seek an in-person evaluation if the symptoms worsen or if the condition fails to improve as anticipated.  I provided 60 minutes of non-face-to-face time during this encounter.   [name], LCSW    THERAPIST PROGRESS NOTE  Session Time: 0800  Participation Level: Active  Behavioral Response: CasualAlertAnxious  Type of Therapy: Individual Therapy  Treatment Goals addressed: Coping  Interventions: Strength-based and Supportive  Summary: Melinda Mills is a 29 y.o. female who presents with continued symptoms related to her diagnosis. Melinda Mills reports doing well since our last session. She reports continued improvements regarding her anxiety symptoms, and notes she has moments of anxiety but feels able to handle it in the moment. LCSW validated Melinda Mills's feelings, and asked her to provide information on times she got anxious and how she handled it. Versie was able to cite several examples where she was able to recognize what was making her anxious, challenge her thoughts, and move on with her day. LCSW validated Melinda Mills's feelings and celebrated her progress in these moments. Melinda Mills reported she is getting along much better with her husband, and she has felt much less irritable recently. "It's like I was tense all the time before  and now I'm not." LCSW validated these ideas and encouraged Melinda Mills to recognize a lot of this is a direct result of her effort in improving her mental health. Melinda Mills was able to recognize this as well and expressed agreement. Melinda Mills also noted she has started a fitness and nutrition program which she stated has been very beneficial. LCSW validated this approach and encouraged Melinda Mills to pay attention to her mental health through the process as well.    Suicidal/Homicidal: No  Therapist Response: Melinda Mills continues to work towards her tx goals but has not yet reached them. We will continue to work on improving emotional regulation and distress tolerance moving forward.   Plan: Return again in 4 weeks.  Diagnosis: Axis I: Generalized Anxiety Disorder    Axis II: No diagnosis    [name], LCSW 08/08/2019

## 2019-08-09 ENCOUNTER — Other Ambulatory Visit: Payer: Self-pay | Admitting: Psychiatry

## 2019-08-09 DIAGNOSIS — G2581 Restless legs syndrome: Secondary | ICD-10-CM

## 2019-09-10 ENCOUNTER — Ambulatory Visit: Payer: BC Managed Care – PPO | Admitting: Licensed Clinical Social Worker

## 2019-10-01 ENCOUNTER — Ambulatory Visit: Payer: BC Managed Care – PPO | Attending: Internal Medicine

## 2019-10-01 DIAGNOSIS — Z20822 Contact with and (suspected) exposure to covid-19: Secondary | ICD-10-CM

## 2019-10-02 LAB — NOVEL CORONAVIRUS, NAA: SARS-CoV-2, NAA: NOT DETECTED

## 2019-10-02 LAB — SARS-COV-2, NAA 2 DAY TAT

## 2019-10-05 ENCOUNTER — Other Ambulatory Visit: Payer: Self-pay

## 2019-10-05 ENCOUNTER — Telehealth (INDEPENDENT_AMBULATORY_CARE_PROVIDER_SITE_OTHER): Payer: BC Managed Care – PPO | Admitting: Psychiatry

## 2019-10-05 ENCOUNTER — Encounter: Payer: Self-pay | Admitting: Psychiatry

## 2019-10-05 DIAGNOSIS — F411 Generalized anxiety disorder: Secondary | ICD-10-CM | POA: Diagnosis not present

## 2019-10-05 DIAGNOSIS — F5105 Insomnia due to other mental disorder: Secondary | ICD-10-CM | POA: Diagnosis not present

## 2019-10-05 DIAGNOSIS — F3342 Major depressive disorder, recurrent, in full remission: Secondary | ICD-10-CM

## 2019-10-05 DIAGNOSIS — F909 Attention-deficit hyperactivity disorder, unspecified type: Secondary | ICD-10-CM

## 2019-10-05 DIAGNOSIS — G2581 Restless legs syndrome: Secondary | ICD-10-CM | POA: Diagnosis not present

## 2019-10-05 MED ORDER — ESCITALOPRAM OXALATE 20 MG PO TABS: 20.0000 mg | ORAL_TABLET | Freq: Every day | ORAL | 1 refills | Status: DC

## 2019-10-05 NOTE — Progress Notes (Addendum)
Provider Location : [facility] Patient Location : Home  Virtual Visit via Video Note  I connected with Melinda Mills on 10/05/19 at  8:30 AM EDT by a video enabled telemedicine application and verified that I am speaking with the correct person using two identifiers.   I discussed the limitations of evaluation and management by telemedicine and the availability of in person appointments. The patient expressed understanding and agreed to proceed.     I discussed the assessment and treatment plan with the patient. The patient was provided an opportunity to ask questions and all were answered. The patient agreed with the plan and demonstrated an understanding of the instructions.   The patient was advised to call back or seek an in-person evaluation if the symptoms worsen or if the condition fails to improve as anticipated.   [facility] MD OP Progress Note  10/05/2019 12:22 PM Melinda Mills  MRN:  [id number]  Chief Complaint:  Chief Complaint    Follow-up     HPI: Melinda Mills is a 29 year old Caucasian female, married, lives in [place], has a history of MDD, GAD, insomnia, history of seizure disorder was evaluated by telemedicine today.  Patient today reports she is currently doing well on the current medication regimen.  Patient reports mood symptoms are stable.  She is able to cope with her work-related stressors.  She reports school is going to close in 2 weeks and she looks forward to spending her summer with her family.  She reports sleep as good.  She reports her focus and concentration is good, she continues to be on stimulants  for her ADHD.  She reports she has been compliant on the Lexapro.  She no longer takes the Requip since she felt she did not need it.  She has been exercising and following a diet plan.  She however reports she has difficulty losing weight and she wonders if the Lexapro could be contributing to it.  Patient however wants to stay on the same.  Patient denies  any suicidality, homicidality or perceptual disturbances.  Patient denies any other concerns today.    Visit Diagnosis:    ICD-10-CM   1. MDD (major depressive disorder), recurrent, in full remission ([facility])  F33.42 escitalopram (LEXAPRO) 20 MG tablet  2. GAD (generalized anxiety disorder)  F41.1 escitalopram (LEXAPRO) 20 MG tablet  3. Insomnia due to mental condition  F51.05   4. RLS (restless legs syndrome)  G25.81   5. Attention deficit hyperactivity disorder (ADHD), unspecified ADHD type  F90.9     Past Psychiatric History: I have reviewed past psychiatric history from my progress note on 08/15/2018.  Past trials of Zoloft, Lexapro, BuSpar.  Past Medical History:  Past Medical History:  Diagnosis Date  . ADHD (attention deficit hyperactivity disorder)   . Depression   . Seizures ([facility])     Past Surgical History:  Procedure Laterality Date  . APPENDECTOMY  2008/2009    Family Psychiatric History: I have reviewed family psychiatric history from my progress note on 08/15/2018.  Family History:  Family History  Adopted: Yes    Social History: I have reviewed social history from my progress note on 08/15/2018. Social History   Socioeconomic History  . Marital status: Married    Spouse name: Not on file  . Number of children: 1  . Years of education: Not on file  . Highest education level: Bachelor's degree (e.g., BA, AB, BS)  Occupational History  . Not on file  Tobacco Use  . Smoking  status: Never Smoker  . Smokeless tobacco: Never Used  Substance and Sexual Activity  . Alcohol use: Not Currently  . Drug use: Not Currently    Types: Marijuana  . Sexual activity: Yes  Other Topics Concern  . Not on file  Social History Narrative  . Not on file   Social Determinants of Health   Financial Resource Strain:   . Difficulty of Paying Living Expenses:   Food Insecurity:   . Worried About Programme researcher, broadcasting/film/video in the Last Year:   . Barista in the Last Year:    Transportation Needs:   . Freight forwarder (Medical):   Marland Kitchen Lack of Transportation (Non-Medical):   Physical Activity:   . Days of Exercise per Week:   . Minutes of Exercise per Session:   Stress:   . Feeling of Stress :   Social Connections:   . Frequency of Communication with Friends and Family:   . Frequency of Social Gatherings with Friends and Family:   . Attends Religious Services:   . Active Member of Clubs or Organizations:   . Attends Banker Meetings:   Marland Kitchen Marital Status:     Allergies: No Known Allergies  Metabolic Disorder Labs: No results found for: HGBA1C, MPG No results found for: PROLACTIN No results found for: CHOL, TRIG, HDL, CHOLHDL, VLDL, LDLCALC Lab Results  Component Value Date   TSH 3.640 08/15/2018    Therapeutic Level Labs: No results found for: LITHIUM No results found for: VALPROATE No components found for:  CBMZ  Current Medications: Current Outpatient Medications  Medication Sig Dispense Refill  . norelgestromin-ethinyl estradiol (ORTHO EVRA) 150-35 MCG/24HR transdermal patch Place onto the skin.    . Amphet-Dextroamphet 3-Bead ER (MYDAYIS) 12.5 MG CP24     . amphetamine-dextroamphetamine (ADDERALL XR) 15 MG 24 hr capsule     . BALZIVA 0.4-35 MG-MCG tablet Take 1 tablet by mouth daily.    Marland Kitchen escitalopram (LEXAPRO) 20 MG tablet Take 1 tablet (20 mg total) by mouth daily. 90 tablet 1  . hydrOXYzine (ATARAX/VISTARIL) 25 MG tablet TAKE 1/2-1 TABLETS (12.5-25 MG TOTAL) BY MOUTH 2 (TWO) TIMES DAILY AS NEEDED. 180 tablet 1  . MYDAYIS 12.5 MG CP24     . MYDAYIS 25 MG CP24 Take 1 capsule by mouth every morning.    . norethindrone (MICRONOR) 0.35 MG tablet     . norethindrone-ethinyl estradiol (OVCON-35) 0.4-35 MG-MCG tablet Take by mouth.    . norgestimate-ethinyl estradiol (ORTHO-CYCLEN) 0.25-35 MG-MCG tablet Take by mouth.    . Norgestimate-Ethinyl Estradiol Triphasic 0.18/0.215/0.25 MG-35 MCG tablet Take by mouth.    . SPRINTEC  28 0.25-35 MG-MCG tablet      No current facility-administered medications for this visit.     Musculoskeletal: Strength & Muscle Tone: UTA Gait & Station: normal Patient leans: N/A  Psychiatric Specialty Exam: Review of Systems  Psychiatric/Behavioral: Negative for agitation, behavioral problems, confusion, decreased concentration, dysphoric mood, hallucinations, self-injury, sleep disturbance and suicidal ideas. The patient is not nervous/anxious and is not hyperactive.   All other systems reviewed and are negative.   not currently breastfeeding.There is no height or weight on file to calculate BMI.  General Appearance: Casual  Eye Contact:  Fair  Speech:  Normal Rate  Volume:  Normal  Mood:  Euthymic  Affect:  Congruent  Thought Process:  Goal Directed and Descriptions of Associations: Intact  Orientation:  Full (Time, Place, and Person)  Thought Content: Logical   Suicidal  Thoughts:  No  Homicidal Thoughts:  No  Memory:  Immediate;   Fair Recent;   Fair Remote;   Fair  Judgement:  Fair  Insight:  Fair  Psychomotor Activity:  Normal  Concentration:  Concentration: Fair and Attention Span: Fair  Recall:  [employer] of Knowledge: Fair  Language: Fair  Akathisia:  No  Handed:  Right  AIMS (if indicated): UTA  Assets:  Communication Skills Desire for Improvement Housing Social Support  ADL's:  Intact  Cognition: WNL  Sleep:  Fair   Screenings:   Assessment and Plan: Melinda Mills is a 29 year old Caucasian female, married, employed, lives in [place], has a history of depression, anxiety, insomnia, history of seizure disorder was evaluated by telemedicine today.  Patient with psychosocial stressors of the pandemic, currently stable on medications.  Plan as noted below.  Plan MDD in full remission Lexapro 20 mg p.o. daily Continue CBT as needed.  Patient is aware that her therapist is not at this practice anymore and she will establish care with another  therapist.  GAD-stable Lexapro as scheduled. Hydroxyzine 12.5 to 25 mg p.o. twice daily as needed for anxiety.  Insomnia-stable Discontinue Requip for noncompliance Melatonin over-the-counter as needed  ADHD-she is currently on Mydayis and will continue to follow-up with [place] attention specialist.  Follow-up in clinic in 3 months or sooner if needed.  I have spent atleast 20 minutes non face to face with patient today. More than 50 % of the time was spent for preparing to see the patient ( e.g., review of test, records ),  ordering medications and test ,psychoeducation and supportive psychotherapy and care coordination,as well as documenting clinical information in electronic health record. This note was generated in part or whole with voice recognition software. Voice recognition is usually quite accurate but there are transcription errors that can and very often do occur. I apologize for any typographical errors that were not detected and corrected.        Ursula Alert, MD 10/05/2019, 12:22 PM

## 2019-12-20 ENCOUNTER — Encounter: Payer: Self-pay | Admitting: Psychiatry

## 2019-12-20 ENCOUNTER — Other Ambulatory Visit: Payer: Self-pay

## 2019-12-20 ENCOUNTER — Telehealth (INDEPENDENT_AMBULATORY_CARE_PROVIDER_SITE_OTHER): Payer: BC Managed Care – PPO | Admitting: Psychiatry

## 2019-12-20 DIAGNOSIS — G2581 Restless legs syndrome: Secondary | ICD-10-CM

## 2019-12-20 DIAGNOSIS — F5105 Insomnia due to other mental disorder: Secondary | ICD-10-CM

## 2019-12-20 DIAGNOSIS — F902 Attention-deficit hyperactivity disorder, combined type: Secondary | ICD-10-CM

## 2019-12-20 DIAGNOSIS — F411 Generalized anxiety disorder: Secondary | ICD-10-CM | POA: Diagnosis not present

## 2019-12-20 DIAGNOSIS — F3342 Major depressive disorder, recurrent, in full remission: Secondary | ICD-10-CM | POA: Insufficient documentation

## 2019-12-20 MED ORDER — ESCITALOPRAM OXALATE 10 MG PO TABS: 10.0000 mg | ORAL_TABLET | Freq: Every day | ORAL | 0 refills | Status: DC

## 2019-12-20 NOTE — Progress Notes (Signed)
Provider Location : [facility] Patient Location : Home  Virtual Visit via Video Note  I connected with Melinda Mills on 12/20/19 at 11:00 AM EDT by a video enabled telemedicine application and verified that I am speaking with the correct person using two identifiers.   I discussed the limitations of evaluation and management by telemedicine and the availability of in person appointments. The patient expressed understanding and agreed to proceed.     I discussed the assessment and treatment plan with the patient. The patient was provided an opportunity to ask questions and all were answered. The patient agreed with the plan and demonstrated an understanding of the instructions.   The patient was advised to call back or seek an in-person evaluation if the symptoms worsen or if the condition fails to improve as anticipated.   BH MD OP Progress Note  12/20/2019 8:23 PM Melinda Mills  MRN:  [id number]  Chief Complaint:  Chief Complaint    Follow-up     HPI: Melinda Mills is a 29 year old Caucasian female, engaged, lives in [place], has a history of MDD, GAD, insomnia, history of seizure disorder was evaluated by telemedicine today.  Patient today reports she is currently doing well with regards to her depression and anxiety symptoms.  She continues to take Lexapro and denies side effects.  Patient reports sleep continues to be good.  Patient reports her attention and focus is good on the mydayis.  She reports she continues to follow-up with her ADHD specialist.  She was initially started on 12.5 and then it was increased to 25 mg.  She reports that helped her to focus throughout the day however she felt like it affected her sleep since she was taking it too late in the morning.  She reports she realized that she does not need that higher dosage during the summer and went back to the 12.5 mg.  So far she is doing well.  Patient denies any suicidality, homicidality or perceptual  disturbances.  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  She reports that does make her anxious however she is motivated to get back to work.  Patient denies any other concerns today.  Visit Diagnosis:    ICD-10-CM   1. MDD (major depressive disorder), recurrent, in full remission (HCC)  F33.42 escitalopram (LEXAPRO) 10 MG tablet  2. GAD (generalized anxiety disorder)  F41.1 escitalopram (LEXAPRO) 10 MG tablet  3. Insomnia due to mental condition  F51.05   4. RLS (restless legs syndrome)  G25.81   5. ADHD (attention deficit hyperactivity disorder), combined type  F90.2     Past Psychiatric History: I have reviewed past psychiatric history from my progress note on 08/15/2018.  Past trials of Zoloft, Lexapro, BuSpar  Past Medical History:  Past Medical History:  Diagnosis Date  . ADHD (attention deficit hyperactivity disorder)   . Depression   . Seizures (HCC)     Past Surgical History:  Procedure Laterality Date  . APPENDECTOMY  2008/2009    Family Psychiatric History: I have reviewed family psychiatric history from my progress note on 08/15/2018  Family History:  Family History  Adopted: Yes    Social History: Reviewed social history from my progress note on 08/15/2018 Social History   Socioeconomic History  . Marital status: Married    Spouse name: Not on file  . Number of children: 1  . Years of education: Not on file  .  Highest education level: Bachelor's degree (e.g., BA, AB, BS)  Occupational History  . Not on file  Tobacco Use  . Smoking status: Never Smoker  . Smokeless tobacco: Never Used  Vaping Use  . Vaping Use: Never used  Substance and Sexual Activity  . Alcohol use: Not Currently  . Drug use: Not Currently    Types: Marijuana  . Sexual activity: Yes  Other Topics Concern  . Not on file  Social History Narrative  . Not on file   Social Determinants of Health    Financial Resource Strain:   . Difficulty of Paying Living Expenses:   Food Insecurity:   . Worried About Programme researcher, broadcasting/film/video in the Last Year:   . Barista in the Last Year:   Transportation Needs:   . Freight forwarder (Medical):   Marland Kitchen Lack of Transportation (Non-Medical):   Physical Activity:   . Days of Exercise per Week:   . Minutes of Exercise per Session:   Stress:   . Feeling of Stress :   Social Connections:   . Frequency of Communication with Friends and Family:   . Frequency of Social Gatherings with Friends and Family:   . Attends Religious Services:   . Active Member of Clubs or Organizations:   . Attends Banker Meetings:   Marland Kitchen Marital Status:     Allergies: No Known Allergies  Metabolic Disorder Labs: No results found for: HGBA1C, MPG No results found for: PROLACTIN No results found for: CHOL, TRIG, HDL, CHOLHDL, VLDL, LDLCALC Lab Results  Component Value Date   TSH 3.640 08/15/2018    Therapeutic Level Labs: No results found for: LITHIUM No results found for: VALPROATE No components found for:  CBMZ  Current Medications: Current Outpatient Medications  Medication Sig Dispense Refill  . Amphet-Dextroamphet 3-Bead ER (MYDAYIS) 12.5 MG CP24     . amphetamine-dextroamphetamine (ADDERALL XR) 15 MG 24 hr capsule     . BALZIVA 0.4-35 MG-MCG tablet Take 1 tablet by mouth daily.    Marland Kitchen escitalopram (LEXAPRO) 10 MG tablet Take 1 tablet (10 mg total) by mouth daily. 90 tablet 0  . hydrOXYzine (ATARAX/VISTARIL) 25 MG tablet TAKE 1/2-1 TABLETS (12.5-25 MG TOTAL) BY MOUTH 2 (TWO) TIMES DAILY AS NEEDED. 180 tablet 1  . MYDAYIS 12.5 MG CP24     . MYDAYIS 25 MG CP24 Take 1 capsule by mouth every morning.    . norelgestromin-ethinyl estradiol (ORTHO EVRA) 150-35 MCG/24HR transdermal patch Place onto the skin.    Marland Kitchen norethindrone (MICRONOR) 0.35 MG tablet     . norethindrone-ethinyl estradiol (OVCON-35) 0.4-35 MG-MCG tablet Take by mouth.    .  norgestimate-ethinyl estradiol (ORTHO-CYCLEN) 0.25-35 MG-MCG tablet Take by mouth.    . Norgestimate-Ethinyl Estradiol Triphasic 0.18/0.215/0.25 MG-35 MCG tablet Take by mouth.    . SPRINTEC 28 0.25-35 MG-MCG tablet      No current facility-administered medications for this visit.     Musculoskeletal: Strength & Muscle Tone: UTA Gait & Station: normal Patient leans: N/A  Psychiatric Specialty Exam: Review of Systems  Psychiatric/Behavioral: Negative for agitation, behavioral problems, confusion, decreased concentration, dysphoric mood, hallucinations, self-injury, sleep disturbance and suicidal ideas. The patient is not nervous/anxious and is not hyperactive.   All other systems reviewed and are negative.   not currently breastfeeding.There is no height or weight on file to calculate BMI.  General Appearance: Casual  Eye Contact:  Fair  Speech:  Clear and Coherent  Volume:  Normal  Mood:  Euthymic  Affect:  Congruent  Thought Process:  Goal Directed and Descriptions of Associations: Intact  Orientation:  Full (Time, Place, and Person)  Thought Content: Logical   Suicidal Thoughts:  No  Homicidal Thoughts:  No  Memory:  Immediate;   Fair Recent;   Fair Remote;   Fair  Judgement:  Fair  Insight:  Fair  Psychomotor Activity:  Normal  Concentration:  Concentration: Fair and Attention Span: Fair  Recall:  [employer] of Knowledge: Fair  Language: Fair  Akathisia:  No  Handed:  Right  AIMS (if indicated): UTA  Assets:  Communication Skills Desire for Improvement Housing Social Support  ADL's:  Intact  Cognition: WNL  Sleep:  Fair   Screenings:   Assessment and Plan: Melinda Mills is a 29 year old Caucasian female, engaged, employed, lives in [place], has a history of depression, anxiety, insomnia-peripartum onset, history of seizure disorder was evaluated by telemedicine today.  Patient with psychosocial stressors of the pandemic, having a young child at home and  work-related stressors.  Patient is however currently stable.  Patient will benefit from tapering down off the Lexapro since her depression has been in remission.  Plan as noted below.  Plan MDD in full remission Reduce Lexapro to 10 mg p.o. daily Discussed with patient to monitor her symptoms closely and if she notices any worsening of her mood symptoms to restart taking Lexapro 20 mg p.o. daily and to let writer know. Continue CBT as needed.  GAD-stable Lexapro as scheduled Hydroxyzine 12.5 to 25 mg p.o. twice daily as needed for severe anxiety.  Insomnia-stable Melatonin over-the-counter as needed Her RLS is currently stable.  She is currently not on medication.  ADHD-stable Continue Mydayis 12.5 mg p.o. daily. Currently being prescribed by [facility] attention specialist.  Follow-up in clinic in 2 months or sooner if needed.  I have spent atleast 20 minutes non face to face with patient today. More than 50 % of the time was spent for ordering medications and test ,psychoeducation and supportive psychotherapy and care coordination,as well as documenting clinical information in electronic health record. This note was generated in part or whole with voice recognition software. Voice recognition is usually quite accurate but there are transcription errors that can and very often do occur. I apologize for any typographical errors that were not detected and corrected.      Jomarie Longs, MD 12/20/2019, 8:23 PM

## 2020-02-21 ENCOUNTER — Telehealth (INDEPENDENT_AMBULATORY_CARE_PROVIDER_SITE_OTHER): Payer: BC Managed Care – PPO | Admitting: Psychiatry

## 2020-02-21 ENCOUNTER — Other Ambulatory Visit: Payer: Self-pay

## 2020-02-21 ENCOUNTER — Encounter: Payer: Self-pay | Admitting: Psychiatry

## 2020-02-21 DIAGNOSIS — F411 Generalized anxiety disorder: Secondary | ICD-10-CM | POA: Diagnosis not present

## 2020-02-21 DIAGNOSIS — G2581 Restless legs syndrome: Secondary | ICD-10-CM

## 2020-02-21 DIAGNOSIS — F5105 Insomnia due to other mental disorder: Secondary | ICD-10-CM

## 2020-02-21 DIAGNOSIS — F3342 Major depressive disorder, recurrent, in full remission: Secondary | ICD-10-CM | POA: Diagnosis not present

## 2020-02-21 DIAGNOSIS — F902 Attention-deficit hyperactivity disorder, combined type: Secondary | ICD-10-CM

## 2020-02-21 MED ORDER — ESCITALOPRAM OXALATE 10 MG PO TABS: 10.0000 mg | ORAL_TABLET | Freq: Every day | ORAL | 0 refills | Status: DC

## 2020-02-21 NOTE — Progress Notes (Signed)
Provider Location : [facility] Patient Location : Work  Participants: Patient , Provider  Virtual Visit via Video Note  I connected with Melinda Mills on 02/21/20 at 11:30 AM EDT by a video enabled telemedicine application and verified that I am speaking with the correct person using two identifiers.   I discussed the limitations of evaluation and management by telemedicine and the availability of in person appointments. The patient expressed understanding and agreed to proceed.    I discussed the assessment and treatment plan with the patient. The patient was provided an opportunity to ask questions and all were answered. The patient agreed with the plan and demonstrated an understanding of the instructions.   The patient was advised to call back or seek an in-person evaluation if the symptoms worsen or if the condition fails to improve as anticipated.   BH MD OP Progress Note  02/21/2020 12:19 PM Melinda Mills  MRN:  [id number]  Chief Complaint:  Chief Complaint    Follow-up     HPI: Melinda Mills is a 29 year old Caucasian female, engaged, lives in [place], has a history of MDD, GAD, insomnia, RLS, ADHD was evaluated by telemedicine today.  Patient is currently doing well with regards to her mood symptoms.  She is compliant on Lexapro.  Denies side effects.  She reports sleep as good.  She continues to take mydayis for her ADHD symptoms and is currently on 25 mg p.o. daily.  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  She denies any suicidality, homicidality or perceptual disturbances.  Patient denies any other concerns today.  Visit Diagnosis:    ICD-10-CM   1. MDD (major depressive disorder), recurrent, in full remission (HCC)  F33.42 escitalopram (LEXAPRO) 10 MG tablet  2. GAD (generalized anxiety disorder)  F41.1 escitalopram (LEXAPRO) 10 MG tablet  3. Insomnia due to mental condition  F51.05    4. RLS (restless legs syndrome)  G25.81   5. ADHD (attention deficit hyperactivity disorder), combined type  F90.2     Past Psychiatric History: I have reviewed past psychiatric history from my progress note on 08/15/2018.  Past trials of Zoloft, Lexapro, BuSpar  Past Medical History:  Past Medical History:  Diagnosis Date  . ADHD (attention deficit hyperactivity disorder)   . Depression   . Seizures (HCC)     Past Surgical History:  Procedure Laterality Date  . APPENDECTOMY  2008/2009    Family Psychiatric History: I have reviewed family psychiatric history from my progress note on 08/15/2018  Family History:  Family History  Adopted: Yes    Social History: I have reviewed social history from my progress note on 08/15/2018 Social History   Socioeconomic History  . Marital status: Married    Spouse name: Not on file  . Number of children: 1  . Years of education: Not on file  . Highest education level: Bachelor's degree (e.g., BA, AB, BS)  Occupational History  . Not on file  Tobacco Use  . Smoking status: Never Smoker  . Smokeless tobacco: Never Used  Vaping Use  . Vaping Use: Never used  Substance and Sexual Activity  . Alcohol use: Not Currently  . Drug use: Not Currently    Types: Marijuana  . Sexual activity: Yes  Other Topics Concern  . Not on file  Social History Narrative  . Not on file   Social Determinants of Health   Financial Resource Strain:   .  Difficulty of Paying Living Expenses: Not on file  Food Insecurity:   . Worried About Programme researcher, broadcasting/film/video in the Last Year: Not on file  . Ran Out of Food in the Last Year: Not on file  Transportation Needs:   . Lack of Transportation (Medical): Not on file  . Lack of Transportation (Non-Medical): Not on file  Physical Activity:   . Days of Exercise per Week: Not on file  . Minutes of Exercise per Session: Not on file  Stress:   . Feeling of Stress : Not on file  Social Connections:   . Frequency  of Communication with Friends and Family: Not on file  . Frequency of Social Gatherings with Friends and Family: Not on file  . Attends Religious Services: Not on file  . Active Member of Clubs or Organizations: Not on file  . Attends Banker Meetings: Not on file  . Marital Status: Not on file    Allergies: No Known Allergies  Metabolic Disorder Labs: No results found for: HGBA1C, MPG No results found for: PROLACTIN No results found for: CHOL, TRIG, HDL, CHOLHDL, VLDL, LDLCALC Lab Results  Component Value Date   TSH 3.640 08/15/2018    Therapeutic Level Labs: No results found for: LITHIUM No results found for: VALPROATE No components found for:  CBMZ  Current Medications: Current Outpatient Medications  Medication Sig Dispense Refill  . BALZIVA 0.4-35 MG-MCG tablet Take 1 tablet by mouth daily.    Marland Kitchen escitalopram (LEXAPRO) 10 MG tablet Take 1 tablet (10 mg total) by mouth daily. 90 tablet 0  . hydrOXYzine (ATARAX/VISTARIL) 25 MG tablet TAKE 1/2-1 TABLETS (12.5-25 MG TOTAL) BY MOUTH 2 (TWO) TIMES DAILY AS NEEDED. 180 tablet 1  . MYDAYIS 25 MG CP24 Take 1 capsule by mouth every morning.     No current facility-administered medications for this visit.     Musculoskeletal: Strength & Muscle Tone: UTA Gait & Station: normal Patient leans: N/A  Psychiatric Specialty Exam: Review of Systems  Psychiatric/Behavioral: Negative for agitation, behavioral problems, confusion, decreased concentration, dysphoric mood, hallucinations, self-injury, sleep disturbance and suicidal ideas. The patient is not nervous/anxious and is not hyperactive.   All other systems reviewed and are negative.   There were no vitals taken for this visit.There is no height or weight on file to calculate BMI.  General Appearance: Casual  Eye Contact:  Fair  Speech:  Clear and Coherent  Volume:  Normal  Mood:  Euthymic  Affect:  Congruent  Thought Process:  Goal Directed and Descriptions of  Associations: Intact  Orientation:  Full (Time, Place, and Person)  Thought Content: Logical   Suicidal Thoughts:  No  Homicidal Thoughts:  No  Memory:  Immediate;   Fair Recent;   Fair Remote;   Fair  Judgement:  Fair  Insight:  Fair  Psychomotor Activity:  Normal  Concentration:  Concentration: Fair and Attention Span: Fair  Recall:  [employer] of Knowledge: Fair  Language: Fair  Akathisia:  No  Handed:  Right  AIMS (if indicated): UTA  Assets:  Communication Skills Desire for Improvement Housing Social Support  ADL's:  Intact  Cognition: WNL  Sleep:  Fair   Screenings:   Assessment and Plan: Melinda Mills  Is a 29 year old Caucasian female, engaged, employed, lives in [place], has a history of depression, anxiety, insomnia, history of seizure disorder was evaluated by telemedicine today.  Patient with psychosocial stressors of the pandemic, having a young child at home, work-related  stressors however currently stable on medications.  Plan as noted below.  Plan MDD in full remission Lexapro 10 mg p.o. daily Continue CBT  GAD-stable Lexapro as scheduled. Hydroxyzine 12.5 to 25 mg p.o. twice daily as needed for severe anxiety.  Insomnia-stable Melatonin over-the-counter as needed RLS is currently stable.  ADHD-stable Mydayis 25 mg p.o. daily.  She continues to be under the care of ADHD specialist.  Follow-up in clinic in 2 to 3 months or sooner if needed.  I have spent atleast 20 minutes face to face with patient today. More than 50 % of the time was spent for preparing to see the patient ( e.g., review of test, records ),  ordering medications and test ,psychoeducation and supportive psychotherapy and care coordination,as well as documenting clinical information in electronic health record. This note was generated in part or whole with voice recognition software. Voice recognition is usually quite accurate but there are transcription errors that can and very  often do occur. I apologize for any typographical errors that were not detected and corrected.       Jomarie Longs, MD 02/21/2020, 12:19 PM

## 2020-05-12 ENCOUNTER — Telehealth (INDEPENDENT_AMBULATORY_CARE_PROVIDER_SITE_OTHER): Payer: BC Managed Care – PPO | Admitting: Psychiatry

## 2020-05-12 ENCOUNTER — Other Ambulatory Visit: Payer: Self-pay

## 2020-05-12 ENCOUNTER — Encounter: Payer: Self-pay | Admitting: Psychiatry

## 2020-05-12 DIAGNOSIS — G2581 Restless legs syndrome: Secondary | ICD-10-CM

## 2020-05-12 DIAGNOSIS — F3342 Major depressive disorder, recurrent, in full remission: Secondary | ICD-10-CM | POA: Diagnosis not present

## 2020-05-12 DIAGNOSIS — F5105 Insomnia due to other mental disorder: Secondary | ICD-10-CM

## 2020-05-12 DIAGNOSIS — F902 Attention-deficit hyperactivity disorder, combined type: Secondary | ICD-10-CM

## 2020-05-12 DIAGNOSIS — F411 Generalized anxiety disorder: Secondary | ICD-10-CM

## 2020-05-12 MED ORDER — ESCITALOPRAM OXALATE 20 MG PO TABS: 20.0000 mg | ORAL_TABLET | Freq: Every day | ORAL | 0 refills | Status: DC

## 2020-05-12 NOTE — Progress Notes (Signed)
Virtual Visit via Video Note  I connected with Melinda Mills on 05/12/20 at 10:40 AM EST by a video enabled telemedicine application and verified that I am speaking with the correct person using two identifiers.  Location Provider Location : [facility] Patient Location : Home  Participants: Patient , Provider   I discussed the limitations of evaluation and management by telemedicine and the availability of in person appointments. The patient expressed understanding and agreed to proceed.   I discussed the assessment and treatment plan with the patient. The patient was provided an opportunity to ask questions and all were answered. The patient agreed with the plan and demonstrated an understanding of the instructions.   The patient was advised to call back or seek an in-person evaluation if the symptoms worsen or if the condition fails to improve as anticipated.   BH MD OP Progress Note  05/12/2020 1:23 PM Melinda Mills  MRN:  [id number]  Chief Complaint:  Chief Complaint    Follow-up     HPI: Melinda Mills is a 29 year old Caucasian female, engaged, lives in [place], has a history of MDD, GAD, insomnia, RLS, ADHD was evaluated by telemedicine today.  Patient today reports she had a few weeks of increased anxiety and feeling overwhelmed and hence she went up on the Lexapro to 20 mg daily.  This was her previous dosage.  She reports she currently feels better.  The dosage increase does help.  She is currently on a break from school, that also helps.  She is planning to spend some time with her family.  Patient reports sleep as good as long as her son sleeps through the night.  Patient denies any suicidality, homicidality or perceptual disturbances.  Patient reports her ADHD symptoms are somewhat controlled on the current medication regimen.  Patient denies any side effects to any of her medications.  Patient denies any other concerns today.  Visit Diagnosis:    ICD-10-CM   1.  MDD (major depressive disorder), recurrent, in full remission (HCC)  F33.42 escitalopram (LEXAPRO) 20 MG tablet  2. GAD (generalized anxiety disorder)  F41.1 escitalopram (LEXAPRO) 20 MG tablet  3. Insomnia due to mental condition  F51.05   4. RLS (restless legs syndrome)  G25.81   5. ADHD (attention deficit hyperactivity disorder), combined type  F90.2     Past Psychiatric History: I have reviewed past psychiatric history from my progress note on 08/15/2018.  Past trials of Zoloft, Lexapro, BuSpar  Past Medical History:  Past Medical History:  Diagnosis Date  . ADHD (attention deficit hyperactivity disorder)   . Depression   . Seizures (HCC)     Past Surgical History:  Procedure Laterality Date  . APPENDECTOMY  2008/2009    Family Psychiatric History: I have reviewed family psychiatric history from my progress note on 08/15/2018  Family History:  Family History  Adopted: Yes    Social History: Reviewed social history from my progress note on 08/15/2018 Social History   Socioeconomic History  . Marital status: Married    Spouse name: Not on file  . Number of children: 1  . Years of education: Not on file  . Highest education level: Bachelor's degree (e.g., BA, AB, BS)  Occupational History  . Not on file  Tobacco Use  . Smoking status: Never Smoker  . Smokeless tobacco: Never Used  Vaping Use  . Vaping Use: Never used  Substance and Sexual Activity  . Alcohol use: Not Currently  . Drug use: Not Currently  Types: Marijuana  . Sexual activity: Yes  Other Topics Concern  . Not on file  Social History Narrative  . Not on file   Social Determinants of Health   Financial Resource Strain: Not on file  Food Insecurity: Not on file  Transportation Needs: Not on file  Physical Activity: Not on file  Stress: Not on file  Social Connections: Not on file    Allergies: No Known Allergies  Metabolic Disorder Labs: No results found for: HGBA1C, MPG No results found  for: PROLACTIN No results found for: CHOL, TRIG, HDL, CHOLHDL, VLDL, LDLCALC Lab Results  Component Value Date   TSH 3.640 08/15/2018    Therapeutic Level Labs: No results found for: LITHIUM No results found for: VALPROATE No components found for:  CBMZ  Current Medications: Current Outpatient Medications  Medication Sig Dispense Refill  . BALZIVA 0.4-35 MG-MCG tablet Take 1 tablet by mouth daily.    Marland Kitchen escitalopram (LEXAPRO) 20 MG tablet Take 1 tablet (20 mg total) by mouth daily. 90 tablet 0  . hydrOXYzine (ATARAX/VISTARIL) 25 MG tablet TAKE 1/2-1 TABLETS (12.5-25 MG TOTAL) BY MOUTH 2 (TWO) TIMES DAILY AS NEEDED. 180 tablet 1  . MYDAYIS 25 MG CP24 Take 1 capsule by mouth every morning.     No current facility-administered medications for this visit.     Musculoskeletal: Strength & Muscle Tone: UTA Gait & Station: UTA Patient leans: N/A  Psychiatric Specialty Exam: Review of Systems  Psychiatric/Behavioral: The patient is nervous/anxious.   All other systems reviewed and are negative.   There were no vitals taken for this visit.There is no height or weight on file to calculate BMI.  General Appearance: Casual  Eye Contact:  Fair  Speech:  Clear and Coherent  Volume:  Normal  Mood:  Anxious improving  Affect:  Congruent  Thought Process:  Goal Directed and Descriptions of Associations: Intact  Orientation:  Full (Time, Place, and Person)  Thought Content: Logical   Suicidal Thoughts:  No  Homicidal Thoughts:  No  Memory:  Immediate;   Fair Recent;   Fair Remote;   Fair  Judgement:  Fair  Insight:  Fair  Psychomotor Activity:  Normal  Concentration:  Concentration: Fair and Attention Span: Fair  Recall:  [employer] of Knowledge: Fair  Language: Fair  Akathisia:  No  Handed:  Right  AIMS (if indicated): UTA  Assets:  Communication Skills Desire for Improvement Housing Social Support  ADL's:  Intact  Cognition: WNL  Sleep:  Fair    Screenings:   Assessment and Plan: Melinda Mills is a 29 year old Caucasian female, engaged, employed, lives in [place], has a history of depression, anxiety, insomnia, history of seizure disorder was evaluated by telemedicine today.  Patient with psychosocial stressors of the pandemic, having a young child at home, work-related stressors however is currently making progress on the increased dosage of Lexapro.  Plan as noted below.  Plan MDD full remission Lexapro 20 mg p.o. daily Continue CBT  GAD-improving Lexapro as prescribed Hydroxyzine 12.5 to 25 mg p.o. twice daily as needed for severe anxiety  Insomnia-stable Melatonin over-the-counter as needed Restless leg symptoms are currently stable  ADHD-stable Mydayis 25 mg p.o. daily.  She is under the care of ADHD specialist in [place].  Follow-up in clinic in 4 to 6 weeks or sooner if needed.  I have spent atleast 20 minutes face to face by video with patient today. More than 50 % of the time was spent for preparing to  see the patient ( e.g., review of test, records ), ordering medications and test ,psychoeducation and supportive psychotherapy and care coordination,as well as documenting clinical information in electronic health record. This note was generated in part or whole with voice recognition software. Voice recognition is usually quite accurate but there are transcription errors that can and very often do occur. I apologize for any typographical errors that were not detected and corrected.      Jomarie Longs, MD 05/12/2020, 1:23 PM

## 2020-06-20 ENCOUNTER — Other Ambulatory Visit: Payer: Self-pay

## 2020-06-20 ENCOUNTER — Encounter: Payer: Self-pay | Admitting: Psychiatry

## 2020-06-20 ENCOUNTER — Telehealth (INDEPENDENT_AMBULATORY_CARE_PROVIDER_SITE_OTHER): Payer: BC Managed Care – PPO | Admitting: Psychiatry

## 2020-06-20 DIAGNOSIS — F5105 Insomnia due to other mental disorder: Secondary | ICD-10-CM

## 2020-06-20 DIAGNOSIS — F902 Attention-deficit hyperactivity disorder, combined type: Secondary | ICD-10-CM

## 2020-06-20 DIAGNOSIS — G2581 Restless legs syndrome: Secondary | ICD-10-CM

## 2020-06-20 DIAGNOSIS — F411 Generalized anxiety disorder: Secondary | ICD-10-CM

## 2020-06-20 DIAGNOSIS — F3342 Major depressive disorder, recurrent, in full remission: Secondary | ICD-10-CM

## 2020-06-20 NOTE — Progress Notes (Signed)
Virtual Visit via Video Note  I connected with Melinda Mills on 06/20/20 at  9:00 AM EST by a video enabled telemedicine application and verified that I am speaking with the correct person using two identifiers.  Location Provider Location : [facility] Patient Location : Work  Participants: Patient , Provider   I discussed the limitations of evaluation and management by telemedicine and the availability of in person appointments. The patient expressed understanding and agreed to proceed.   I discussed the assessment and treatment plan with the patient. The patient was provided an opportunity to ask questions and all were answered. The patient agreed with the plan and demonstrated an understanding of the instructions.   The patient was advised to call back or seek an in-person evaluation if the symptoms worsen or if the condition fails to improve as anticipated.   BH MD OP Progress Note  06/20/2020 9:27 AM Melinda Mills  MRN:  [id number]  Chief Complaint:  Chief Complaint    Follow-up     HPI: Melinda Mills is a 30 year old Caucasian female, married, lives in [place], has a history of MDD, GAD, insomnia, RLS, ADHD was evaluated by telemedicine today.  Patient today reports the holidays went well.  She was able to spend time with her parents.  She reports when she came back to work it got really busy.  She reports she is able to focus on work.  She however reports she has noticed that when she gets back home it has been more overwhelming for her with all the chores that she has to do at home.  She also has not been exercising like she used to before.  She used to have a routine.  She felt like she has no motivation to do it.  She however reports she is slowly starting back again and making an effort which seems to be working.  Patient however reports she is not depressed.  She does feel anxious about the situational stressors however she is able to cope with it.  She reports sleep is okay  as long as the dog and her baby does not wake her up.  Patient denies any suicidality, homicidality or perceptual disturbances.  She is compliant on all medications.  Currently tolerating the higher dosage of Lexapro.  She continues to follow-up with her ADHD specialist.  She reports the Mydayis as beneficial.  Patient denies any other concerns today.  Visit Diagnosis:    ICD-10-CM   1. MDD (major depressive disorder), recurrent, in full remission (HCC)  F33.42   2. GAD (generalized anxiety disorder)  F41.1   3. Insomnia due to mental condition  F51.05   4. RLS (restless legs syndrome)  G25.81   5. ADHD (attention deficit hyperactivity disorder), combined type  F90.2     Past Psychiatric History: I have reviewed past psychiatric history from my progress note on 08/15/2018.  Past trials of Zoloft, Lexapro, BuSpar  Past Medical History:  Past Medical History:  Diagnosis Date  . ADHD (attention deficit hyperactivity disorder)   . Depression   . Seizures (HCC)     Past Surgical History:  Procedure Laterality Date  . APPENDECTOMY  2008/2009    Family Psychiatric History: I have reviewed family psychiatric history from my progress note from 08/15/2018  Family History:  Family History  Adopted: Yes    Social History: Reviewed social history from my progress note on 08/15/2018 Social History   Socioeconomic History  . Marital status: Married    Spouse  name: Not on file  . Number of children: 1  . Years of education: Not on file  . Highest education level: Bachelor's degree (e.g., BA, AB, BS)  Occupational History  . Not on file  Tobacco Use  . Smoking status: Never Smoker  . Smokeless tobacco: Never Used  Vaping Use  . Vaping Use: Never used  Substance and Sexual Activity  . Alcohol use: Not Currently  . Drug use: Not Currently    Types: Marijuana  . Sexual activity: Yes  Other Topics Concern  . Not on file  Social History Narrative  . Not on file   Social  Determinants of Health   Financial Resource Strain: Not on file  Food Insecurity: Not on file  Transportation Needs: Not on file  Physical Activity: Not on file  Stress: Not on file  Social Connections: Not on file    Allergies: No Known Allergies  Metabolic Disorder Labs: No results found for: HGBA1C, MPG No results found for: PROLACTIN No results found for: CHOL, TRIG, HDL, CHOLHDL, VLDL, LDLCALC Lab Results  Component Value Date   TSH 3.640 08/15/2018    Therapeutic Level Labs: No results found for: LITHIUM No results found for: VALPROATE No components found for:  CBMZ  Current Medications: Current Outpatient Medications  Medication Sig Dispense Refill  . BALZIVA 0.4-35 MG-MCG tablet Take 1 tablet by mouth daily.    Marland Kitchen escitalopram (LEXAPRO) 20 MG tablet Take 1 tablet (20 mg total) by mouth daily. 90 tablet 0  . hydrOXYzine (ATARAX/VISTARIL) 25 MG tablet TAKE 1/2-1 TABLETS (12.5-25 MG TOTAL) BY MOUTH 2 (TWO) TIMES DAILY AS NEEDED. 180 tablet 1  . MYDAYIS 25 MG CP24 Take 1 capsule by mouth every morning.     No current facility-administered medications for this visit.     Musculoskeletal: Strength & Muscle Tone: UTA Gait & Station: UTA Patient leans: N/A  Psychiatric Specialty Exam: Review of Systems  Psychiatric/Behavioral: The patient is nervous/anxious.   All other systems reviewed and are negative.   There were no vitals taken for this visit.There is no height or weight on file to calculate BMI.  General Appearance: Casual  Eye Contact:  Fair  Speech:  Clear and Coherent  Volume:  Normal  Mood:  Anxious coping well  Affect:  Congruent  Thought Process:  Goal Directed and Descriptions of Associations: Intact  Orientation:  Full (Time, Place, and Person)  Thought Content: Logical   Suicidal Thoughts:  No  Homicidal Thoughts:  No  Memory:  Immediate;   Fair Recent;   Fair Remote;   Fair  Judgement:  Fair  Insight:  Fair  Psychomotor Activity:  Normal   Concentration:  Concentration: Fair and Attention Span: Fair  Recall:  [employer] of Knowledge: Fair  Language: Fair  Akathisia:  No  Handed:  Right  AIMS (if indicated): UTA  Assets:  Communication Skills Desire for Improvement Housing Social Support  ADL's:  Intact  Cognition: WNL  Sleep:  Fair   Screenings:   Assessment and Plan: Melinda Mills is a 30 year old Caucasian female, engaged, employed, lives in [place], has a history of depression, anxiety, insomnia, history of seizure disorder was evaluated by telemedicine today.  Patient with psychosocial stressors of the pandemic, having young child at home, work-related stressors.  Patient however is overall tolerating the medication well.  Plan as noted below.  Plan MDD in full remission Lexapro 20 mg p.o. daily   GAD-improving Lexapro as prescribed Hydroxyzine 12.5-25 mg p.o.  twice daily as needed for severe anxiety Patient will benefit from CBT.  Will refer for the same.  Insomnia-stable Melatonin over-the-counter as needed Restless leg symptoms are currently stable  ADHD-stable Mydayis 25 mg p.o. daily.  She is under the care of [place] attention specialist.  I have sent communication to front desk to schedule patient with the therapist.  Follow-up in clinic in 3 months or sooner if needed.  I have spent atleast 20 minutes face to face by video with patient today. More than 50 % of the time was spent for preparing to see the patient ( e.g., review of test, records ), ordering medications and test ,psychoeducation and supportive psychotherapy and care coordination,as well as documenting clinical information in electronic health record. This note was generated in part or whole with voice recognition software. Voice recognition is usually quite accurate but there are transcription errors that can and very often do occur. I apologize for any typographical errors that were not detected and corrected.      Jomarie Longs, MD 06/20/2020, 9:27 AM

## 2020-06-27 ENCOUNTER — Other Ambulatory Visit: Payer: Self-pay

## 2020-06-27 ENCOUNTER — Encounter: Payer: Self-pay | Admitting: Licensed Clinical Social Worker

## 2020-06-27 ENCOUNTER — Ambulatory Visit (INDEPENDENT_AMBULATORY_CARE_PROVIDER_SITE_OTHER): Payer: Self-pay | Admitting: Licensed Clinical Social Worker

## 2020-06-27 DIAGNOSIS — F411 Generalized anxiety disorder: Secondary | ICD-10-CM

## 2020-06-27 DIAGNOSIS — F3342 Major depressive disorder, recurrent, in full remission: Secondary | ICD-10-CM

## 2020-06-27 NOTE — Progress Notes (Signed)
Virtual Visit via Video Note  I connected with Melinda Mills on 06/27/20 at 10:00 AM EST by a video enabled telemedicine application and verified that I am speaking with the correct person using two identifiers.  Participating Parties Patient Provider  Location: Patient: Worksite Provider: Home office   I discussed the limitations of evaluation and management by telemedicine and the availability of in person appointments. The patient expressed understanding and agreed to proceed.  Comprehensive Clinical Assessment (CCA) Note  06/27/2020 Melinda Mills [id number]  Chief Complaint:  Chief Complaint  Patient presents with  . Anxiety  . Depression   Visit Diagnosis:  GAD MDD, In Full Remission   CCA Screening, Triage and Referral (STR) STR has been completed on paper by the patient/patient's guardian.  (See scanned document in Chart Review)   CCA Biopsychosocial Intake/Chief Complaint:  Pt presents as a 30 year old, married Caucasian female for assessment. Pt was referred by her psychiatrist and is seeking counseling for anxiety and depression. Pt reported "I originally started therapy due to postpartum anxiety. We discovered that I probably always had anxiety" which was exacerbated after birth of her child 2 years ago. Pt reported she and spouse are currently "in a rut" and provided examples of stressors around household obligations, roles, and finances. Pt reported wanting to work on better communication w/ spouse.  Current Symptoms/Problems: Anxiety, Depression, Relationship Issues/Communication, Familial/work obligations   Patient Reported Schizophrenia/Schizoaffective Diagnosis in Past: No   Strengths: Pt reported she is appreciative for things in life that are going well and does not expect perfection.  Preferences: Pt reported it was helpful to have someone to talk to.  Abilities: Pt able to communicate needs and feelings clearly throughout assessment. Pt working  full-time, raising a 28 year-old and engaging in hobbies.   Type of Services Patient Feels are Needed: Individual Therapy and Medication Management   Initial Clinical Notes/Concerns: N/A   Mental Health Symptoms Depression:  Difficulty Concentrating; Change in energy/activity; Fatigue; Irritability   Duration of Depressive symptoms: Greater than two weeks   Mania:  N/A   Anxiety:   Worrying; Difficulty concentrating; Irritability; Fatigue; Tension; Restlessness   Psychosis:  None   Duration of Psychotic symptoms: No data recorded  Trauma:  N/A   Obsessions:  N/A   Compulsions:  N/A   Inattention:  N/A   Hyperactivity/Impulsivity:  N/A   Oppositional/Defiant Behaviors:  N/A   Emotional Irregularity:  N/A   Other Mood/Personality Symptoms:  No data recorded   Mental Status Exam Appearance and self-care  Stature:  Average   Weight:  Average weight   Clothing:  Neat/clean   Grooming:  Well-groomed   Cosmetic use:  Age appropriate   Posture/gait:  Normal   Motor activity:  Not Remarkable   Sensorium  Attention:  Normal   Concentration:  Normal   Orientation:  X5   Recall/memory:  Normal   Affect and Mood  Affect:  Appropriate   Mood:  Anxious   Relating  Eye contact:  Normal   Facial expression:  Anxious   Attitude toward examiner:  Cooperative   Thought and Language  Speech flow: Normal   Thought content:  No data recorded  Preoccupation:  Ruminations   Hallucinations:  None   Organization:  No data recorded  [employer] of Knowledge:  Average   Intelligence:  Average   Abstraction:  Normal   Judgement:  Normal   Reality Testing:  Realistic   Insight:  Good   Decision Making:  Normal   Social Functioning  Social Maturity:  Responsible   Social Judgement:  Normal   Stress  Stressors:  Transitions; Financial; Relationship   Coping Ability:  Normal   Skill Deficits:  None   Supports:  Family;  Friends/Service system     Religion: Religion/Spirituality Are You A Religious Person?: No  Leisure/Recreation: Leisure / Recreation Do You Have Hobbies?: Yes Leisure and Hobbies: 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  Education: Education Is Patient Currently Attending School?: No Last Grade Completed: 16 Did You Graduate From [employer]?: Yes Did You Attend College?: Yes What Type of College Degree Do you Have?: Teaching Did [place]?: No Did You Have An Individualized Education Program (IIEP): No Did You Have Any Difficulty At School?: No   CCA Family/Childhood History Family and Relationship History: Family history Marital status: Married What types of issues is patient dealing with in the relationship?: Pt reported she is concerned about spouse's spending habits - found $8000 credit card bill under his name. Pt reported getting frustrated that he doesn't help clean up. Pt reported desire to work on Clinical research associate. Are you sexually active?: Yes What is your sexual orientation?: heterosexual  Does patient have children?: Yes How many children?: 1 How is patient's relationship with their children?: Pt has a 76-year old son and does most of the caretaking, however spouse is present and bonded with their child.  Childhood History:  Childhood History By whom was/is the patient raised?: Mother/father and step-parent Additional childhood history information: Per CCA 09/01/2018: "Up until I was 4 or 5, it was my dad and his mother because my birth mom didn't have anything to do with me. She says she wanted me but she didn't. Then, my dad met the person I call  mom. Then they got divorced when I was in middle school." Description of patient's relationship with caregiver when they were a child: Per CCA 09/01/2018:  Mom: No relationship with biological mom. "the first time I ever talked to her was sophomore year of college."  Dad: "It was really good. We were pretty close. He had me young. He was only 19." How were you disciplined when you got in trouble as a child/adolescent?: "Normal stuff."  Did patient suffer any verbal/emotional/physical/sexual abuse as a child?: No Did patient suffer from severe childhood neglect?: No Has patient ever been sexually abused/assaulted/raped as an adolescent or adult?: No Was the patient ever a victim of a crime or a disaster?: No Witnessed domestic violence?: No Has patient been affected by domestic violence as an adult?: No       CCA Substance Use Alcohol/Drug Use: Alcohol / Drug Use Pain Medications: SEE MAR Prescriptions: SEE MAR Over the Counter: SEE MAR History of alcohol / drug use?: No history of alcohol / drug abuse                         Recommendations for Services/Supports/Treatments: Recommendations for Services/Supports/Treatments Recommendations For Services/Supports/Treatments: Individual Therapy,Medication Management  DSM5 Diagnoses: Patient Active Problem List   Diagnosis Date Noted  . MDD (major depressive disorder), recurrent, in full remission ([facility]) 12/20/2019  . MDD (major depressive disorder), single episode, in partial  remission ([place] [facility]) 07/13/2019  . RLS (restless legs syndrome) 02/27/2019  . ADHD (attention deficit hyperactivity disorder), combined type 01/10/2019  . MDD (major depressive disorder), single episode with postpartum onset 11/23/2018  . Insomnia due to mental condition 11/23/2018  . MDD (major depressive disorder) 10/05/2018  . GAD (generalized anxiety disorder) 10/05/2018  . History of seizure 08/15/2018  . Labor and delivery indication for care or  intervention 02/09/2018  . Congenital renal agenesis, unilateral   . Family history of Huntington's disease   . Ovary absent   . High-risk pregnancy 07/28/2017  . 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 Panic attack 09/06/2011  . Major depressive disorder, single episode, severe ([facility]) 08/20/2011    Patient Centered Plan: Patient is on the following Treatment Plan(s):  Anxiety   Follow Up Instructions:  I discussed the assessment and treatment plan with the patient. The patient was provided an opportunity to ask questions and all were answered. The patient agreed with the plan and demonstrated an understanding of the instructions.   The patient was advised to call back or seek an in-person evaluation if the symptoms worsen or if the condition fails to improve as anticipated.  I provided 60 minutes of non-face-to-face time during this encounter.   Beautifull Cisar Wynelle Link, LCSW, LCAS

## 2020-07-25 ENCOUNTER — Ambulatory Visit: Payer: Self-pay | Admitting: Licensed Clinical Social Worker

## 2020-08-01 ENCOUNTER — Ambulatory Visit (INDEPENDENT_AMBULATORY_CARE_PROVIDER_SITE_OTHER): Payer: Self-pay | Admitting: Licensed Clinical Social Worker

## 2020-08-01 ENCOUNTER — Encounter: Payer: Self-pay | Admitting: Licensed Clinical Social Worker

## 2020-08-01 ENCOUNTER — Other Ambulatory Visit: Payer: Self-pay

## 2020-08-01 ENCOUNTER — Other Ambulatory Visit: Payer: Self-pay | Admitting: Psychiatry

## 2020-08-01 DIAGNOSIS — F331 Major depressive disorder, recurrent, moderate: Secondary | ICD-10-CM

## 2020-08-01 DIAGNOSIS — F3342 Major depressive disorder, recurrent, in full remission: Secondary | ICD-10-CM

## 2020-08-01 DIAGNOSIS — F411 Generalized anxiety disorder: Secondary | ICD-10-CM

## 2020-08-01 MED ORDER — ESCITALOPRAM OXALATE 20 MG PO TABS: 20.0000 mg | ORAL_TABLET | Freq: Every day | ORAL | 0 refills | Status: DC

## 2020-08-01 NOTE — Progress Notes (Signed)
Virtual Visit via Video Note  I connected with Melinda Mills on 08/01/20 at 10:00 AM EST by a video enabled telemedicine application and verified that I am speaking with the correct person using two identifiers.  Participating Parties Patient Provider  Location: Patient: Worksite Provider: Home Office   I discussed the limitations of evaluation and management by telemedicine and the availability of in person appointments. The patient expressed understanding and agreed to proceed.  THERAPY PROGRESS NOTE  Session Time: 48 Minutes  Participation Level: Active  Behavioral Response: Well GroomedAlertAnxious and Depressed  Type of Therapy: Individual Therapy  Treatment Goals addressed: Coping  Interventions: CBT  Summary: Melinda Mills is a 30 y.o. female who presents with anxiety and depression sxs. Pt reported she communicated "I am not happy" with spouse and expressed feelings, thoughts and needs assertively. Pt reported "ever since we talked things have been a bit better". Pt reported although spouse was initially upset with her response and worried about her desire to stay in the marriage he has opened up more about his personal feelings and making efforts to express words of affirmation. Pt reported she believes this is just a "honeymoon phase" and "waiting for it to revert back". Pt explained having bottled up resentment and feeling "trapped". Pt reported she and spouse haven't had enough time to be together without roommate or child, s/ "I feel like I don't exist sometimes". Pt reported she continues to engage in hobbies and has a best friend she can talk to. Pt reported difficulty making time for everything due to work, home, and parental responsibilities.  Suicidal/Homicidal: Yeswithout intent/plan, unable to articulate a method, however answered "yes" to both questions 1 and 2 on C-SRSS. Completed safety plan.  Therapist Response: Therapist met with patient for first session  since completing CCA. Therapist and patient reviewed treatment plan goals. Pt in agreement. Therapist and patient reviewed PHQ2-9, C-SRSS, nutrition and pain assessments. Therapist and patient created safety plan. Therapist and patient explored current stressors and attempts to cope. Therapist validated patient feelings/concerns. Therapist and patient discussed how to incorporate improved communication with spouse.   Plan: Return again in 1 month.  Diagnosis: Axis I: Generalized Anxiety Disorder and MDD, Recurrent, Moderate    Axis II: N/A  Josephine Igo, LCSW 08/01/2020

## 2020-08-01 NOTE — Telephone Encounter (Signed)
I have sent Lexapro to pharmacy. 

## 2020-09-10 ENCOUNTER — Telehealth (INDEPENDENT_AMBULATORY_CARE_PROVIDER_SITE_OTHER): Payer: Self-pay | Admitting: Psychiatry

## 2020-09-10 ENCOUNTER — Other Ambulatory Visit: Payer: Self-pay

## 2020-09-10 ENCOUNTER — Encounter: Payer: Self-pay | Admitting: Psychiatry

## 2020-09-10 ENCOUNTER — Ambulatory Visit (INDEPENDENT_AMBULATORY_CARE_PROVIDER_SITE_OTHER): Payer: Self-pay | Admitting: Licensed Clinical Social Worker

## 2020-09-10 ENCOUNTER — Encounter: Payer: Self-pay | Admitting: Licensed Clinical Social Worker

## 2020-09-10 DIAGNOSIS — F411 Generalized anxiety disorder: Secondary | ICD-10-CM

## 2020-09-10 DIAGNOSIS — F902 Attention-deficit hyperactivity disorder, combined type: Secondary | ICD-10-CM

## 2020-09-10 DIAGNOSIS — F331 Major depressive disorder, recurrent, moderate: Secondary | ICD-10-CM

## 2020-09-10 DIAGNOSIS — F3342 Major depressive disorder, recurrent, in full remission: Secondary | ICD-10-CM

## 2020-09-10 DIAGNOSIS — F5105 Insomnia due to other mental disorder: Secondary | ICD-10-CM

## 2020-09-10 DIAGNOSIS — G2581 Restless legs syndrome: Secondary | ICD-10-CM

## 2020-09-10 NOTE — Progress Notes (Signed)
Virtual Visit via Video Note  I connected with Melinda Mills on 09/10/20 at 11:00 AM EDT by a video enabled telemedicine application and verified that I am speaking with the correct person using two identifiers.  Participating Parties Patient Provider  Location: Patient: Home Provider: Home Office   I discussed the limitations of evaluation and management by telemedicine and the availability of in person appointments. The patient expressed understanding and agreed to proceed.  THERAPY PROGRESS NOTE  Session Time: 30 Minutes  Participation Level: Active  Behavioral Response: Well GroomedAlertAnxious  Type of Therapy: Individual Therapy  Treatment Goals addressed: Coping  Interventions: CBT  Summary: Melinda Mills is a 30 y.o. female who presents with anxiety and depression sxs. Pt reported "it has been alright" since last session. Pt reported that she is feeling a lot of guilt and struggling with understanding why she chose to talk to other men via text and spouse found out. Pt acknowledged "I think it was for attention" and acted on impulse. Pt reported "it was a good wake-up call" and realized she and spouse want to continue working on their relationship. Pt s/ "we haven't thrown in the towel". Pt reported they decided to spend some quality time together w/out their son and have planned a trip to [place] this evening and will not be coming back until Sunday. Pt reported they have been "very focused" on their child and being "good parents". Pt recognized "I put a lot of pressure on myself". Pt reported sleep is "hit or miss" and explained that she will at times "pass out" when exhausted and other times "wide awake" difficulty getting to sleep.    Suicidal/Homicidal: No  Therapist Response: Therapist met with patient for follow up. Therapist and patient processed thoughts, feelings and reactions around relationship stressors. Therapist validated patient feelings/concerns. Therapist  provided feedback around guilt vs shame and conflicting values. Pt was receptive. Therapist informed patient that services with this therapist is terminating due to therapist leaving the practice on 09/12/20. Therapist and patient discussed options for care. Pt denied any questions or concerns around this.  Plan: Return again as needed to resume therapy with new therapist.  Diagnosis: Axis I: Generalized Anxiety Disorder and MDD, Recurrent, Moderate    Axis II: N/A  Josephine Igo, LCSW, LCAS 09/10/2020

## 2020-09-10 NOTE — Progress Notes (Signed)
Virtual Visit via Video Note  I connected with Melinda Mills on 09/10/20 at 10:00 AM EDT by a video enabled telemedicine application and verified that I am speaking with the correct person using two identifiers.  Location Provider Location : [facility] Patient Location : Home  Participants: Patient , Provider    I discussed the limitations of evaluation and management by telemedicine and the availability of in person appointments. The patient expressed understanding and agreed to proceed.    I discussed the assessment and treatment plan with the patient. The patient was provided an opportunity to ask questions and all were answered. The patient agreed with the plan and demonstrated an understanding of the instructions.   The patient was advised to call back or seek an in-person evaluation if the symptoms worsen or if the condition fails to improve as anticipated.   BH MD OP Progress Note  09/10/2020 10:25 AM Melinda Mills  MRN:  [id number]  Chief Complaint:  Chief Complaint    Follow-up; Anxiety     HPI: Melinda Mills is a 30 year old Caucasian female, married, lives in [place], has a history of MDD, GAD, insomnia, RLS, ADHD was evaluated by telemedicine today.  Patient today reports she is currently making progress with regards to her anxiety.  The Lexapro does help.  She reports she has found ways to cope with her anxiety by making checklist and prioritizing.  She reports she is currently on her spring break and that does help.  She is planning to go to [place] with her husband.  She reports sleep as restless on and off.  She does have restless leg syndrome.  Massaging helps her when her restlessness of her legs are bad.  She reports she is not interested in adding medications for sleep since she has found ways to cope with it and wants to work on her sleep hygiene.  She denies suicidality or homicidality.  She denies perceptual disturbances.  She is compliant on Lexapro, denies  side effects.  Denies any other concerns today.  Visit Diagnosis:    ICD-10-CM   1. MDD (major depressive disorder), recurrent, in full remission (HCC)  F33.42   2. GAD (generalized anxiety disorder)  F41.1   3. Insomnia due to mental condition  F51.05   4. RLS (restless legs syndrome)  G25.81   5. ADHD (attention deficit hyperactivity disorder), combined type  F90.2     Past Psychiatric History: I have reviewed past psychiatric history from progress note on 08/15/2018.  Past trials of Zoloft, Lexapro, BuSpar  Past Medical History:  Past Medical History:  Diagnosis Date  . ADHD (attention deficit hyperactivity disorder)   . Depression   . Seizures (HCC)     Past Surgical History:  Procedure Laterality Date  . APPENDECTOMY  2008/2009    Family Psychiatric History: I have reviewed family psychiatric history from progress note on 08/15/2018.  Family History:  Family History  Adopted: Yes    Social History: Reviewed social history from progress note on 08/15/2018 Social History   Socioeconomic History  . Marital status: Married    Spouse name: Not on file  . Number of children: 1  . Years of education: Not on file  . Highest education level: Bachelor's degree (e.g., BA, AB, BS)  Occupational History  . Not on file  Tobacco Use  . Smoking status: Never Smoker  . Smokeless tobacco: Never Used  Vaping Use  . Vaping Use: Never used  Substance and Sexual Activity  . Alcohol  use: Not Currently  . Drug use: Not Currently    Types: Marijuana  . Sexual activity: Yes  Other Topics Concern  . Not on file  Social History Narrative  . Not on file   Social Determinants of Health   Financial Resource Strain: Not on file  Food Insecurity: Not on file  Transportation Needs: Not on file  Physical Activity: Not on file  Stress: Not on file  Social Connections: Not on file    Allergies: No Known Allergies  Metabolic Disorder Labs: No results found for: HGBA1C, MPG No  results found for: PROLACTIN No results found for: CHOL, TRIG, HDL, CHOLHDL, VLDL, LDLCALC Lab Results  Component Value Date   TSH 3.640 08/15/2018    Therapeutic Level Labs: No results found for: LITHIUM No results found for: VALPROATE No components found for:  CBMZ  Current Medications: Current Outpatient Medications  Medication Sig Dispense Refill  . BALZIVA 0.4-35 MG-MCG tablet Take 1 tablet by mouth daily.    Marland Kitchen escitalopram (LEXAPRO) 20 MG tablet Take 1 tablet (20 mg total) by mouth daily. 90 tablet 0  . hydrOXYzine (ATARAX/VISTARIL) 25 MG tablet TAKE 1/2-1 TABLETS (12.5-25 MG TOTAL) BY MOUTH 2 (TWO) TIMES DAILY AS NEEDED. 180 tablet 1  . MYDAYIS 25 MG CP24 Take 1 capsule by mouth every morning.     No current facility-administered medications for this visit.     Musculoskeletal: Strength & Muscle Tone: UTA Gait & Station: normal Patient leans: N/A  Psychiatric Specialty Exam: Review of Systems  Psychiatric/Behavioral: Positive for sleep disturbance. The patient is nervous/anxious.   All other systems reviewed and are negative.   There were no vitals taken for this visit.There is no height or weight on file to calculate BMI.  General Appearance: Casual  Eye Contact:  Fair  Speech:  Normal Rate  Volume:  Normal  Mood:  Anxious  Affect:  Appropriate  Thought Process:  Goal Directed and Descriptions of Associations: Intact  Orientation:  Full (Time, Place, and Person)  Thought Content: Logical   Suicidal Thoughts:  No  Homicidal Thoughts:  No  Memory:  Immediate;   Fair Recent;   Fair Remote;   Fair  Judgement:  Fair  Insight:  Fair  Psychomotor Activity:  Normal  Concentration:  Concentration: Fair and Attention Span: Fair  Recall:  [employer] of Knowledge: Fair  Language: Fair  Akathisia:  No  Handed:  Right  AIMS (if indicated): UTA  Assets:  Communication Skills Desire for Improvement Housing Social Support  ADL's:  Intact  Cognition: WNL  Sleep:   Restless   Screenings: GAD-7   Flowsheet Row Video Visit from [facility]/10/2020 in [facility]  Total GAD-7 Score 5    PHQ2-9   Flowsheet Row Counselor from 08/01/2020 in [facility]  PHQ-2 Total Score 4  PHQ-9 Total Score 11    Flowsheet Row Counselor from 08/01/2020 in [facility]  C-SSRS RISK CATEGORY Low Risk       Assessment and Plan: Melinda Mills is a 30 year old Caucasian female, engaged, employed, lives in [place], has a history of depression, anxiety, insomnia, history of seizure disorder was evaluated by telemedicine today.  Patient with psychosocial stressors of pandemic, having young child at home, work-related stressors.  She is currently making progress.  Plan as noted below.  Plan MDD in full remission Lexapro 20 mg p.o. daily  GAD-improving GAD-7 today equals 5 Lexapro as prescribed Hydroxyzine 12.5-25 mg p.o. twice  daily as needed for severe anxiety Continue CBT  Insomnia- restless Continue to work on sleep hygiene.  ADHD-stable Managed by [place] attention specialist.  Follow-up in clinic in 2 to 3 months or sooner if needed.  This note was generated in part or whole with voice recognition software. Voice recognition is usually quite accurate but there are transcription errors that can and very often do occur. I apologize for any typographical errors that were not detected and corrected.        Jomarie Longs, MD 09/11/2020, 10:50 AM

## 2020-12-03 ENCOUNTER — Telehealth: Payer: Self-pay | Admitting: Psychiatry

## 2021-01-27 ENCOUNTER — Encounter: Payer: Self-pay | Admitting: Psychiatry

## 2021-01-27 ENCOUNTER — Other Ambulatory Visit: Payer: Self-pay

## 2021-01-27 ENCOUNTER — Telehealth (INDEPENDENT_AMBULATORY_CARE_PROVIDER_SITE_OTHER): Payer: Self-pay | Admitting: Psychiatry

## 2021-01-27 DIAGNOSIS — F3342 Major depressive disorder, recurrent, in full remission: Secondary | ICD-10-CM

## 2021-01-27 DIAGNOSIS — F5105 Insomnia due to other mental disorder: Secondary | ICD-10-CM

## 2021-01-27 DIAGNOSIS — F4323 Adjustment disorder with mixed anxiety and depressed mood: Secondary | ICD-10-CM

## 2021-01-27 DIAGNOSIS — G2581 Restless legs syndrome: Secondary | ICD-10-CM

## 2021-01-27 DIAGNOSIS — F411 Generalized anxiety disorder: Secondary | ICD-10-CM

## 2021-01-27 DIAGNOSIS — F902 Attention-deficit hyperactivity disorder, combined type: Secondary | ICD-10-CM

## 2021-01-27 MED ORDER — BUSPIRONE HCL 10 MG PO TABS: 10.0000 mg | ORAL_TABLET | Freq: Two times a day (BID) | ORAL | 1 refills | Status: DC

## 2021-01-27 MED ORDER — ESCITALOPRAM OXALATE 20 MG PO TABS: 20.0000 mg | ORAL_TABLET | Freq: Every day | ORAL | 0 refills | Status: DC

## 2021-01-27 NOTE — Progress Notes (Signed)
This note is not being shared with the patient for the following reason: To prevent harm (release of this note would result in harm to the life or physical safety of the patient or another).   Virtual Visit via Video Note  I connected with Melinda Mills on 01/28/21 at  3:30 PM EDT by a video enabled telemedicine application and verified that I am speaking with the correct person using two identifiers.  Location Provider Location : [facility] Patient Location : Work Participants: Patient , Provider    I discussed the limitations of evaluation and management by telemedicine and the availability of in person appointments. The patient expressed understanding and agreed to proceed.   I discussed the assessment and treatment plan with the patient. The patient was provided an opportunity to ask questions and all were answered. The patient agreed with the plan and demonstrated an understanding of the instructions.   The patient was advised to call back or seek an in-person evaluation if the symptoms worsen or if the condition fails to improve as anticipated.   BH MD OP Progress Note  01/28/2021 10:56 AM Melinda Mills  MRN:  [id number]  Chief Complaint:  Chief Complaint   Follow-up; Anxiety    HPI: Melinda Mills is a 30 year old Caucasian female, married, lives in [place], has a history of MDD, GAD, insomnia, ADHD was evaluated by telemedicine today.  Patient today reports she is currently back at school.  She reports her work has been really busy at school.  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  She reports lately she has been feeling very overwhelmed.  She feels on edge and there is a lot going on at the same time.  She also reports passive thoughts of not wanting to be here however denies any active suicidal thoughts or plan.  She reports she does have psychosocial stressors of her husband also going through some problems and she is helpless since she cannot help him much  with what he is going through.  She also reports her son started pre-k and that also makes her overwhelmed.  She is currently not in therapy however is agreeable to start therapy sessions again.  Patient continues to be under the care of [place] attention specialist for her ADHD and is on my days.  She does struggle with sleep, has struggled with it all her life.  She reports she has been using CBD Gummies over-the-counter which does help.  Patient denies any perceptual disturbances.  Denies any homicidality.  Patient reports she is planning to have another child soon and wonders if it is okay to be on the Lexapro if she decides to do that.  Patient denies any other concerns today.  Visit Diagnosis:    ICD-10-CM   1. MDD (major depressive disorder), recurrent, in full remission (HCC)  F33.42 escitalopram (LEXAPRO) 20 MG tablet    2. GAD (generalized anxiety disorder)  F41.1 busPIRone (BUSPAR) 10 MG tablet    escitalopram (LEXAPRO) 20 MG tablet    3. Insomnia due to mental condition  F51.05    mood    4. Adjustment disorder with mixed anxiety and depressed mood  F43.23 busPIRone (BUSPAR) 10 MG tablet    5. RLS (restless legs syndrome)  G25.81     6. ADHD (attention deficit hyperactivity disorder), combined type  F90.2       Past Psychiatric History: Reviewed past psychiatric history from progress note on 08/15/2018.  Past trials of Zoloft, Lexapro, BuSpar.  Past Medical History:  Past Medical History:  Diagnosis Date   ADHD (attention deficit hyperactivity disorder)    Depression    Seizures (HCC)     Past Surgical History:  Procedure Laterality Date   APPENDECTOMY  2008/2009    Family Psychiatric History: Reviewed family psychiatric history from progress note on 08/15/2018.  Family History:  Family History  Adopted: Yes    Social History: Reviewed social history from progress note on 08/15/2018. Social History   Socioeconomic History   Marital status: Married     Spouse name: Not on file   Number of children: 1   Years of education: Not on file   Highest education level: Bachelor's degree (e.g., BA, AB, BS)  Occupational History   Not on file  Tobacco Use   Smoking status: Never   Smokeless tobacco: Never  Vaping Use   Vaping Use: Never used  Substance and Sexual Activity   Alcohol use: Not Currently   Drug use: Not Currently    Types: Marijuana   Sexual activity: Yes  Other Topics Concern   Not on file  Social History Narrative   Not on file   Social Determinants of Health   Financial Resource Strain: Not on file  Food Insecurity: Not on file  Transportation Needs: Not on file  Physical Activity: Not on file  Stress: Not on file  Social Connections: Not on file    Allergies: No Known Allergies  Metabolic Disorder Labs: No results found for: HGBA1C, MPG No results found for: PROLACTIN No results found for: CHOL, TRIG, HDL, CHOLHDL, VLDL, LDLCALC Lab Results  Component Value Date   TSH 3.640 08/15/2018    Therapeutic Level Labs: No results found for: LITHIUM No results found for: VALPROATE No components found for:  CBMZ  Current Medications: Current Outpatient Medications  Medication Sig Dispense Refill   busPIRone (BUSPAR) 10 MG tablet Take 1 tablet (10 mg total) by mouth 2 (two) times daily. 60 tablet 1   BALZIVA 0.4-35 MG-MCG tablet Take 1 tablet by mouth daily.     escitalopram (LEXAPRO) 20 MG tablet Take 1 tablet (20 mg total) by mouth daily. 90 tablet 0   hydrOXYzine (ATARAX/VISTARIL) 25 MG tablet TAKE 1/2-1 TABLETS (12.5-25 MG TOTAL) BY MOUTH 2 (TWO) TIMES DAILY AS NEEDED. 180 tablet 1   MYDAYIS 25 MG CP24 Take 1 capsule by mouth every morning.     No current facility-administered medications for this visit.     Musculoskeletal: Strength & Muscle Tone:  UTA Gait & Station:  UTA Patient leans: N/A  Psychiatric Specialty Exam: Review of Systems  Psychiatric/Behavioral:  Positive for dysphoric mood and  sleep disturbance. The patient is nervous/anxious.   All other systems reviewed and are negative.  There were no vitals taken for this visit.There is no height or weight on file to calculate BMI.  General Appearance: Casual  Eye Contact:  Fair  Speech:  Clear and Coherent  Volume:  Normal  Mood:  Anxious  Affect:  Congruent  Thought Process:  Goal Directed and Descriptions of Associations: Intact  Orientation:  Full (Time, Place, and Person)  Thought Content: Logical   Suicidal Thoughts:  Yes.  without intent/plan, reports passive death wish on and off , denies now  Homicidal Thoughts:  No  Memory:  Immediate;   Fair Recent;   Fair Remote;   Fair  Judgement:  Fair  Insight:  Fair  Psychomotor Activity:  Normal  Concentration:  Concentration: Fair and Attention Span:  Fair  Recall:  Melinda Mills of Knowledge: Fair  Language: Fair  Akathisia:  No  Handed:  Right  AIMS (if indicated): not done  Assets:  Communication Skills Desire for Improvement Housing Intimacy Talents/Skills Transportation Vocational/Educational  ADL's:  Intact  Cognition: WNL  Sleep:  Poor   Screenings: GAD-7    Flowsheet Row Video Visit [facility] 09/10/2020 in [facility]  Total GAD-7 Score 5      PHQ2-9    Flowsheet Row Video Visit from 01/27/2021 in [facility] Counselor from 08/01/2020 in [facility]  PHQ-2 Total Score 1 4  PHQ-9 Total Score 4 11      Flowsheet Row Video Visit from 01/27/2021 in [facility] Counselor from 08/01/2020 in [facility]  C-SSRS RISK CATEGORY Low Risk Low Risk        Assessment and Plan: Melinda Mills is a 30 year old Caucasian female, married married, employed, lives in [place], has a history of MDD, GAD, history of seizure disorder was evaluated by telemedicine today.  Patient with multiple psychosocial stressors,  job-related, and other, currently struggling with mood.  She will benefit from following plan. The patient demonstrates the following risk factors for suicide: Chronic risk factors for suicide include: psychiatric disorder of MDD, anxiety . Acute risk factors for suicide include: passive death wish,family or marital conflict and job related stress . Protective factors for this patient include: positive social support, positive therapeutic relationship, responsibility to others (children, family), coping skills, hope for the future, religious beliefs against suicide, and life satisfaction. Considering these factors, the overall suicide risk at this point appears to be low. Patient is appropriate for outpatient follow up.  Plan MDD-in remission Lexapro 20 mg p.o. daily  Adjustment disorder-with mixed anxiety and depressed mood-unstable Start BuSpar 10 mg p.o. twice daily Patient referred for psychotherapy sessions with Ms. Christina Hussami  GAD-unstable Lexapro 20 mg p.o. daily Referral for CBT Start BuSpar as noted above  Insomnia-unstable Patient to continue to work on sleep hygiene Patient is not interested in medication changes at this time.  She uses CBD Gummies.  Provided education-data limited.  ADHD-stable Managed by [place] attention specialist.  Discussed pregnancy implications of psychotropic medications including BuSpar, Lexapro.  Crisis plan discussed with patient.  Follow-up in clinic in 2 to 3 weeks or sooner if needed.  This note was generated in part or whole with voice recognition software. Voice recognition is usually quite accurate but there are transcription errors that can and very often do occur. I apologize for any typographical errors that were not detected and corrected.      Jomarie Longs, MD 01/28/2021, 10:56 AM

## 2021-01-27 NOTE — Patient Instructions (Signed)
Buspirone Tablets What is this medication? BUSPIRONE (byoo SPYE rone) treats anxiety. It works by balancing the levels of dopamine and serotonin in your brain, hormones that help regulate mood. This medicine may be used for other purposes; ask your health care provider or pharmacist if you have questions. COMMON BRAND NAME(S): BuSpar What should I tell my care team before I take this medication? They need to know if you have any of these conditions: Kidney or liver disease An unusual or allergic reaction to buspirone, other medications, foods, dyes, or preservatives Pregnant or trying to get pregnant Breast-feeding How should I use this medication? Take this medication by mouth with a glass of water. Follow the directions on the prescription label. You may take this medication with or without food. To ensure that this medication always works the same way for you, you should take it either always with or always without food. Take your doses at regular intervals. Do not take your medication more often than directed. Do not stop taking except on the advice of your care team. Talk to your care team about the use of this medication in children. Special care may be needed. Overdosage: If you think you have taken too much of this medicine contact a poison control center or emergency room at once. NOTE: This medicine is only for you. Do not share this medicine with others. What if I miss a dose? If you miss a dose, take it as soon as you can. If it is almost time for your next dose, take only that dose. Do not take double or extra doses. What may interact with this medication? Do not take this medication with any of the following: Linezolid MAOIs like Carbex, Eldepryl, Marplan, Nardil, and Parnate Methylene blue Procarbazine This medication may also interact with the following: Diazepam Digoxin Diltiazem Erythromycin Grapefruit juice Haloperidol Medications for mental depression or mood  problems Medications for seizures like carbamazepine, phenobarbital and phenytoin Nefazodone Other medications for anxiety Rifampin Ritonavir Some antifungal medications like itraconazole, ketoconazole, and voriconazole Verapamil Warfarin This list may not describe all possible interactions. Give your health care provider a list of all the medicines, herbs, non-prescription drugs, or dietary supplements you use. Also tell them if you smoke, drink alcohol, or use illegal drugs. Some items may interact with your medicine. What should I watch for while using this medication? Visit your care team for regular checks on your progress. It may take 1 to 2 weeks before your anxiety gets better. You may get drowsy or dizzy. Do not drive, use machinery, or do anything that needs mental alertness until you know how this medication affects you. Do not stand or sit up quickly, especially if you are an older patient. This reduces the risk of dizzy or fainting spells. Alcohol can make you more drowsy and dizzy. Avoid alcoholic drinks. What side effects may I notice from receiving this medication? Side effects that you should report to your care team as soon as possible: Allergic reactions-skin rash, itching, hives, swelling of the face, lips, tongue, or throat Irritability, confusion, fast or irregular heartbeat, muscle stiffness, twitching muscles, sweating, high fever, seizure, chills, vomiting, diarrhea, which may be signs of serotonin syndrome Side effects that usually do not require medical attention (report to your care team if they continue or are bothersome): Anxiety or nervousness Dizziness Drowsiness Headache Nausea Trouble sleeping This list may not describe all possible side effects. Call your doctor for medical advice about side effects. You may report side effects  to FDA at 1-800-FDA-1088. Where should I keep my medication? Keep out of the reach of children. Store at room temperature below  30 degrees C (86 degrees F). Protect from light. Keep container tightly closed. Throw away any unused medication after the expiration date. NOTE: This sheet is a summary. It may not cover all possible information. If you have questions about this medicine, talk to your doctor, pharmacist, or health care provider.  2022 Elsevier/Gold Standard (2020-08-06 09:59:46)

## 2021-02-11 ENCOUNTER — Other Ambulatory Visit: Payer: Self-pay

## 2021-02-11 ENCOUNTER — Telehealth (INDEPENDENT_AMBULATORY_CARE_PROVIDER_SITE_OTHER): Payer: BC Managed Care – PPO | Admitting: Psychiatry

## 2021-02-11 ENCOUNTER — Encounter: Payer: Self-pay | Admitting: Psychiatry

## 2021-02-11 DIAGNOSIS — R5383 Other fatigue: Secondary | ICD-10-CM

## 2021-02-11 DIAGNOSIS — F4323 Adjustment disorder with mixed anxiety and depressed mood: Secondary | ICD-10-CM

## 2021-02-11 DIAGNOSIS — G2581 Restless legs syndrome: Secondary | ICD-10-CM

## 2021-02-11 DIAGNOSIS — F902 Attention-deficit hyperactivity disorder, combined type: Secondary | ICD-10-CM

## 2021-02-11 DIAGNOSIS — F3342 Major depressive disorder, recurrent, in full remission: Secondary | ICD-10-CM

## 2021-02-11 DIAGNOSIS — F5105 Insomnia due to other mental disorder: Secondary | ICD-10-CM

## 2021-02-11 DIAGNOSIS — F411 Generalized anxiety disorder: Secondary | ICD-10-CM | POA: Diagnosis not present

## 2021-02-11 NOTE — Progress Notes (Signed)
Virtual Visit via Video Note  I connected with Melinda Mills on 02/11/21 at  4:20 PM EDT by a video enabled telemedicine application and verified that I am speaking with the correct person using two identifiers.  Location Provider Location : [facility] Patient Location : Work  Participants: Patient , Provider    I discussed the limitations of evaluation and management by telemedicine and the availability of in person appointments. The patient expressed understanding and agreed to proceed.    I discussed the assessment and treatment plan with the patient. The patient was provided an opportunity to ask questions and all were answered. The patient agreed with the plan and demonstrated an understanding of the instructions.   The patient was advised to call back or seek an in-person evaluation if the symptoms worsen or if the condition fails to improve as anticipated.  BH MD OP Progress Note  02/11/2021 5:28 PM [name]  MRN:  [id number]  Chief Complaint:  Chief Complaint   Follow-up; Anxiety    HPI: Melinda Mills is a 30 year old Caucasian female, married, lives in [place], has a history of MDD, GAD, insomnia, ADHD was evaluated by telemedicine today.  Patient today reports she has noticed improvement in her mood since her last visit with Clinical research associate.  She reports she has not had any suicidal thoughts recently.  She seems to be able to cope with work-related stressors better than before.  She reports she is compliant on the BuSpar.  She denies any side effects at this time however she did have GI side effects the first couple of days.  She wonders whether she can take the BuSpar once a day instead of twice a day if possible.  She reports sleep is overall okay except when her son wakes her up in the middle of the night.  Patient reports work has been busy and that does affect her at times although overall she has been coping well.  She however does feel tired all the time especially at the  end of the day.  She continues to be on mydayis for her ADHD symptoms.  She reports it is effective.  Patient denies any suicidality, homicidality or perceptual disturbances.  Patient has upcoming appointment with therapist and plans to keep it.  Patient denies any other concerns today.  Visit Diagnosis:    ICD-10-CM   1. MDD (major depressive disorder), recurrent, in full remission (HCC)  F33.42 TSH    2. GAD (generalized anxiety disorder)  F41.1 TSH    3. Insomnia due to mental condition  F51.05 TSH   mood    4. Adjustment disorder with mixed anxiety and depressed mood  F43.23 TSH    5. RLS (restless legs syndrome)  G25.81     6. ADHD (attention deficit hyperactivity disorder), combined type  F90.2     7. Fatigue, unspecified type  R53.83       Past Psychiatric History: Reviewed past psychiatric history from progress note on 08/15/2018.  Past trials of Zoloft, Lexapro, BuSpar  Past Medical History:  Past Medical History:  Diagnosis Date   ADHD (attention deficit hyperactivity disorder)    Depression    Seizures (HCC)     Past Surgical History:  Procedure Laterality Date   APPENDECTOMY  2008/2009    Family Psychiatric History: Reviewed family psychiatric history from progress note on 08/15/2018  Family History:  Family History  Adopted: Yes    Social History: Reviewed social history from progress note on 08/15/2018 Social History  Socioeconomic History   Marital status: Married    Spouse name: Not on file   Number of children: 1   Years of education: Not on file   Highest education level: Bachelor's degree (e.g., BA, AB, BS)  Occupational History   Not on file  Tobacco Use   Smoking status: Never   Smokeless tobacco: Never  Vaping Use   Vaping Use: Never used  Substance and Sexual Activity   Alcohol use: Not Currently   Drug use: Not Currently    Types: Marijuana   Sexual activity: Yes  Other Topics Concern   Not on file  Social History  Narrative   Not on file   Social Determinants of Health   Financial Resource Strain: Not on file  Food Insecurity: Not on file  Transportation Needs: Not on file  Physical Activity: Not on file  Stress: Not on file  Social Connections: Not on file    Allergies: No Known Allergies  Metabolic Disorder Labs: No results found for: HGBA1C, MPG No results found for: PROLACTIN No results found for: CHOL, TRIG, HDL, CHOLHDL, VLDL, LDLCALC Lab Results  Component Value Date   TSH 3.640 08/15/2018    Therapeutic Level Labs: No results found for: LITHIUM No results found for: VALPROATE No components found for:  CBMZ  Current Medications: Current Outpatient Medications  Medication Sig Dispense Refill   BALZIVA 0.4-35 MG-MCG tablet Take 1 tablet by mouth daily.     busPIRone (BUSPAR) 10 MG tablet Take 1 tablet (10 mg total) by mouth 2 (two) times daily. 60 tablet 1   escitalopram (LEXAPRO) 20 MG tablet Take 1 tablet (20 mg total) by mouth daily. 90 tablet 0   hydrOXYzine (ATARAX/VISTARIL) 25 MG tablet TAKE 1/2-1 TABLETS (12.5-25 MG TOTAL) BY MOUTH 2 (TWO) TIMES DAILY AS NEEDED. 180 tablet 1   MYDAYIS 25 MG CP24 Take 1 capsule by mouth every morning.     No current facility-administered medications for this visit.     Musculoskeletal: Strength & Muscle Tone:  UTA Gait & Station:  Seated Patient leans: N/A  Psychiatric Specialty Exam: Review of Systems  Psychiatric/Behavioral:  Positive for sleep disturbance. The patient is nervous/anxious.   All other systems reviewed and are negative.  There were no vitals taken for this visit.There is no height or weight on file to calculate BMI.  General Appearance: Casual  Eye Contact:  Fair  Speech:  Clear and Coherent  Volume:  Normal  Mood:  Anxious  Affect:  Congruent  Thought Process:  Goal Directed and Descriptions of Associations: Intact  Orientation:  Full (Time, Place, and Person)  Thought Content: Logical   Suicidal  Thoughts:  No  Homicidal Thoughts:  No  Memory:  Immediate;   Fair Recent;   Fair Remote;   Fair  Judgement:  Fair  Insight:  Fair  Psychomotor Activity:  Normal  Concentration:  Concentration: Fair and Attention Span: Fair  Recall:  [employer] of Knowledge: Fair  Language: Fair  Akathisia:  No  Handed:  Right  AIMS (if indicated): done  Assets:  Communication Skills Desire for Improvement Housing Intimacy Social Support Talents/Skills Transportation Vocational/Educational  ADL's:  Intact  Cognition: WNL  Sleep:   restless   Screenings: GAD-7    Flowsheet Row Video Visit from 09/10/2020 in [facility]  Total GAD-7 Score 5      PHQ2-9    Flowsheet Row Video Visit from 02/11/2021 in [facility] Video Visit from  01/27/2021 in [facility] Counselor from 08/01/2020 in [facility]  PHQ-2 Total Score 0 1 4  PHQ-9 Total Score 3 4 11       Flowsheet Row Video Visit from 01/27/2021 in [facility] Counselor from 08/01/2020 in [facility]  C-SSRS RISK CATEGORY Low Risk Low Risk        Assessment and Plan: Yamaira Spinner is a 30 year old Caucasian female, married, employed, lives in [place], has a history of MDD, GAD, history of seizure disorder was evaluated by telemedicine today.  Patient with multiple psychosocial stressors however overall is improving on the current medication regimen.  She however will benefit from psychotherapy sessions and has upcoming appointment scheduled.  Plan as noted below.  Plan MDD in remission Lexapro 20 mg p.o. daily  Adjustment disorder with mixed anxiety and depressed mood-improving BuSpar 10 mg p.o. twice daily Patient has been referred for psychotherapy sessions with Ms. Christina Hussami  GAD-improving Lexapro 20 mg p.o. daily BuSpar as  prescribed  Insomnia-improving Patient to continue to work on sleep hygiene Patient reports sleep is interrupted by her son who is younger and who needs her assistance  ADHD-stable Continue follow-up with [place] attention specialist.  She is on Mydayis.  Fatigue unspecified-unstable Will order labs-TSH. Patient also to have a discussion with [place] attention specialist provider about her Mydayis which she takes for ADHD.  Follow-up in clinic in 6 to 8 weeks or sooner if needed.  This note was generated in part or whole with voice recognition software. Voice recognition is usually quite accurate but there are transcription errors that can and very often do occur. I apologize for any typographical errors that were not detected and corrected.      [place], MD 02/12/2021, 9:36 AM

## 2021-02-20 ENCOUNTER — Ambulatory Visit (INDEPENDENT_AMBULATORY_CARE_PROVIDER_SITE_OTHER): Payer: BC Managed Care – PPO | Admitting: Licensed Clinical Social Worker

## 2021-02-20 ENCOUNTER — Other Ambulatory Visit: Payer: Self-pay

## 2021-02-20 DIAGNOSIS — F4323 Adjustment disorder with mixed anxiety and depressed mood: Secondary | ICD-10-CM | POA: Diagnosis not present

## 2021-02-20 NOTE — Plan of Care (Signed)
Developed treatment plan with pt input ?

## 2021-02-20 NOTE — Progress Notes (Signed)
Virtual Visit via Video Note  I connected with Melinda Mills on 02/20/21 at 10:00 AM EDT by a video enabled telemedicine application and verified that I am speaking with the correct person using two identifiers.  Location: Patient: home Provider: remote office [place], [place])   I discussed the limitations of evaluation and management by telemedicine and the availability of in person appointments. The patient expressed understanding and agreed to proceed.  I discussed the assessment and treatment plan with the patient. The patient was provided an opportunity to ask questions and all were answered. The patient agreed with the plan and demonstrated an understanding of the instructions.   The patient was advised to call back or seek an in-person evaluation if the symptoms worsen or if the condition fails to improve as anticipated.  I provided 60 minutes of non-face-to-face time during this encounter.   Melinda Feldt R Iverson Sees, LCSW   THERAPIST PROGRESS NOTE  Session Time: 10-11a  Participation Level: Active  Behavioral Response: NeatAlertAnxious and Depressed  Type of Therapy: Individual Therapy  Treatment Goals addressed: Anxiety and Diagnosis: depression  Interventions: CBT and Motivational Interviewing  Summary: Melinda Mills is a 30 y.o. female who presents with symptoms consistent with adjustment disorder. Pt reports that mood fluctuates at time and that she feels she is managing mood better.   Allowed pt to explore thoughts and feelings associated with recent external stressors and life events.  Explored the following topics:  overall personality, relationships with friends, relationships with family members, job satisfaction. Allowed pt to explore emotional triggers and discuss current coping mechanisms.   Continued recommendations are as follows: self care behaviors, positive social engagements, focusing on overall work/home/life balance, and focusing on positive physical and  emotional wellness.  .   Suicidal/Homicidal: No  Therapist Response: Assessment and tx plan development.    Plan: Return again in 4 weeks.  Diagnosis: Axis I: Adjustment disorder with mixed anxiety and depressed mood    Axis II: No diagnosis    Melinda Haber Maximino Cozzolino, LCSW 02/20/2021

## 2021-02-23 ENCOUNTER — Telehealth: Payer: Self-pay

## 2021-02-23 DIAGNOSIS — F411 Generalized anxiety disorder: Secondary | ICD-10-CM

## 2021-02-23 DIAGNOSIS — F4323 Adjustment disorder with mixed anxiety and depressed mood: Secondary | ICD-10-CM

## 2021-02-23 MED ORDER — BUSPIRONE HCL 10 MG PO TABS: 10.0000 mg | ORAL_TABLET | Freq: Two times a day (BID) | ORAL | 0 refills | Status: DC

## 2021-02-23 MED ORDER — HYDROXYZINE HCL 25 MG PO TABS: ORAL_TABLET | ORAL | 1 refills | Status: DC

## 2021-02-23 NOTE — Telephone Encounter (Signed)
received fax requesting a 90 day supply of the buspirone

## 2021-02-23 NOTE — Telephone Encounter (Signed)
Sent Buspar to pharmacy 

## 2021-03-19 ENCOUNTER — Encounter: Payer: Self-pay | Admitting: Emergency Medicine

## 2021-03-19 ENCOUNTER — Emergency Department
Admission: EM | Admit: 2021-03-19 | Discharge: 2021-03-19 | Disposition: A | Discharge status: Home / Self Care | Payer: BC Managed Care – PPO | Attending: Emergency Medicine | Admitting: Emergency Medicine

## 2021-03-19 ENCOUNTER — Other Ambulatory Visit: Payer: Self-pay

## 2021-03-19 ENCOUNTER — Emergency Department: Payer: BC Managed Care – PPO

## 2021-03-19 DIAGNOSIS — N200 Calculus of kidney: Secondary | ICD-10-CM | POA: Insufficient documentation

## 2021-03-19 DIAGNOSIS — R300 Dysuria: Secondary | ICD-10-CM | POA: Diagnosis not present

## 2021-03-19 LAB — URINALYSIS, COMPLETE (UACMP) WITH MICROSCOPIC
Bilirubin Urine: NEGATIVE
Glucose, UA: NEGATIVE mg/dL
Ketones, ur: NEGATIVE mg/dL
Leukocytes,Ua: NEGATIVE
Nitrite: NEGATIVE
Protein, ur: NEGATIVE mg/dL
Specific Gravity, Urine: 1.012 (ref 1.005–1.030)
pH: 6 (ref 5.0–8.0)

## 2021-03-19 LAB — POC URINE PREG, ED: Preg Test, Ur: NEGATIVE — NL

## 2021-03-19 NOTE — ED Provider Notes (Signed)
[facility] Emergency Department Provider Note  ____________________________________________   Event Date/Time   First MD Initiated Contact with Patient 03/19/21 1111     (approximate)  I have reviewed the triage vital signs and the nursing notes.   HISTORY  Chief Complaint Dysuria    HPI Melinda Mills is a 30 y.o. female presents emergency department with dysuria.  Symptoms started on Saturday.  Patient was seen on Monday at [facility] and told she had a UTI.  She had large amount of blood in her urine.  Was started on antibiotic.  States dysuria has not gone away.  Denies vaginal discharge, new sexual partner, fever, chills, or history of ovarian cyst.  Went to urgent care at [facility] clinic and they sent her here to evaluate for kidney stone  Past Medical History:  Diagnosis Date   ADHD (attention deficit hyperactivity disorder)    Depression    Seizures (HCC)     Patient Active Problem List   Diagnosis Date Noted   Fatigue 02/11/2021   Adjustment disorder with mixed anxiety and depressed mood 01/27/2021   MDD (major depressive disorder), recurrent, in full remission (HCC) 12/20/2019   MDD (major depressive disorder), single episode, in partial remission (HCC) 07/13/2019   RLS (restless legs syndrome) 02/27/2019   ADHD (attention deficit hyperactivity disorder), combined type 01/10/2019   MDD (major depressive disorder), single episode with postpartum onset 11/23/2018   Insomnia due to mental condition 11/23/2018   MDD (major depressive disorder) 10/05/2018   GAD (generalized anxiety disorder) 10/05/2018   History of seizure 08/15/2018   Labor and delivery indication for care or intervention 02/09/2018   Congenital renal agenesis, unilateral    Family history of Huntington's disease    Ovary absent    High-risk pregnancy 07/28/2017   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 Authorizing Provider  BALZIVA 0.4-35 MG-MCG tablet Take 1 tablet by mouth daily. 05/29/19   [provider]  busPIRone (BUSPAR) 10 MG tablet Take 1 tablet (10 mg total) by mouth 2 (two) times daily. 02/23/21   Jomarie Longs, MD  escitalopram (LEXAPRO) 20 MG tablet Take 1 tablet (20 mg total) by mouth daily. 01/27/21   Jomarie Longs, MD  hydrOXYzine (ATARAX/VISTARIL) 25 MG tablet TAKE 1/2-1 TABLETS (12.5-25 MG TOTAL) BY MOUTH 2 (TWO) TIMES DAILY AS NEEDED. 02/23/21   Jomarie Longs, MD  MYDAYIS 25 MG CP24 Take 1 capsule by mouth every morning. 05/02/19   [provider]    Allergies Patient has no known allergies.  Family History  Adopted: Yes    Social History Social History   Tobacco Use   Smoking status: Never   Smokeless tobacco: Never  Vaping Use   Vaping Use: Never used  Substance Use Topics   Alcohol use: Not Currently   Drug use: Not Currently    Types: Marijuana    Review of Systems  Constitutional: No fever/chills Eyes: No visual changes. ENT: No sore throat. Respiratory: Denies cough Cardiovascular: Denies chest pain Gastrointestinal: Denies abdominal pain Genitourinary: Positive for dysuria. Musculoskeletal: Negative for back pain. Skin: Negative for rash. Psychiatric: no mood changes,     ____________________________________________   PHYSICAL EXAM:  VITAL SIGNS: ED Triage Vitals  Enc Vitals  Group     BP 03/19/21 0935 137/77     Pulse Rate 03/19/21 0935 99     Resp 03/19/21 0935 16     Temp 03/19/21 0935 98.2 F (36.8 C)     Temp Source 03/19/21 0935 Oral     SpO2 03/19/21 0935 100 %     Weight 03/19/21 0936 165 lb (74.8 kg)     Height 03/19/21 0936 5\' 3"  (1.6 m)     Head Circumference --      Peak Flow --      Pain Score 03/19/21 0935 3      Pain Loc --      Pain Edu? --      Excl. in GC? --     Constitutional: Alert and oriented. Well appearing and in no acute distress. Eyes: Conjunctivae are normal.  Head: Atraumatic. Nose: No congestion/rhinnorhea. Mouth/Throat: Mucous membranes are moist.   Neck:  supple no lymphadenopathy noted Cardiovascular: Normal rate, regular rhythm.  Respiratory: Normal respiratory effort.  No retractions,  Abd: soft nontender bs normal all 4 quad GU: deferred Musculoskeletal: FROM all extremities, warm and well perfused Neurologic:  Normal speech and language.  Skin:  Skin is warm, dry and intact. No rash noted. Psychiatric: Mood and affect are normal. Speech and behavior are normal.  ____________________________________________   LABS (all labs ordered are listed, but only abnormal results are displayed)  Labs Reviewed  URINALYSIS, COMPLETE (UACMP) WITH MICROSCOPIC - Abnormal; Notable for the following components:      Result Value   Color, Urine YELLOW (*)    APPearance HAZY (*)    Hgb urine dipstick SMALL (*)    Bacteria, UA FEW (*)    All other components within normal limits  POC URINE PREG, ED - Normal   ____________________________________________   ____________________________________________  RADIOLOGY  CT renal stone  ____________________________________________   PROCEDURES  Procedure(s) performed: No  Procedures    ____________________________________________   INITIAL IMPRESSION / ASSESSMENT AND PLAN / ED COURSE  Pertinent labs & imaging results that were available during my care of the patient were reviewed by me and considered in my medical decision making (see chart for details).   Patient is a 30 year old female presents to the emergency department dysuria.  See HPI.  Physical exam shows patient per stable  Due to the patient's concerns of kidney stone she has only 1 kidney we will do CT renal stone study.  Urinalysis has small amount of  hemoglobin, few bacteria, pregnancy is negative  CT renal stone study reviewed by me confirmed by radiology to have active kidney, no kidney stones.  Radiologist comments about atrophic attached to the urinary system.  Patient was already aware of this finding.  She is to follow-up with urology if the dysuria does not improve with the antibiotic she is already taking.  She can also take Azo.  She was discharged stable condition.  She is agreeable treatment plan.  Melinda Mills was evaluated in Emergency Department on 03/19/2021 for the symptoms described in the history of present illness. She was evaluated in the context of the global COVID-19 pandemic, which necessitated consideration that the patient might be at risk for infection with the SARS-CoV-2 virus that causes COVID-19. Institutional protocols and algorithms that pertain to the evaluation of patients at risk for COVID-19 are in a state of rapid change based on information released by regulatory bodies including the CDC and federal and state organizations. These policies and algorithms were followed  during the patient's care in the ED.    As part of my medical decision making, I reviewed the following data within the electronic MEDICAL RECORD NUMBER Nursing notes reviewed and incorporated, Labs reviewed , Old chart reviewed, Radiograph reviewed , Notes from prior ED visits, and [employer] Controlled Substance Database  ____________________________________________   FINAL CLINICAL IMPRESSION(S) / ED DIAGNOSES  Final diagnoses:  Dysuria      NEW MEDICATIONS STARTED DURING THIS VISIT:  Discharge Medication List as of 03/19/2021 12:23 PM       Note:  This document was prepared using Dragon voice recognition software and may include unintentional dictation errors.    Faythe Ghee, PA-C 03/19/21 1316    Sharman Cheek, MD 03/24/21 [phone]

## 2021-03-19 NOTE — Discharge Instructions (Signed)
Follow up with urology if not improving in 3 days Return if worsening Finish your antibiotic Take otc azo

## 2021-03-19 NOTE — ED Triage Notes (Signed)
Pt to ED via POV, pt states that she has hx/o UTI. Pt states that Saturday night she started having burning with urination and urgency. Pt states that on Monday she noticed that she was having some blood in her urine. Pt went to urgent care Monday and started on antibiotics on Tuesday. Pt states that she is still having symptoms. Pt is in NAD.

## 2021-03-27 ENCOUNTER — Ambulatory Visit (INDEPENDENT_AMBULATORY_CARE_PROVIDER_SITE_OTHER): Payer: BC Managed Care – PPO | Admitting: Licensed Clinical Social Worker

## 2021-03-27 ENCOUNTER — Other Ambulatory Visit: Payer: Self-pay

## 2021-03-27 DIAGNOSIS — F4323 Adjustment disorder with mixed anxiety and depressed mood: Secondary | ICD-10-CM

## 2021-03-27 NOTE — Progress Notes (Addendum)
Virtual Visit via Audio Note  I connected with Melinda Mills on 03/27/21 at 11:00 AM EDT by an audio enabled telemedicine application and verified that I am speaking with the correct person using two identifiers.  Location: Patient: home Provider: remote office [place], [place])   I discussed the limitations of evaluation and management by telemedicine and the availability of in person appointments. The patient expressed understanding and agreed to proceed.   I discussed the assessment and treatment plan with the patient. The patient was provided an opportunity to ask questions and all were answered. The patient agreed with the plan and demonstrated an understanding of the instructions.   The patient was advised to call back or seek an in-person evaluation if the symptoms worsen or if the condition fails to improve as anticipated.  I provided 60 minutes of non-face-to-face time during this encounter.   Jayen Bromwell R Aliveah Gallant, LCSW   THERAPIST PROGRESS NOTE  Session Time: 11a-12p  Participation Level: Active  Behavioral Response: NAAlert  Type of Therapy: Individual Therapy  Treatment Goals addressed:  Problem: Decrease depressive symptoms and improve levels of effective functioning Goal: LTG: Reduce frequency, intensity, and duration of depression symptoms as evidenced by: pt self report Outcome: Progressing Goal: STG: Melinda "Kissa Somoza" WILL PARTICIPATE IN AT LEAST 80% OF SCHEDULED INDIVIDUAL PSYCHOTHERAPY SESSIONS Outcome: Progressing Problem: Reduce overall frequency, intensity, and duration of the anxiety so that daily functioning is not impaired. Goal: LTG: Patient will score less than 5 on pt self report scale (1-10). Outcome: Progressing Goal: STG-Patient can identify triggers for anxiety Outcome: Progressing Interventions:  Intervention: Encourage relaxation techniques Intervention: Assist with coping skills and behavior Intervention: Work with patient to identify  the major components of a recent episode of anxiety: physical symptoms, major thoughts and images, and major behaviors they experienced Intervention: Assist with coping skills and behavior Intervention: Give positive reinforcement and praise Intervention: Support useful positive self-talk Intervention: Perform motivational interviewing regarding physical activity Summary: Melinda Mills is a 30 y.o. female who presents with improving symptoms related to adjustment disorder diagnosis. Pt reports that overall mood is stable and that she is managing external stressors well. Pt reports that she is compliant with medications to manage symptoms.   Allowed pt to explore and express thoughts and feelings associated with recent life situations and external stressors. Discussed work-related stress and relationships with family members. Allowed pt to share coping skills that she feels are helpful and discussed others that could benefit from different focus.   Continued recommendations are as follows: self care behaviors, positive social engagements, focusing on overall work/home/life balance, and focusing on positive physical and emotional wellness.   Suicidal/Homicidal: No  Therapist Response: Pt is continuing to apply interventions learned in session into daily life situations. Pt is currently on track to meet goals utilizing interventions mentioned above. Personal growth and progress noted. Treatment to continue as indicated.   Plan: Return again in 4 weeks.  Diagnosis: Axis I: Adjustment disorder with mixed anxiety and depressed mood    Axis II: No diagnosis    Ernest Haber Kjuan Seipp, LCSW 03/27/2021

## 2021-03-30 NOTE — Plan of Care (Signed)
  Problem: Decrease depressive symptoms and improve levels of effective functioning Goal: LTG: Reduce frequency, intensity, and duration of depression symptoms as evidenced by: pt self report Outcome: Progressing Goal: STG: Melinda "Marena Wattley" WILL PARTICIPATE IN AT LEAST 80% OF SCHEDULED INDIVIDUAL PSYCHOTHERAPY SESSIONS Outcome: Progressing Intervention: WORK WITH Melinda "Leatta Silveria" TO IDENTIFY THE MAJOR COMPONENTS OF A RECENT EPISODE OF DEPRESSION: PHYSICAL SYMPTOMS, MAJOR THOUGHTS AND IMAGES, AND MAJOR BEHAVIORS THEY EXPERIENCED Intervention: Assist with coping skills and behavior Intervention: Give positive reinforcement and praise Intervention: Support useful positive self-talk Intervention: Perform motivational interviewing regarding physical activity   Problem: Reduce overall frequency, intensity, and duration of the anxiety so that daily functioning is not impaired. Goal: LTG: Patient will score less than 5 on pt self report scale (1-10). Outcome: Progressing Goal: STG-Patient can identify triggers for anxiety Outcome: Progressing Intervention: Encourage relaxation techniques Intervention: Assist with coping skills and behavior Intervention: Work with patient to identify the major components of a recent episode of anxiety: physical symptoms, major thoughts and images, and major behaviors they experienced

## 2021-04-03 ENCOUNTER — Encounter: Payer: Self-pay | Admitting: Psychiatry

## 2021-04-03 ENCOUNTER — Other Ambulatory Visit: Payer: Self-pay

## 2021-04-03 ENCOUNTER — Telehealth (INDEPENDENT_AMBULATORY_CARE_PROVIDER_SITE_OTHER): Payer: BC Managed Care – PPO | Admitting: Psychiatry

## 2021-04-03 DIAGNOSIS — F902 Attention-deficit hyperactivity disorder, combined type: Secondary | ICD-10-CM

## 2021-04-03 DIAGNOSIS — F3342 Major depressive disorder, recurrent, in full remission: Secondary | ICD-10-CM | POA: Diagnosis not present

## 2021-04-03 DIAGNOSIS — F5105 Insomnia due to other mental disorder: Secondary | ICD-10-CM

## 2021-04-03 DIAGNOSIS — G2581 Restless legs syndrome: Secondary | ICD-10-CM

## 2021-04-03 DIAGNOSIS — F411 Generalized anxiety disorder: Secondary | ICD-10-CM

## 2021-04-03 MED ORDER — ESCITALOPRAM OXALATE 20 MG PO TABS: 20.0000 mg | ORAL_TABLET | Freq: Every day | ORAL | 0 refills | Status: DC

## 2021-04-03 NOTE — Progress Notes (Signed)
Virtual Visit via Video Note  I connected with Mahealani Ingman on 04/03/21 at  9:00 AM EST by a video enabled telemedicine application and verified that I am speaking with the correct person using two identifiers.  Location Provider Location : [facility] Patient Location : Home  Participants: Patient , Provider    I discussed the limitations of evaluation and management by telemedicine and the availability of in person appointments. The patient expressed understanding and agreed to proceed.    I discussed the assessment and treatment plan with the patient. The patient was provided an opportunity to ask questions and all were answered. The patient agreed with the plan and demonstrated an understanding of the instructions.   The patient was advised to call back or seek an in-person evaluation if the symptoms worsen or if the condition fails to improve as anticipated.   BH MD OP Progress Note  04/03/2021 9:30 AM Flecia Shutter  MRN:  [id number]  Chief Complaint:  Chief Complaint   Follow-up; Anxiety    HPI: Diarra Ceja is a 30 year old Caucasian female, married, lives in [place], has a history of MDD, GAD, insomnia, ADHD was evaluated by telemedicine today.  Patient today reports she is currently doing well with regards to her mood.  She does not have any significant anxiety or depression.  Reports sleep has improved.  She takes the Lexapro in the morning and continues to be on the BuSpar twice a day.  Patient denies any side effects to her medications.  Patient reports she does struggle with lack of motivation some days and hence her ADHD provider increased her Mydayis dosage to 37.5 mg.  She reports she has been taking it since the past 2 weeks and is tolerating it well.  She has noticed some improvement.  Patient denies any suicidality, homicidality or perceptual disturbances.  Patient denies any other concerns today.  Visit Diagnosis:    ICD-10-CM   1. MDD (major depressive  disorder), recurrent, in full remission (HCC)  F33.42 TSH    escitalopram (LEXAPRO) 20 MG tablet    2. GAD (generalized anxiety disorder)  F41.1 TSH    escitalopram (LEXAPRO) 20 MG tablet    3. Insomnia due to mental condition  F51.05    mood    4. RLS (restless legs syndrome)  G25.81     5. ADHD (attention deficit hyperactivity disorder), combined type  F90.2       Past Psychiatric History: Reviewed past psychiatric history from progress note on 08/15/2018.  Past trials of Zoloft, Lexapro, BuSpar  Past Medical History:  Past Medical History:  Diagnosis Date   ADHD (attention deficit hyperactivity disorder)    Depression    Seizures (HCC)     Past Surgical History:  Procedure Laterality Date   APPENDECTOMY  2008/2009    Family Psychiatric History: Reviewed family psychiatric history from progress note on 08/15/2018  Family History:  Family History  Adopted: Yes    Social History: Reviewed social history from progress note on 08/15/2018 Social History   Socioeconomic History   Marital status: Married    Spouse name: Not on file   Number of children: 1   Years of education: Not on file   Highest education level: Bachelor's degree (e.g., BA, AB, BS)  Occupational History   Not on file  Tobacco Use   Smoking status: Never   Smokeless tobacco: Never  Vaping Use   Vaping Use: Never used  Substance and Sexual Activity   Alcohol use: Not Currently  Drug use: Not Currently    Types: Marijuana   Sexual activity: Yes  Other Topics Concern   Not on file  Social History Narrative   Not on file   Social Determinants of Health   Financial Resource Strain: Not on file  Food Insecurity: Not on file  Transportation Needs: Not on file  Physical Activity: Not on file  Stress: Not on file  Social Connections: Not on file    Allergies: No Known Allergies  Metabolic Disorder Labs: No results found for: HGBA1C, MPG No results found for: PROLACTIN No results found  for: CHOL, TRIG, HDL, CHOLHDL, VLDL, LDLCALC Lab Results  Component Value Date   TSH 3.640 08/15/2018    Therapeutic Level Labs: No results found for: LITHIUM No results found for: VALPROATE No components found for:  CBMZ  Current Medications: Current Outpatient Medications  Medication Sig Dispense Refill   BALZIVA 0.4-35 MG-MCG tablet Take 1 tablet by mouth daily.     busPIRone (BUSPAR) 10 MG tablet Take 1 tablet (10 mg total) by mouth 2 (two) times daily. 180 tablet 0   cephALEXin (KEFLEX) 500 MG capsule Take 500 mg by mouth 2 (two) times daily.     escitalopram (LEXAPRO) 20 MG tablet Take 1 tablet (20 mg total) by mouth daily. 90 tablet 0   hydrOXYzine (ATARAX/VISTARIL) 25 MG tablet TAKE 1/2-1 TABLETS (12.5-25 MG TOTAL) BY MOUTH 2 (TWO) TIMES DAILY AS NEEDED. 180 tablet 1   MYDAYIS 37.5 MG CP24 Take 1 capsule by mouth daily.     No current facility-administered medications for this visit.     Musculoskeletal: Strength & Muscle Tone:  UTA Gait & Station:  Seated Patient leans: N/A  Psychiatric Specialty Exam: Review of Systems  Psychiatric/Behavioral:  Negative for agitation, behavioral problems, dysphoric mood, hallucinations, self-injury, sleep disturbance and suicidal ideas. The patient is not nervous/anxious and is not hyperactive.   All other systems reviewed and are negative.  There were no vitals taken for this visit.There is no height or weight on file to calculate BMI.  General Appearance: Casual  Eye Contact:  Fair  Speech:  Clear and Coherent  Volume:  Normal  Mood:  Euthymic  Affect:  Congruent  Thought Process:  Goal Directed and Descriptions of Associations: Intact  Orientation:  Full (Time, Place, and Person)  Thought Content: Logical   Suicidal Thoughts:  No  Homicidal Thoughts:  No  Memory:  Immediate;   Fair Recent;   Fair Remote;   Fair  Judgement:  Fair  Insight:  Fair  Psychomotor Activity:  Normal  Concentration:  Concentration: Fair and  Attention Span: Fair  Recall:  [employer] of Knowledge: Fair  Language: Fair  Akathisia:  No  Handed:  Right  AIMS (if indicated): done  Assets:  Communication Skills Desire for Improvement Social Support  ADL's:  Intact  Cognition: WNL  Sleep:  Fair   Screenings: GAD-7    Flowsheet Row Video Visit from 09/10/2020 in [facility]  Total GAD-7 Score 5      PHQ2-9    Flowsheet Row Video Visit from 02/11/2021 in [facility] Video Visit from 01/27/2021 in [facility] Counselor from 08/01/2020 in [facility]  PHQ-2 Total Score 0 1 4  PHQ-9 Total Score 3 4 11       Flowsheet Row ED from 03/19/2021 in [facility] Video Visit from 01/27/2021 in [facility] Counselor from 08/01/2020  in [facility]  C-SSRS RISK CATEGORY No Risk Low Risk Low Risk        Assessment and Plan: Helina Hullum is a 30 year old Caucasian female, married, employed, lives in [place], has a history of MDD, GAD, history of seizure disorder was evaluated by telemedicine today.  Patient is currently doing well with regards to her mood and reports sleep is improving.  Plan as noted below.  Plan MDD in remission Lexapro 20 mg p.o. daily BuSpar 10 mg p.o. twice daily  GAD-improving Lexapro and BuSpar as prescribed  Insomnia-improving Continued sleep hygiene  ADHD-unstable Recently had medication readjustment, Mydayis-currently 37.5 mg p.o. daily. She will continue to follow-up with her ADHD specialist  Pending labs-TSH-encouraged compliance  Follow-up in clinic in 3 months or sooner in person.  This note was generated in part or whole with voice recognition software. Voice recognition is usually quite accurate but there are transcription errors that can and very often do occur. I apologize for any  typographical errors that were not detected and corrected.        Jomarie Longs, MD 04/03/2021, 9:30 AM

## 2021-04-04 LAB — TSH: TSH: 1.52 u[IU]/mL (ref 0.450–4.500)

## 2021-05-11 ENCOUNTER — Ambulatory Visit: Payer: Self-pay | Admitting: Licensed Clinical Social Worker

## 2021-05-11 ENCOUNTER — Other Ambulatory Visit: Payer: Self-pay

## 2021-05-11 NOTE — Progress Notes (Signed)
Connected with pt for scheduled appt--pt states she has bronchitis and is unable to engage in session today.  Rescheduled pt appointment.

## 2021-06-22 ENCOUNTER — Other Ambulatory Visit: Payer: Self-pay

## 2021-06-22 ENCOUNTER — Ambulatory Visit (INDEPENDENT_AMBULATORY_CARE_PROVIDER_SITE_OTHER): Payer: BC Managed Care – PPO | Admitting: Licensed Clinical Social Worker

## 2021-06-22 DIAGNOSIS — F4323 Adjustment disorder with mixed anxiety and depressed mood: Secondary | ICD-10-CM | POA: Diagnosis not present

## 2021-06-23 NOTE — Plan of Care (Signed)
°  Problem: Decrease depressive symptoms and improve levels of effective functioning ?Goal: LTG: Reduce frequency, intensity, and duration of depression symptoms as evidenced by: pt self report ?Outcome: Progressing ?Goal: STG: Natoria "Gearline Eubanks" WILL PARTICIPATE IN AT LEAST 80% OF SCHEDULED INDIVIDUAL PSYCHOTHERAPY SESSIONS ?Outcome: Progressing ?  ?Problem: Reduce overall frequency, intensity, and duration of the anxiety so that daily functioning is not impaired. ?Goal: LTG: Patient will score less than 5 on pt self report scale (1-10). ?Outcome: Progressing ?Goal: STG-Patient can identify triggers for anxiety ?Outcome: Progressing ?  ?

## 2021-06-23 NOTE — Progress Notes (Addendum)
Virtual Visit via Audio Note  I connected with Melinda Mills on 06/23/21 at  4:00 PM EST by an audio enabled telemedicine application and verified that I am speaking with the correct person using two identifiers.  Location: Patient: home Provider: remote office [place], [place])   I discussed the limitations of evaluation and management by telemedicine and the availability of in person appointments. The patient expressed understanding and agreed to proceed.   I discussed the assessment and treatment plan with the patient. The patient was provided an opportunity to ask questions and all were answered. The patient agreed with the plan and demonstrated an understanding of the instructions.   The patient was advised to call back or seek an in-person evaluation if the symptoms worsen or if the condition fails to improve as anticipated.  I provided 45 minutes of non-face-to-face time during this encounter.   Melinda Deasis R Raney Koeppen, LCSW   THERAPIST PROGRESS NOTE  Session Time: 4-445p  Participation Level: Active  Behavioral Response: NAAlert  Type of Therapy: Individual Therapy  Treatment Goals addressed:  Problem: Decrease depressive symptoms and improve levels of effective functioning Goal: LTG: Reduce frequency, intensity, and duration of depression symptoms as evidenced by: pt self report Outcome: Progressing  Goal: STG: Melinda "Coralynn Mills" WILL PARTICIPATE IN AT LEAST 80% OF SCHEDULED INDIVIDUAL PSYCHOTHERAPY SESSIONS Outcome: Progressing  Problem: Reduce overall frequency, intensity, and duration of the anxiety so that daily functioning is not impaired. Goal: LTG: Patient will score less than 5 on pt self report scale (1-10). Outcome: Progressing  Goal: STG-Patient can identify triggers for anxiety Outcome: Progressing Interventions:  Intervention: Encourage relaxation techniques Intervention: Assist with coping skills and behavior Intervention: Work with patient to  identify the major components of a recent episode of anxiety: physical symptoms, major thoughts and images, and major behaviors they experienced Intervention: Assist with coping skills and behavior Intervention: Give positive reinforcement and praise Intervention: Support useful positive self-talk Intervention: Perform motivational interviewing regarding physical activity  Summary: Melinda Mills is a 31 y.o. female who presents with improving symptoms related to adjustment disorder diagnosis. Pt reports that overall mood is stable and that she is managing external stressors well. Pt reports that she is compliant with medications to manage symptoms.   Allowed pt to explore and express thoughts and feelings associated with recent life situations and external stressors. Discussed work-related stress and relationships with family members.  Pt states that she is trying hard to "leave work at work".  Pt reports that she is trying harder to go to the gym and engage in physical activity as an intervention to manage stress. Praised pts efforts and encouraged other self-care activities.   Continued recommendations are as follows: self care behaviors, positive social engagements, focusing on overall work/home/life balance, and focusing on positive physical and emotional wellness.   Suicidal/Homicidal: No  Therapist Response: Pt is continuing to apply interventions learned in session into daily life situations. Pt is currently on track to meet goals utilizing interventions mentioned above. Personal growth and progress noted. Treatment to continue as indicated.   Plan: Return again in 4 weeks.  Diagnosis: Axis I: Adjustment disorder with mixed anxiety and depressed mood    Axis II: No diagnosis    Melinda Mills Allyssia Skluzacek, LCSW 06/23/2021

## 2021-06-25 ENCOUNTER — Ambulatory Visit: Payer: BC Managed Care – PPO | Admitting: Psychiatry

## 2021-07-21 ENCOUNTER — Encounter: Payer: Self-pay | Admitting: Psychiatry

## 2021-07-21 ENCOUNTER — Telehealth (INDEPENDENT_AMBULATORY_CARE_PROVIDER_SITE_OTHER): Payer: BC Managed Care – PPO | Admitting: Psychiatry

## 2021-07-21 ENCOUNTER — Other Ambulatory Visit: Payer: Self-pay

## 2021-07-21 DIAGNOSIS — F411 Generalized anxiety disorder: Secondary | ICD-10-CM

## 2021-07-21 DIAGNOSIS — F3342 Major depressive disorder, recurrent, in full remission: Secondary | ICD-10-CM | POA: Diagnosis not present

## 2021-07-21 DIAGNOSIS — F902 Attention-deficit hyperactivity disorder, combined type: Secondary | ICD-10-CM

## 2021-07-21 DIAGNOSIS — G2581 Restless legs syndrome: Secondary | ICD-10-CM | POA: Diagnosis not present

## 2021-07-21 DIAGNOSIS — F5105 Insomnia due to other mental disorder: Secondary | ICD-10-CM

## 2021-07-21 MED ORDER — HYDROXYZINE HCL 25 MG PO TABS: ORAL_TABLET | ORAL | 1 refills | Status: DC

## 2021-07-21 MED ORDER — BUSPIRONE HCL 10 MG PO TABS: 10.0000 mg | ORAL_TABLET | Freq: Two times a day (BID) | ORAL | 0 refills | Status: DC

## 2021-07-21 MED ORDER — ESCITALOPRAM OXALATE 20 MG PO TABS: 20.0000 mg | ORAL_TABLET | Freq: Every day | ORAL | 0 refills | Status: DC

## 2021-07-21 NOTE — Progress Notes (Signed)
Virtual Visit via Video Note  I connected with Melinda Mills on 07/21/21 at  4:20 PM EST by a video enabled telemedicine application and verified that I am speaking with the correct person using two identifiers.  Location Provider Location : [facility] Patient Location : Home  Participants: Patient , Provider   I discussed the limitations of evaluation and management by telemedicine and the availability of in person appointments. The patient expressed understanding and agreed to proceed.   I discussed the assessment and treatment plan with the patient. The patient was provided an opportunity to ask questions and all were answered. The patient agreed with the plan and demonstrated an understanding of the instructions.   The patient was advised to call back or seek an in-person evaluation if the symptoms worsen or if the condition fails to improve as anticipated.    [facility] MD OP Progress Note  07/21/2021 5:26 PM Pennee Mcilwain  MRN:  [id number]  Chief Complaint:  Chief Complaint  Patient presents with   Follow-up: 31 year old Caucasian female with history of MDD, GAD, insomnia, ADHD was evaluated for medication management.   HPI: Melinda Mills is a 31 year old Caucasian female, married, lives in [place], has a history of MDD, GAD, insomnia, ADHD was evaluated by telemedicine today.  Patient today reports overall mood symptoms are stable.  She is currently compliant on her Lexapro and BuSpar.  Denies side effects.  Patient however reports she continues to have procrastination, lack of motivation as well as the need to keep doing something all the time which likely is due to her ADHD.  Patient is currently on mydayis.  Currently follows up with Chrys Racer attention specialist.  Encouraged to also work with her therapist on the same.  Patient reports sleep is overall good.  She continues to stay busy at home and at work.  She also has been enrolled in a certification program with [employer] and has to  keep up with her home work which also adds on to the busy schedule.  Patient however reports overall she has been coping okay.  Denies any suicidality, homicidality or perceptual disturbances.  Denies any other concerns today.  Visit Diagnosis:    ICD-10-CM   1. MDD (major depressive disorder), recurrent, in full remission ([place])  F33.42 escitalopram (LEXAPRO) 20 MG tablet    2. GAD (generalized anxiety disorder)  F41.1 escitalopram (LEXAPRO) 20 MG tablet    busPIRone (BUSPAR) 10 MG tablet    hydrOXYzine (ATARAX) 25 MG tablet    3. Insomnia due to mental condition  F51.05    mood    4. RLS (restless legs syndrome)  G25.81     5. ADHD (attention deficit hyperactivity disorder), combined type  F90.2       Past Psychiatric History: Reviewed past psychiatric history from progress note on 08/15/2018.  Past trials of Zoloft, Lexapro, BuSpar.  Past Medical History:  Past Medical History:  Diagnosis Date   ADHD (attention deficit hyperactivity disorder)    Depression    Seizures ([place])     Past Surgical History:  Procedure Laterality Date   APPENDECTOMY  2008/2009    Family Psychiatric History: Reviewed family psychiatric history from progress note on 08/15/2018.  Family History:  Family History  Adopted: Yes    Social History: Reviewed social history from progress note on 08/15/2018. Social History   Socioeconomic History   Marital status: Married    Spouse name: Not on file   Number of children: 1   Years of education: Not  on file   Highest education level: Bachelor's degree (e.g., BA, AB, BS)  Occupational History   Not on file  Tobacco Use   Smoking status: Never   Smokeless tobacco: Never  Vaping Use   Vaping Use: Never used  Substance and Sexual Activity   Alcohol use: Not Currently   Drug use: Not Currently    Types: Marijuana   Sexual activity: Yes  Other Topics Concern   Not on file  Social History Narrative   Not on file   Social Determinants of  Health   Financial Resource Strain: Not on file  Food Insecurity: Not on file  Transportation Needs: Not on file  Physical Activity: Not on file  Stress: Not on file  Social Connections: Not on file    Allergies: No Known Allergies  Metabolic Disorder Labs: No results found for: HGBA1C, MPG No results found for: PROLACTIN No results found for: CHOL, TRIG, HDL, CHOLHDL, VLDL, LDLCALC Lab Results  Component Value Date   TSH 1.520 04/03/2021   TSH 3.640 08/15/2018    Therapeutic Level Labs: No results found for: LITHIUM No results found for: VALPROATE No components found for:  CBMZ  Current Medications: Current Outpatient Medications  Medication Sig Dispense Refill   BALZIVA 0.4-35 MG-MCG tablet Take 1 tablet by mouth daily.     busPIRone (BUSPAR) 10 MG tablet Take 1 tablet (10 mg total) by mouth 2 (two) times daily. 180 tablet 0   escitalopram (LEXAPRO) 20 MG tablet Take 1 tablet (20 mg total) by mouth daily. 90 tablet 0   hydrOXYzine (ATARAX) 25 MG tablet TAKE 1/2-1 TABLETS (12.5-25 MG TOTAL) BY MOUTH 2 (TWO) TIMES DAILY AS NEEDED. 180 tablet 1   MYDAYIS 37.5 MG CP24 Take 1 capsule by mouth daily.     No current facility-administered medications for this visit.     Musculoskeletal: Strength & Muscle Tone:  UTA Gait & Station:  Seated Patient leans: N/A  Psychiatric Specialty Exam: Review of Systems  Psychiatric/Behavioral:  Negative for agitation, behavioral problems, confusion, decreased concentration, dysphoric mood, hallucinations, self-injury, sleep disturbance and suicidal ideas. The patient is not nervous/anxious and is not hyperactive.   All other systems reviewed and are negative.  There were no vitals taken for this visit.There is no height or weight on file to calculate BMI.  General Appearance: Casual  Eye Contact:  Fair  Speech:  Clear and Coherent  Volume:  Normal  Mood:  Euthymic  Affect:  Congruent  Thought Process:  Goal Directed and Descriptions  of Associations: Intact  Orientation:  Full (Time, Place, and Person)  Thought Content: Logical   Suicidal Thoughts:  No  Homicidal Thoughts:  No  Memory:  Immediate;   Fair Recent;   Fair Remote;   Fair  Judgement:  Fair  Insight:  Fair  Psychomotor Activity:  Normal  Concentration:  Concentration: Fair and Attention Span: Fair  Recall:  [employer] of Knowledge: Fair  Language: Fair  Akathisia:  No  Handed:  Right  AIMS (if indicated): not done  Assets:  Communication Skills Desire for [place] Talents/Skills Transportation Vocational/Educational  ADL's:  Intact  Cognition: WNL  Sleep:  Fair   Screenings: GAD-7    Flowsheet Row Video Visit from 09/10/2020 in [facility]  Total GAD-7 Score 5      PHQ2-9    Flowsheet Row Video Visit from 07/21/2021 in [facility] Counselor from 06/22/2021 in [facility]  Video Visit from 02/11/2021 in [facility] Video Visit from 01/27/2021 in [facility] from 08/01/2020 in [facility]  PHQ-2 Total Score 0 0 0 1 4  PHQ-9 Total Score -- -- 3 4 11       Flowsheet Row Counselor from 06/22/2021 in [facility] from 03/19/2021 in [facility] Video Visit from 01/27/2021 in [facility] No Risk No Risk Low Risk        Assessment and Plan: Tajee Bittel is a 31 year old Caucasian female, married, employed, lives in [place], has a history of MDD, GAD, history of seizure disorder was evaluated by telemedicine today.  Patient is currently overall doing fairly well with regards to her mood.  Plan as noted below.  Plan MDD in remission Lexapro 20 mg p.o. daily BuSpar 10 mg p.o. twice daily  GAD-stable Lexapro  and BuSpar as prescribed Continue CBT with Ms. Christina Hussami  Insomnia-stable Continued sleep hygiene techniques  ADHD-improving Patient encouraged to follow up with therapist, CBT for ADHD symptoms Continue to follow up with Chrys Racer attention specialist. Continue Mydayis 37.5 mg p.o. daily  Pending labs-TSH.  Follow-up in clinic in 3 months or sooner in person.  Collaboration of Care: Collaboration of Care: Referral or follow-up with counselor/therapist AEB continue to follow up with therapist as well as ADHD specialist.  Patient/Guardian was advised Release of Information must be obtained prior to any record release in order to collaborate their care with an outside provider. Patient/Guardian was advised if they have not already done so to contact the registration department to sign all necessary forms in order for [place] to release information regarding their care.   Consent: Patient/Guardian gives verbal consent for treatment and assignment of benefits for services provided during this visit. Patient/Guardian expressed understanding and agreed to proceed.   This note was generated in part or whole with voice recognition software. Voice recognition is usually quite accurate but there are transcription errors that can and very often do occur. I apologize for any typographical errors that were not detected and corrected.      Ursula Alert, MD 07/22/2021, 1:16 PM

## 2021-08-03 ENCOUNTER — Ambulatory Visit (INDEPENDENT_AMBULATORY_CARE_PROVIDER_SITE_OTHER): Payer: BC Managed Care – PPO | Admitting: Licensed Clinical Social Worker

## 2021-08-03 ENCOUNTER — Other Ambulatory Visit: Payer: Self-pay

## 2021-08-03 DIAGNOSIS — F4323 Adjustment disorder with mixed anxiety and depressed mood: Secondary | ICD-10-CM

## 2021-08-03 NOTE — Plan of Care (Signed)
?  Problem: Decrease depressive symptoms and improve levels of effective functioning ?Goal: LTG: Reduce frequency, intensity, and duration of depression symptoms as evidenced by: pt self report ?Outcome: Progressing ?Goal: STG: Luciana "Leota Esh" WILL PARTICIPATE IN AT LEAST 80% OF SCHEDULED INDIVIDUAL PSYCHOTHERAPY SESSIONS ?Outcome: Progressing ?  ?Problem: Reduce overall frequency, intensity, and duration of the anxiety so that daily functioning is not impaired. ?Goal: LTG: Patient will score less than 5 on pt self report scale (1-10). ?Outcome: Progressing ?Goal: STG-Patient can identify triggers for anxiety ?Outcome: Progressing ?  ?

## 2021-08-03 NOTE — Plan of Care (Signed)
?  Problem: Decrease depressive symptoms and improve levels of effective functioning ?Goal: LTG: Reduce frequency, intensity, and duration of depression symptoms as evidenced by: pt self report ?Outcome: Progressing ?Goal: STG: Melinda "Adelia Mazurek" WILL PARTICIPATE IN AT LEAST 80% OF SCHEDULED INDIVIDUAL PSYCHOTHERAPY SESSIONS ?Outcome: Progressing ?  ?

## 2021-08-03 NOTE — Progress Notes (Signed)
Virtual Visit via Audio Note ? ?I connected with Melinda Mills on 08/03/21 at  4:00 PM EDT by an audio enabled telemedicine application and verified that I am speaking with the correct person using two identifiers. ? ?Location: ?Patient: home ?Provider: remote office [place], [place]) ?  ?I discussed the limitations of evaluation and management by telemedicine and the availability of in person appointments. The patient expressed understanding and agreed to proceed. ?  ?I discussed the assessment and treatment plan with the patient. The patient was provided an opportunity to ask questions and all were answered. The patient agreed with the plan and demonstrated an understanding of the instructions. ?  ?The patient was advised to call back or seek an in-person evaluation if the symptoms worsen or if the condition fails to improve as anticipated. ? ?I provided 60 minutes of non-face-to-face time during this encounter. ? ? ?Jovante Hammitt R Kaheem Halleck, LCSW ? ? ?THERAPIST PROGRESS NOTE ? ?Session Time: 4-5p ? ?Participation Level: Active ? ?Behavioral Response: NAAlert ? ?Type of Therapy: Individual Therapy ? ?Treatment Goals addressed:  ?Problem: Decrease depressive symptoms and improve levels of effective functioning ?Goal: LTG: Reduce frequency, intensity, and duration of depression symptoms as evidenced by: pt self report ?Outcome: Progressing ? ?Goal: STG: Melinda "Daphanie Cerone" WILL PARTICIPATE IN AT LEAST 80% OF SCHEDULED INDIVIDUAL PSYCHOTHERAPY SESSIONS ?Outcome: Progressing ? ?Problem: Reduce overall frequency, intensity, and duration of the anxiety so that daily functioning is not impaired. ?Goal: LTG: Patient will score less than 5 on pt self report scale (1-10). ?Outcome: Progressing ? ?Goal: STG-Patient can identify triggers for anxiety ?Outcome: Progressing ?Interventions:  ?Intervention: Encourage relaxation techniques ?Intervention: Assist with coping skills and behavior ?Intervention: Work with patient to  identify the major components of a recent episode of anxiety: physical symptoms, major thoughts and images, and major behaviors they experienced ?Intervention: Assist with coping skills and behavior ?Intervention: Give positive reinforcement and praise ?Intervention: Support useful positive self-talk ?Intervention: Perform motivational interviewing regarding physical activity ? ?Summary: Melinda Mills is a 31 y.o. female who presents with improving symptoms related to adjustment disorder diagnosis. Pt reports that overall mood is stable and that she is managing external stressors well. Pt reports that she is compliant with medications to manage symptoms.  ? ?Allowed pt to explore and express thoughts and feelings associated with recent life situations and external stressors. 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 Pt reports stress over colleague/friend that is supposed to be helping make earrings for their side job--pt states that she feels her friend is not as involved and was not answering/communicating with pt as well as she has in the past.  ? ?Pt feels she doesn't have life balance--feels overwhelmed by the things that she has to do in a day. Pt states that she often feels like she needs to be doing something all of the time and keeping her mind busy all of the time. Discussed incorporating more single focus activities into her nighttime routine versy multi focus activities (phone/tablet). Discussed limiting sensory stimulation while engaging in the activities. Pt reflects understanding and wants to try coloring. Discussed working on more mindfulness at next session after pt is comfortable with single focus activities.  ? ?Pt reports relationships doing well at home and with family. ? ?Continued recommendations are as follows: self care behaviors, positive social engagements, focusing on overall work/home/life balance, and  focusing on positive physical and emotional wellness.  ? ?Suicidal/Homicidal:  No ? ?Therapist Response: Pt is continuing to apply interventions learned in session into daily life situations. Pt is currently on track to meet goals utilizing interventions mentioned above. Personal growth and progress noted. Treatment to continue as indicated.  ? ?Plan: Return again in 4 weeks. ? ?Encounter Diagnosis  ?Name Primary?  ? Adjustment disorder with mixed anxiety and depressed mood Yes  ? ?Collaboration of Care: Other pt encouraged to continue care with psychiatrist of record, Dr. Shea Evans ? ?Patient/Guardian was advised Release of Information must be obtained prior to any record release in order to collaborate their care with an outside provider. Patient/Guardian was advised if they have not already done so to contact the registration department to sign all necessary forms in order for [place] to release information regarding their care.  ? ?Consent: Patient/Guardian gives verbal consent for treatment and assignment of benefits for services provided during this visit. Patient/Guardian expressed understanding and agreed to proceed.  ? ? ? ?Riggins Cisek R Cassadie Pankonin, LCSW ?08/03/2021 ? ?

## 2021-09-15 ENCOUNTER — Ambulatory Visit (INDEPENDENT_AMBULATORY_CARE_PROVIDER_SITE_OTHER): Payer: BC Managed Care – PPO | Admitting: Licensed Clinical Social Worker

## 2021-09-15 ENCOUNTER — Telehealth: Payer: BC Managed Care – PPO | Admitting: Psychiatry

## 2021-09-15 DIAGNOSIS — F4323 Adjustment disorder with mixed anxiety and depressed mood: Secondary | ICD-10-CM | POA: Diagnosis not present

## 2021-09-15 NOTE — Progress Notes (Signed)
Virtual Visit via Video Note ? ?I connected with Melinda Mills on 09/18/21 at  4:00 PM EDT by a video enabled telemedicine application and verified that I am speaking with the correct person using two identifiers. ? ?Location: ?Patient: home ?Provider: remote office [place] [facility] [place], [place]) ?  ?I discussed the limitations of evaluation and management by telemedicine and the availability of in person appointments. The patient expressed understanding and agreed to proceed. ?  ?I discussed the assessment and treatment plan with the patient. The patient was provided an opportunity to ask questions and all were answered. The patient agreed with the plan and demonstrated an understanding of the instructions. ?  ?The patient was advised to call back or seek an in-person evaluation if the symptoms worsen or if the condition fails to improve as anticipated. ? ?I provided 60 minutes of non-face-to-face time during this encounter. ? ? ?Melinda Dung R Deannie Resetar, LCSW ? ? ?THERAPIST PROGRESS NOTE ? ?Session Time: 4-5p ? ?Participation Level: Active ? ?Behavioral Response: NAAlert ? ?Type of Therapy: Individual Therapy ? ?Treatment Goals addressed: Problem: Decrease depressive symptoms and improve levels of effective functioning ?Goal: LTG: Reduce frequency, intensity, and duration of depression symptoms as evidenced by: pt self report ?Outcome: Progressing ?Goal: STG: Melinda "Ajee Backes" WILL PARTICIPATE IN AT LEAST 80% OF SCHEDULED INDIVIDUAL PSYCHOTHERAPY SESSIONS ?Outcome: Progressing ?  ?Problem: Reduce overall frequency, intensity, and duration of the anxiety so that daily functioning is not impaired. ?Goal: LTG: Patient will score less than 5 on pt self report scale (1-10). ?Outcome: Progressing ?Goal: STG-Patient can identify triggers for anxiety ?Outcome: Progressing ?  ?Interventions:  ?Intervention: Encourage relaxation techniques ?Intervention: Assist with coping skills and behavior ?Intervention: Work with patient to identify  the major components of a recent episode of anxiety: physical symptoms, major thoughts and images, and major behaviors they experienced ?Intervention: Assist with coping skills and behavior ?Intervention: Give positive reinforcement and praise ?Intervention: Support useful positive self-talk ?Intervention: Perform motivational interviewing regarding physical activity ? ?Summary: Melinda Mills is a 31 y.o. female who presents with continuing symptoms related to adjustment disorder diagnosis.  ? ?Allowed pt to explore and express thoughts and feelings associated with recent life situations and external stressors. Patient reports that she is continuing to have some work related stress. Explored patients relationship, and patient is unsure whether she wants to stay in her current relationship or not. Patient reports that she has been experiencing some intense feelings and conflict recently, and that she and her spouse are trying to work through it. Patient reports that she feels very bored at times. Patient reports that she knows she has an attitude, and feels like that she can't be herself around her husband and his friends. Patient reports that she is struggling with her ADHD a lot recently. Patient reports that she also has another counselor through [facility], and her better [facility] counselor stated that she may have bipolar disorder. Patient was asking questions about this--explain to patient to please discuss this with psychiatrist. Discussed with patient how high functioning she is right now job wise and being able to manage her life well. Patient reports that she has a friend named Melinda John that she has been talking to quite a bit about struggles in her marriage. Encouraged patient to seek professional counseling before making the decision to end the marriage.  ? ?Continued recommendations are as follows: self care behaviors, positive social engagements, focusing on overall work/home/life balance, and focusing on  positive physical and emotional wellness.  ? ?Suicidal/Homicidal:  No ? ?Therapist Response: Pt is continuing to apply interventions learned in session into daily life situations. Pt is currently on track to meet goals utilizing interventions mentioned above. Personal growth and progress noted. Treatment to continue as indicated.  ? ?Plan: Return again in 4 weeks. ? ?Encounter Diagnosis  ?Name Primary?  ? Adjustment disorder with mixed anxiety and depressed mood Yes  ? ? ?Collaboration of Care: Other pt encouraged to continue care with psychiatrist of record, Melinda Mills ? ?Patient/Guardian was advised Release of Information must be obtained prior to any record release in order to collaborate their care with an outside provider. Patient/Guardian was advised if they have not already done so to contact the registration department to sign all necessary forms in order for [place] to release information regarding their care.  ? ?Consent: Patient/Guardian gives verbal consent for treatment and assignment of benefits for services provided during this visit. Patient/Guardian expressed understanding and agreed to proceed.  ? ? ? ?Camay Pedigo R Malikah Principato, LCSW ?09/18/2021 ? ?

## 2021-09-18 NOTE — Plan of Care (Signed)
?  Problem: Decrease depressive symptoms and improve levels of effective functioning ?Goal: LTG: Reduce frequency, intensity, and duration of depression symptoms as evidenced by: pt self report ?Outcome: Progressing ?Goal: STG: Melinda "Idabelle Rust" WILL PARTICIPATE IN AT LEAST 80% OF SCHEDULED INDIVIDUAL PSYCHOTHERAPY SESSIONS ?Outcome: Progressing ?  ?Problem: Reduce overall frequency, intensity, and duration of the anxiety so that daily functioning is not impaired. ?Goal: LTG: Patient will score less than 5 on pt self report scale (1-10). ?Outcome: Progressing ?Goal: STG-Patient can identify triggers for anxiety ?Outcome: Progressing ?  ?

## 2021-11-03 ENCOUNTER — Ambulatory Visit: Payer: BC Managed Care – PPO | Admitting: Psychiatry

## 2021-11-03 ENCOUNTER — Encounter: Payer: Self-pay | Admitting: Psychiatry

## 2021-11-03 VITALS — BP 129/84 | HR 88 | Temp 98.7°F | Wt 175.2 lb

## 2021-11-03 DIAGNOSIS — F5105 Insomnia due to other mental disorder: Secondary | ICD-10-CM | POA: Diagnosis not present

## 2021-11-03 DIAGNOSIS — F3342 Major depressive disorder, recurrent, in full remission: Secondary | ICD-10-CM

## 2021-11-03 DIAGNOSIS — F4323 Adjustment disorder with mixed anxiety and depressed mood: Secondary | ICD-10-CM

## 2021-11-03 DIAGNOSIS — F411 Generalized anxiety disorder: Secondary | ICD-10-CM | POA: Diagnosis not present

## 2021-11-03 DIAGNOSIS — G2581 Restless legs syndrome: Secondary | ICD-10-CM | POA: Diagnosis not present

## 2021-11-03 DIAGNOSIS — F902 Attention-deficit hyperactivity disorder, combined type: Secondary | ICD-10-CM

## 2021-11-03 NOTE — Patient Instructions (Addendum)
Counsel[place] - [phone]  [email or web address]  www.[email or web address]

## 2021-11-03 NOTE — Progress Notes (Unsigned)
[place] MD OP Progress Note  11/03/2021 1:42 PM Melinda Mills  MRN:  [id number]  Chief Complaint:  Chief Complaint  Patient presents with   Follow-up: 31 year old Caucasian female with history of MDD, GAD, insomnia, ADHD was evaluated for medication management.   HPI: Melinda Mills is a 31 year old Caucasian female, separated, lives in [place], has a history of MDD, GAD, insomnia, ADHD was evaluated in office today.  Patient today reports she is currently separated from her spouse.  She moved into an [address] 2 weeks ago.  They are currently sharing custody of their 36-year-old child.  Patient reports she is planning to work on the relationship, interested in family therapy.  Patient otherwise reports she has been managing her anxiety and depressive symptoms fairly okay given the situational stressors.  She would like to continue individual psychotherapy.  Stopped taking the Lexapro few days ago.  Would like to just stay on the BuSpar for now.  Continues to take Amo for ADHD symptoms.  It was changed to methylphenidate recently however she did not believe it was helpful and hence went back on the [place].  Patient denies any suicidality, homicidality or perceptual disturbances.  Patient denies any other concerns today.  Visit Diagnosis:    ICD-10-CM   1. MDD (major depressive disorder), recurrent, in full remission ([place])  F33.42     2. GAD (generalized anxiety disorder)  F41.1     3. Insomnia due to mental condition  F51.05    mood    4. RLS (restless legs syndrome)  G25.81     5. Adjustment disorder with mixed anxiety and depressed mood  F43.23     6. ADHD (attention deficit hyperactivity disorder), combined type  F90.2       Past Psychiatric History: Reviewed past psychiatric history from progress note on 08/15/2018.  Past trials of Zoloft, Lexapro, BuSpar.  Past Medical History:  Past Medical History:  Diagnosis Date   ADHD (attention deficit hyperactivity disorder)     Depression    Seizures ([place])     Past Surgical History:  Procedure Laterality Date   APPENDECTOMY  2008/2009    Family Psychiatric History: Reviewed family psychiatric history from progress note on 08/15/2018.  Family History:  Family History  Adopted: Yes    Social History: Reviewed social history from progress note on 08/15/2018. Social History   Socioeconomic History   Marital status: Legally Separated    Spouse name: Not on file   Number of children: 1   Years of education: Not on file   Highest education level: Bachelor's degree (e.g., BA, AB, BS)  Occupational History   Not on file  Tobacco Use   Smoking status: Never   Smokeless tobacco: Never  Vaping Use   Vaping Use: Never used  Substance and Sexual Activity   Alcohol use: Not Currently   Drug use: Not Currently    Types: Marijuana   Sexual activity: Yes  Other Topics Concern   Not on file  Social History Narrative   Not on file   Social Determinants of Health   Financial Resource Strain: Low Risk  (08/15/2018)   Overall Financial Resource Strain (CARDIA)    Difficulty of Paying Living Expenses: Not very hard  Food Insecurity: No Food Insecurity (02/09/2018)   Hunger Vital Sign    Worried About Running Out of Food in the Last Year: Never true    Ran Out of Food in the Last Year: Never true  Transportation Needs: No Transportation Needs (02/09/2018)  PRAPARE - Hydrologist (Medical): No    Lack of Transportation (Non-Medical): No  Physical Activity: Inactive (02/09/2018)   Exercise Vital Sign    Days of Exercise per Week: 0 days    Minutes of Exercise per Session: 0 min  Stress: No Stress Concern Present (02/09/2018)   [place]    Feeling of Stress : Not at all  Social Connections: Unknown (08/15/2018)   Social Connection and Isolation Panel [NHANES]    Frequency of Communication with Friends and Family: Not  on file    Frequency of Social Gatherings with Friends and Family: Not on file    Attends Religious Services: Not on file    Active Member of Clubs or Organizations: Not on file    Attends Archivist Meetings: Not on file    Marital Status: Living with partner    Allergies: No Known Allergies  Metabolic Disorder Labs: No results found for: "HGBA1C", "MPG" No results found for: "PROLACTIN" No results found for: "CHOL", "TRIG", "HDL", "CHOLHDL", "VLDL", "[place]" Lab Results  Component Value Date   TSH 1.520 04/03/2021   TSH 3.640 08/15/2018    Therapeutic Level Labs: No results found for: "LITHIUM" No results found for: "VALPROATE" No results found for: "CBMZ"  Current Medications: Current Outpatient Medications  Medication Sig Dispense Refill   BALZIVA 0.4-35 MG-MCG tablet Take 1 tablet by mouth daily.     busPIRone (BUSPAR) 10 MG tablet Take 1 tablet (10 mg total) by mouth 2 (two) times daily. 180 tablet 0   hydrOXYzine (ATARAX) 25 MG tablet TAKE 1/2-1 TABLETS (12.5-25 MG TOTAL) BY MOUTH 2 (TWO) TIMES DAILY AS NEEDED. 180 tablet 1   MYDAYIS 37.5 MG CP24 Take 1 capsule by mouth daily.     methylphenidate (METADATE CD) 40 MG CR capsule Take 40 mg by mouth daily. (Patient not taking: Reported on 11/03/2021)     No current facility-administered medications for this visit.     Musculoskeletal: Strength & Muscle Tone: within normal limits Gait & Station: normal Patient leans: N/A  Psychiatric Specialty Exam: Review of Systems  Psychiatric/Behavioral:  The patient is nervous/anxious.        Sad about recent relationship problems  All other systems reviewed and are negative.   Blood pressure 129/84, pulse 88, temperature 98.7 F (37.1 C), temperature source Temporal, weight 175 lb 3.2 oz (79.5 kg).Body mass index is 31.04 kg/m.  General Appearance: Casual  Eye Contact:  Fair  Speech:  Clear and Coherent  Volume:  Normal  Mood:  Anxious and sad about recent  situational stressors  Affect:  Congruent  Thought Process:  Goal Directed and Descriptions of Associations: Intact  Orientation:  Full (Time, Place, and Person)  Thought Content: Logical   Suicidal Thoughts:  No  Homicidal Thoughts:  No  Memory:  Immediate;   Fair Recent;   Fair Remote;   Fair  Judgement:  Fair  Insight:  Fair  Psychomotor Activity:  Normal  Concentration:  Concentration: Fair and Attention Span: Fair  Recall:  [employer] of Knowledge: Fair  Language: Fair  Akathisia:  No  Handed:  Right  AIMS (if indicated): not done  Assets:  Communication Skills Desire for Improvement Housing Social Support  ADL's:  Intact  Cognition: WNL  Sleep:  Fair   Screenings: GAD-7    Flowsheet Row Video Visit from 09/10/2020 in [facility]  Total GAD-7 Score 5  [place] Office Visit from 11/03/2021 in [facility] Video Visit from 07/21/2021 in [facility] Counselor from 06/22/2021 in [facility] Video Visit from 02/11/2021 in [facility] Video Visit from 01/27/2021 in [facility]  PHQ-2 Total Score 2 0 0 0 1  PHQ-9 Total Score 12 -- -- 3 4      [place] Office Visit from 11/03/2021 in [facility] Counselor from 06/22/2021 in [facility] ED from 03/19/2021 in [facility] CATEGORY Low Risk No Risk No Risk        Assessment and Plan: Latayvia Hayenga is a 31 year old Caucasian female, separated, employed, lives in [place], has a history of MDD, GAD, history of seizure disorder was evaluated in office today.  Patient is currently separated from her spouse, interested in pursuing family therapy.  Discussed plan as noted below.  Plan MDD in remission BuSpar 10 mg p.o. twice  daily.  GAD-unstable Currently worried about her situational stressors, patient to continue psychotherapy sessions. Discontinue Lexapro, patient stopped taking it would like to get off of some of her medications. Patient to monitor for discontinuation syndrome or worsening mood symptoms. Continue BuSpar as prescribed  Adjustment disorder with depression and anxiety-unstable Referral for family counseling since her mood symptoms are related to her relationship struggles. Continue individual psychotherapy.  Provided community resources.  Insomnia-stable Continue sleep hygiene techniques.  ADHD-stable Continue Mydayis 37.5 mg p.o. daily-prescribed by [place] attention specialist.  Follow-up in clinic in 2 to 3 months or sooner if needed.  Patient to pursue family therapy and call the clinic back as needed if she needs further medication changes.   This note was generated in part or whole with voice recognition software. Voice recognition is usually quite accurate but there are transcription errors that can and very often do occur. I apologize for any typographical errors that were not detected and corrected.     Melinda Alert, MD 11/04/2021, 9:43 AM

## 2021-11-05 ENCOUNTER — Telehealth: Payer: Self-pay | Admitting: Licensed Clinical Social Worker

## 2021-11-05 ENCOUNTER — Ambulatory Visit (INDEPENDENT_AMBULATORY_CARE_PROVIDER_SITE_OTHER): Payer: Self-pay | Admitting: Licensed Clinical Social Worker

## 2021-11-05 DIAGNOSIS — Z91199 Patient's noncompliance with other medical treatment and regimen due to unspecified reason: Secondary | ICD-10-CM

## 2021-11-05 NOTE — Progress Notes (Signed)
LCSW counselor tried to connect with patient for scheduled appointment via MyChart video text request x 2 and email request; also tried to connect via phone without success. LCSW counselor left message for patient to call office number to reschedule OPT appointment.   Attempt 1: Text and email: 3:04p  Attempt 2: Text and email: 3:06p  Attempt 3: phone call:  Left message to call office to reschedule appt.   Visit will be coded as no show 

## 2021-11-05 NOTE — Telephone Encounter (Signed)
LCSW counselor tried to connect with patient for scheduled appointment via MyChart video text request x 2 and email request; also tried to connect via phone without success. LCSW counselor left message for patient to call office number to reschedule OPT appointment.   Attempt 1: Text and email: 3:04p  Attempt 2: Text and email: 3:06p  Attempt 3: phone call:  Left message to call office to reschedule appt.   Visit will be coded as no show

## 2021-12-14 ENCOUNTER — Emergency Department
Admission: EM | Admit: 2021-12-14 | Discharge: 2021-12-15 | Disposition: A | Discharge status: Other Acute Inpatient Hospital | Payer: BC Managed Care – PPO | Attending: Emergency Medicine | Admitting: Emergency Medicine

## 2021-12-14 ENCOUNTER — Other Ambulatory Visit: Payer: Self-pay

## 2021-12-14 DIAGNOSIS — F410 Panic disorder [episodic paroxysmal anxiety] without agoraphobia: Secondary | ICD-10-CM | POA: Diagnosis not present

## 2021-12-14 DIAGNOSIS — R45851 Suicidal ideations: Secondary | ICD-10-CM | POA: Insufficient documentation

## 2021-12-14 DIAGNOSIS — F321 Major depressive disorder, single episode, moderate: Secondary | ICD-10-CM | POA: Insufficient documentation

## 2021-12-14 DIAGNOSIS — G40909 Epilepsy, unspecified, not intractable, without status epilepticus: Secondary | ICD-10-CM | POA: Diagnosis not present

## 2021-12-14 DIAGNOSIS — Z820 Family history of epilepsy and other diseases of the nervous system: Secondary | ICD-10-CM

## 2021-12-14 DIAGNOSIS — F5105 Insomnia due to other mental disorder: Secondary | ICD-10-CM | POA: Diagnosis not present

## 2021-12-14 DIAGNOSIS — R5383 Other fatigue: Secondary | ICD-10-CM | POA: Diagnosis present

## 2021-12-14 DIAGNOSIS — F902 Attention-deficit hyperactivity disorder, combined type: Secondary | ICD-10-CM | POA: Diagnosis present

## 2021-12-14 DIAGNOSIS — F4323 Adjustment disorder with mixed anxiety and depressed mood: Secondary | ICD-10-CM | POA: Diagnosis present

## 2021-12-14 DIAGNOSIS — G2581 Restless legs syndrome: Secondary | ICD-10-CM | POA: Diagnosis not present

## 2021-12-14 DIAGNOSIS — F411 Generalized anxiety disorder: Secondary | ICD-10-CM | POA: Diagnosis present

## 2021-12-14 DIAGNOSIS — O99345 Other mental disorders complicating the puerperium: Secondary | ICD-10-CM | POA: Diagnosis present

## 2021-12-14 DIAGNOSIS — F329 Major depressive disorder, single episode, unspecified: Secondary | ICD-10-CM | POA: Diagnosis not present

## 2021-12-14 DIAGNOSIS — Z87898 Personal history of other specified conditions: Secondary | ICD-10-CM

## 2021-12-14 DIAGNOSIS — Z20822 Contact with and (suspected) exposure to covid-19: Secondary | ICD-10-CM | POA: Insufficient documentation

## 2021-12-14 DIAGNOSIS — F32A Depression, unspecified: Secondary | ICD-10-CM | POA: Diagnosis present

## 2021-12-14 LAB — COMPREHENSIVE METABOLIC PANEL
ALT: 18 U/L (ref 0–44)
AST: 28 U/L (ref 15–41)
Albumin: 4.7 g/dL (ref 3.5–5.0)
Alkaline Phosphatase: 82 U/L (ref 38–126)
Anion gap: 7 (ref 5–15)
BUN: 10 mg/dL (ref 6–20)
CO2: 27 mmol/L (ref 22–32)
Calcium: 9.2 mg/dL (ref 8.9–10.3)
Chloride: 105 mmol/L (ref 98–111)
Creatinine, Ser: 0.99 mg/dL (ref 0.44–1.00)
GFR, Estimated: >60 mL/min (ref >60)
Glucose, Bld: 153 mg/dL — ABNORMAL HIGH (ref 70–99)
Potassium: 3.2 mmol/L — ABNORMAL LOW (ref 3.5–5.1)
Sodium: 139 mmol/L (ref 135–145)
Total Bilirubin: 1 mg/dL (ref 0.3–1.2)
Total Protein: 7.8 g/dL (ref 6.5–8.1)

## 2021-12-14 LAB — URINE DRUG SCREEN, QUALITATIVE (ARMC ONLY)
Amphetamines, Ur Screen: NOT DETECTED
Barbiturates, Ur Screen: NOT DETECTED
Benzodiazepine, Ur Scrn: NOT DETECTED
Cannabinoid 50 Ng, Ur ~~LOC~~: POSITIVE — AB
Cocaine Metabolite,Ur ~~LOC~~: NOT DETECTED
MDMA (Ecstasy)Ur Screen: NOT DETECTED
Methadone Scn, Ur: NOT DETECTED
Opiate, Ur Screen: NOT DETECTED
Phencyclidine (PCP) Ur S: NOT DETECTED
Tricyclic, Ur Screen: NOT DETECTED

## 2021-12-14 LAB — CBC
HCT: 45.6 % (ref 36.0–46.0)
Hemoglobin: 14.8 g/dL (ref 12.0–15.0)
MCH: 29.3 pg (ref 26.0–34.0)
MCHC: 32.5 g/dL (ref 30.0–36.0)
MCV: 90.3 fL (ref 80.0–100.0)
Platelets: 258 10*3/uL (ref 150–400)
RBC: 5.05 MIL/uL (ref 3.87–5.11)
RDW: 12.9 % (ref 11.5–15.5)
WBC: 10.4 10*3/uL (ref 4.0–10.5)
nRBC: 0 % (ref 0.0–0.2)

## 2021-12-14 LAB — ACETAMINOPHEN LEVEL: Acetaminophen (Tylenol), Serum: <10 ug/mL — ABNORMAL LOW (ref 10–30)

## 2021-12-14 LAB — ETHANOL: Alcohol, Ethyl (B): <10 mg/dL (ref <10)

## 2021-12-14 LAB — SALICYLATE LEVEL: Salicylate Lvl: <7 mg/dL — ABNORMAL LOW (ref 7.0–30.0)

## 2021-12-14 LAB — POC URINE PREG, ED: Preg Test, Ur: NEGATIVE

## 2021-12-14 NOTE — ED Triage Notes (Signed)
Pt ambulatory to triage. When asked why she is here, pt states " she can tell you, she made me come" and points to friend in room."  Friend reports pt told family member they would receive a letter tomorrow, so pt family called friend to check on her and friend found 3 letters indicating plans for SI. Letters provided to this nurse in triage. Will be left with pt chart. Pt reports she had plan to " take a bunch of pills."  Pt reluctant to answer questions in triage, but cooperative at this time.

## 2021-12-14 NOTE — ED Notes (Signed)
Pt dressed out into hospital scrubs with this nurse and Georgiann Hahn, EDT In room. Pt giving permission for friend to take jewelry with her.  Remaining belongings placed into hospital bag and labeled appropriately with pt information . Belongings include 1 green tshirt 1 pair of black pants 1 pair of brown sandals 1 pair underwear

## 2021-12-15 ENCOUNTER — Encounter (HOSPITAL_COMMUNITY): Payer: Self-pay | Admitting: Psychiatry

## 2021-12-15 ENCOUNTER — Inpatient Hospital Stay (HOSPITAL_COMMUNITY)
Admission: AD | Admit: 2021-12-15 | Discharge: 2021-12-22 | DRG: 885 | Disposition: A | Discharge status: Home / Self Care | Payer: BC Managed Care – PPO | Source: Intra-hospital | Attending: Psychiatry | Admitting: Psychiatry

## 2021-12-15 DIAGNOSIS — F329 Major depressive disorder, single episode, unspecified: Secondary | ICD-10-CM | POA: Diagnosis not present

## 2021-12-15 DIAGNOSIS — G40909 Epilepsy, unspecified, not intractable, without status epilepticus: Secondary | ICD-10-CM | POA: Diagnosis present

## 2021-12-15 DIAGNOSIS — F333 Major depressive disorder, recurrent, severe with psychotic symptoms: Principal | ICD-10-CM | POA: Diagnosis present

## 2021-12-15 DIAGNOSIS — R45851 Suicidal ideations: Secondary | ICD-10-CM | POA: Diagnosis present

## 2021-12-15 DIAGNOSIS — Z87898 Personal history of other specified conditions: Secondary | ICD-10-CM

## 2021-12-15 DIAGNOSIS — F331 Major depressive disorder, recurrent, moderate: Secondary | ICD-10-CM | POA: Diagnosis not present

## 2021-12-15 DIAGNOSIS — F411 Generalized anxiety disorder: Secondary | ICD-10-CM | POA: Diagnosis present

## 2021-12-15 DIAGNOSIS — F332 Major depressive disorder, recurrent severe without psychotic features: Secondary | ICD-10-CM | POA: Diagnosis not present

## 2021-12-15 DIAGNOSIS — F1290 Cannabis use, unspecified, uncomplicated: Secondary | ICD-10-CM

## 2021-12-15 DIAGNOSIS — Z79899 Other long term (current) drug therapy: Secondary | ICD-10-CM

## 2021-12-15 DIAGNOSIS — F902 Attention-deficit hyperactivity disorder, combined type: Secondary | ICD-10-CM | POA: Diagnosis present

## 2021-12-15 DIAGNOSIS — F321 Major depressive disorder, single episode, moderate: Secondary | ICD-10-CM | POA: Diagnosis not present

## 2021-12-15 LAB — RESP PANEL BY RT-PCR (FLU A&B, COVID) ARPGX2
Influenza A by PCR: NEGATIVE
Influenza B by PCR: NEGATIVE
SARS Coronavirus 2 by RT PCR: NEGATIVE

## 2021-12-15 MED ORDER — ACETAMINOPHEN 325 MG PO TABS
650.0000 mg | ORAL_TABLET | Freq: Four times a day (QID) | ORAL | Status: DC | PRN
  Administered 2021-12-22: 650 mg via ORAL
  Filled 2021-12-15: qty 2

## 2021-12-15 MED ORDER — TRAZODONE HCL 50 MG PO TABS
50.0000 mg | ORAL_TABLET | Freq: Every evening | ORAL | Status: DC | PRN
  Administered 2021-12-20 – 2021-12-21 (×2): 50 mg via ORAL
  Filled 2021-12-15 (×2): qty 1

## 2021-12-15 MED ORDER — ALUM & MAG HYDROXIDE-SIMETH 200-200-20 MG/5ML PO SUSP: 30.0000 mL | ORAL | Status: DC | PRN

## 2021-12-15 MED ORDER — HYDROXYZINE HCL 25 MG PO TABS
25.0000 mg | ORAL_TABLET | Freq: Three times a day (TID) | ORAL | Status: DC | PRN
  Administered 2021-12-15 – 2021-12-21 (×7): 25 mg via ORAL
  Filled 2021-12-15 (×8): qty 1

## 2021-12-15 MED ORDER — MAGNESIUM HYDROXIDE 400 MG/5ML PO SUSP: 30.0000 mL | Freq: Every day | ORAL | Status: DC | PRN

## 2021-12-15 NOTE — Tx Team (Signed)
Initial Treatment Plan 12/15/2021 5:58 PM [name] [id number]    PATIENT STRESSORS: Financial difficulties   Legal issue   Marital or family conflict   Occupational concerns     PATIENT STRENGTHS: Ability for insight  Printmaker for treatment/growth    PATIENT IDENTIFIED PROBLEMS: Anxiety  Anger  Suicidal ideation  Panic attack  Restlessness  Appetite decrease  Crying spell  Insomnia   Irritability     DISCHARGE CRITERIA:  Improved stabilization in mood, thinking, and/or behavior Verbal commitment to aftercare and medication compliance  PRELIMINARY DISCHARGE PLAN: Return to previous living arrangement Return to previous work or school arrangements  PATIENT/FAMILY INVOLVEMENT: This treatment plan has been presented to and reviewed with the patient, Melinda Mills,  The patient has been given the opportunity to ask questions and make suggestions.  Melvenia Needles, RN 12/15/2021, 5:58 PM

## 2021-12-15 NOTE — ED Notes (Signed)
Pt medically cleared by Vicente Males MD

## 2021-12-15 NOTE — Consult Note (Addendum)
[facility] Face-to-Face Psychiatry Consult   Reason for Consult: Psychiatric Evaluation Referring Physician: Dr. Vicente Males Patient Identification: Melinda Mills MRN:  [id number] Principal Diagnosis: <principal problem not specified> Diagnosis:  Active Problems:   Family history of Huntington's disease   Panic attack   History of seizure   MDD (major depressive disorder)   GAD (generalized anxiety disorder)   MDD (major depressive disorder), single episode with postpartum onset   Insomnia due to mental condition   ADHD (attention deficit hyperactivity disorder), combined type   RLS (restless legs syndrome)   Adjustment disorder with mixed anxiety and depressed mood   Fatigue   Total Time spent with patient: 1 hour  Subjective: "I left a note to my family because I was going to end it." Melinda Mills is a 31 y.o. female patient presented to [facility] ED via POV is voluntary and is placed under involuntary commitment status (IVC) by this provider. The patient shared that she has always suffered from depression, anxiety, and ADHD. She stated that she felt overwhelmed about her life and relationships. She wrote her family letters and told them they would receive them tomorrow. The patient indicated she planned to overdose on pills. The patient shared that she does have an outpatient psychiatrist and therapist. She is on Buspar 10 mg bid and Lexapro  20 mg daily, and an ADHD medication. The patient stated she is separated from her husband, who has a four-year-old son. 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   This provider saw The patient face-to-face; the chart was reviewed, and consulted with Dr.Bradler on 12/14/2021 due to the patient's care. It was discussed with the EDP that the patient does meet the criteria to be admitted to the psychiatric inpatient unit.  On evaluation, the patient is alert and oriented x 4, calm, cooperative, and mood-congruent with affect. The patient does not  appear to be responding to internal or external stimuli. Neither is the patient presenting with any delusional thinking. The patient denies auditory or visual hallucinations. The patient admits to suicidal ideation but denies homicidal or self-harm ideations. The patient is not presenting with any psychotic or paranoid behaviors. During an encounter with the patient, she could answer questions appropriately.  HPI: Per Dr. Vicente Males, Melinda Mills is a 31 y.o. female presents with friend at bedside stating that she is concerned that patient has suicidal ideation.  Per friend at bedside patient has written 3 letters indicating plans for suicide including plan to "take a bunch of pills".  Patient is reluctant to answer questions at this time but is cooperative and currently denies any homicidal ideation or auditory/visual hallucinations.  Does not endorse history of major depressive disorder and states that she has been taking her medications on time and as prescribed ROS: Patient currently denies any vision changes, tinnitus, difficulty speaking, facial droop, sore throat, chest pain, shortness of breath, abdominal pain, nausea/vomiting/diarrhea, dysuria, or weakness/numbness/paresthesias in any extremity  Past Psychiatric History:  ADHD (attention deficit hyperactivity disorder) Depression Seizures (HCC)   Risk to Self:   Risk to Others:   Prior Inpatient Therapy:   Prior Outpatient Therapy:    Past Medical History:  Past Medical History:  Diagnosis Date   ADHD (attention deficit hyperactivity disorder)    Depression    Seizures (HCC)     Past Surgical History:  Procedure Laterality Date   APPENDECTOMY  2008/2009   Family History:  Family History  Adopted: Yes   Family Psychiatric  History:  Social History:  Social History   Substance and Sexual Activity  Alcohol Use Not Currently     Social History   Substance and Sexual Activity  Drug Use Not Currently   Types: Marijuana     Social History   Socioeconomic History   Marital status: Legally Separated    Spouse name: Not on file   Number of children: 1   Years of education: Not on file   Highest education level: Bachelor's degree (e.g., BA, AB, BS)  Occupational History   Not on file  Tobacco Use   Smoking status: Never   Smokeless tobacco: Never  Vaping Use   Vaping Use: Never used  Substance and Sexual Activity   Alcohol use: Not Currently   Drug use: Not Currently    Types: Marijuana   Sexual activity: Yes  Other Topics Concern   Not on file  Social History Narrative   Not on file   Social Determinants of Health   Financial Resource Strain: Low Risk  (08/15/2018)   Overall Financial Resource Strain (CARDIA)    Difficulty of Paying Living Expenses: Not very hard  Food Insecurity: No Food Insecurity (02/09/2018)   Hunger Vital Sign    Worried About Running Out of Food in the Last Year: Never true    Ran Out of Food in the Last Year: Never true  Transportation Needs: No Transportation Needs (02/09/2018)   PRAPARE - Administrator, Civil ServiceTransportation    Lack of Transportation (Medical): No    Lack of Transportation (Non-Medical): No  Physical Activity: Inactive (02/09/2018)   Exercise Vital Sign    Days of Exercise per Week: 0 days    Minutes of Exercise per Session: 0 min  Stress: No Stress Concern Present (02/09/2018)   [employer] - Occupational Stress Questionnaire    Feeling of Stress : Not at all  Social Connections: Unknown (08/15/2018)   Social Connection and Isolation Panel [NHANES]    Frequency of Communication with Friends and Family: Not on file    Frequency of Social Gatherings with Friends and Family: Not on file    Attends Religious Services: Not on file    Active Member of Clubs or Organizations: Not on file    Attends BankerClub or Organization Meetings: Not on file    Marital Status: Living with partner   Additional Social History:    Allergies:  No Known  Allergies  Labs:  Results for orders placed or performed during the hospital encounter of 12/14/21 (from the past 48 hour(s))  Comprehensive metabolic panel     Status: Abnormal   Collection Time: 12/14/21  8:00 PM  Result Value Ref Range   Sodium 139 135 - 145 mmol/L   Potassium 3.2 (L) 3.5 - 5.1 mmol/L   Chloride 105 98 - 111 mmol/L   CO2 27 22 - 32 mmol/L   Glucose, Bld 153 (H) 70 - 99 mg/dL    Comment: Glucose reference range applies only to samples taken after fasting for at least 8 hours.   BUN 10 6 - 20 mg/dL   Creatinine, Ser 8.290.99 0.44 - 1.00 mg/dL   Calcium 9.2 8.9 - 56.210.3 mg/dL   Total Protein 7.8 6.5 - 8.1 g/dL   Albumin 4.7 3.5 - 5.0 g/dL   AST 28 15 - 41 U/L   ALT 18 0 - 44 U/L   Alkaline Phosphatase 82 38 - 126 U/L   Total Bilirubin 1.0 0.3 - 1.2 mg/dL   GFR, Estimated >13>60 >  60 mL/min    Comment: (NOTE) Calculated using the CKD-EPI Creatinine Equation (2021)    Anion gap 7 5 - 15    Comment: Performed at [facility], [address] [place], [place] [address]  Ethanol     Status: None   Collection Time: 12/14/21  8:00 PM  Result Value Ref Range   Alcohol, Ethyl (B) <10 <10 mg/dL    Comment: (NOTE) Lowest detectable limit for serum alcohol is 10 mg/dL.  For medical purposes only. Performed at [facility], [address] [place], [place] [address]   Salicylate level     Status: Abnormal   Collection Time: 12/14/21  8:00 PM  Result Value Ref Range   Salicylate Lvl <7.0 (L) 7.0 - 30.0 mg/dL    Comment: Performed at [facility], [address] [place], [place] [address]  Acetaminophen level     Status: Abnormal   Collection Time: 12/14/21  8:00 PM  Result Value Ref Range   Acetaminophen (Tylenol), Serum <10 (L) 10 - 30 ug/mL    Comment: (NOTE) Therapeutic concentrations vary significantly. A range of 10-30 ug/mL  may be an effective concentration for many patients. However, some  are best treated at concentrations outside of  this range. Acetaminophen concentrations >150 ug/mL at 4 hours after ingestion  and >50 ug/mL at 12 hours after ingestion are often associated with  toxic reactions.  Performed at [facility], [address] [place], [place] [address]   cbc     Status: None   Collection Time: 12/14/21  8:00 PM  Result Value Ref Range   WBC 10.4 4.0 - 10.5 K/uL   RBC 5.05 3.87 - 5.11 MIL/uL   Hemoglobin 14.8 12.0 - 15.0 g/dL   HCT 18.8 41.6 - 60.6 %   MCV 90.3 80.0 - 100.0 fL   MCH 29.3 26.0 - 34.0 pg   MCHC 32.5 30.0 - 36.0 g/dL   RDW 30.1 60.1 - 09.3 %   Platelets 258 150 - 400 K/uL   nRBC 0.0 0.0 - 0.2 %    Comment: Performed at [facility], [address] [place], [place] [address]  Urine Drug Screen, Qualitative     Status: Abnormal   Collection Time: 12/14/21  8:00 PM  Result Value Ref Range   Tricyclic, Ur Screen NONE DETECTED NONE DETECTED   Amphetamines, Ur Screen NONE DETECTED NONE DETECTED   MDMA (Ecstasy)Ur Screen NONE DETECTED NONE DETECTED   Cocaine Metabolite,Ur [place] NONE DETECTED NONE DETECTED   Opiate, Ur Screen NONE DETECTED NONE DETECTED   Phencyclidine (PCP) Ur S NONE DETECTED NONE DETECTED   Cannabinoid 50 Ng, Ur [place] POSITIVE (A) NONE DETECTED   Barbiturates, Ur Screen NONE DETECTED NONE DETECTED   Benzodiazepine, Ur Scrn NONE DETECTED NONE DETECTED   Methadone Scn, Ur NONE DETECTED NONE DETECTED    Comment: (NOTE) Tricyclics + metabolites, urine    Cutoff 1000 ng/mL Amphetamines + metabolites, urine  Cutoff 1000 ng/mL MDMA (Ecstasy), urine              Cutoff 500 ng/mL Cocaine Metabolite, urine          Cutoff 300 ng/mL Opiate + metabolites, urine        Cutoff 300 ng/mL Phencyclidine (PCP), urine         Cutoff 25 ng/mL Cannabinoid, urine                 Cutoff 50 ng/mL Barbiturates + metabolites, urine  Cutoff 200 ng/mL Benzodiazepine, urine              Cutoff 200 ng/mL Methadone, urine                   Cutoff 300 ng/mL  The urine drug  screen provides only a preliminary, unconfirmed analytical test result and should not be used for non-medical purposes. Clinical consideration and professional judgment should be applied to any positive drug screen result due to possible interfering substances. A more specific alternate chemical method must be used in order to obtain a confirmed analytical result. Gas chromatography / mass spectrometry (GC/MS) is the preferred confirm atory method. Performed at [facility], [address] [place], [place] [address]   Resp Panel by RT-PCR (Flu A&B, Covid) Anterior Nasal Swab     Status: None   Collection Time: 12/14/21  8:02 PM   Specimen: Anterior Nasal Swab  Result Value Ref Range   SARS Coronavirus 2 by RT PCR NEGATIVE NEGATIVE    Comment: (NOTE) SARS-CoV-2 target nucleic acids are NOT DETECTED.  The SARS-CoV-2 RNA is generally detectable in upper respiratory specimens during the acute phase of infection. The lowest concentration of SARS-CoV-2 viral copies this assay can detect is 138 copies/mL. A negative result does not preclude SARS-Cov-2 infection and should not be used as the sole basis for treatment or other patient management decisions. A negative result may occur with  improper specimen collection/handling, submission of specimen other than nasopharyngeal swab, presence of viral mutation(s) within the areas targeted by this assay, and inadequate number of viral copies(<138 copies/mL). A negative result must be combined with clinical observations, patient history, and epidemiological information. The expected result is Negative.  Fact Sheet for Patients:  BloggerCourse.com  Fact Sheet for Healthcare Providers:  SeriousBroker.it  This test is no t yet approved or cleared by the [place] FDA and  has been authorized for detection and/or diagnosis of SARS-CoV-2 by FDA under an Emergency Use Authorization  (EUA). This EUA will remain  in effect (meaning this test can be used) for the duration of the COVID-19 declaration under Section 564(b)(1) of the Act, 21 U.S.C.section 360bbb-3(b)(1), unless the authorization is terminated  or revoked sooner.       Influenza A by PCR NEGATIVE NEGATIVE   Influenza B by PCR NEGATIVE NEGATIVE    Comment: (NOTE) The Xpert Xpress SARS-CoV-2/FLU/RSV plus assay is intended as an aid in the diagnosis of influenza from Nasopharyngeal swab specimens and should not be used as a sole basis for treatment. Nasal washings and aspirates are unacceptable for Xpert Xpress SARS-CoV-2/FLU/RSV testing.  Fact Sheet for Patients: BloggerCourse.com  Fact Sheet for Healthcare Providers: SeriousBroker.it  This test is not yet approved or cleared by the [place] FDA and has been authorized for detection and/or diagnosis of SARS-CoV-2 by FDA under an Emergency Use Authorization (EUA). This EUA will remain in effect (meaning this test can be used) for the duration of the COVID-19 declaration under Section 564(b)(1) of the Act, 21 U.S.C. section 360bbb-3(b)(1), unless the authorization is terminated or revoked.  Performed at [facility], [address] [place], [place] [address]   POC urine preg, ED     Status: None   Collection Time: 12/14/21  8:07 PM  Result Value Ref Range   Preg Test, Ur NEGATIVE NEGATIVE    Comment:        THE SENSITIVITY OF THIS METHODOLOGY IS >24 mIU/mL     No current  facility-administered medications for this encounter.   Current Outpatient Medications  Medication Sig Dispense Refill   hydrOXYzine (ATARAX) 25 MG tablet TAKE 1/2-1 TABLETS (12.5-25 MG TOTAL) BY MOUTH 2 (TWO) TIMES DAILY AS NEEDED. 180 tablet 1   BALZIVA 0.4-35 MG-MCG tablet Take 1 tablet by mouth daily. (Patient not taking: Reported on 12/14/2021)     busPIRone (BUSPAR) 10 MG tablet Take 1 tablet (10 mg  total) by mouth 2 (two) times daily. (Patient not taking: Reported on 12/14/2021) 180 tablet 0   methylphenidate (METADATE CD) 40 MG CR capsule Take 40 mg by mouth daily. (Patient not taking: Reported on 11/03/2021)     MYDAYIS 37.5 MG CP24 Take 1 capsule by mouth daily. (Patient not taking: Reported on 12/14/2021)      Musculoskeletal: Strength & Muscle Tone: within normal limits Gait & Station: normal Patient leans: N/A  Psychiatric Specialty Exam:  Presentation  General Appearance: Appropriate for Environment; Well Groomed  Eye Contact:Good  Speech:Clear and Coherent  Speech Volume:Normal  Handedness:Right   Mood and Affect  Mood:Depressed  Affect:Congruent   Thought Process  Thought Processes:Coherent  Descriptions of Associations:Intact  Orientation:Full (Time, Place and Person)  Thought Content:Logical  History of Schizophrenia/Schizoaffective disorder:No data recorded Duration of Psychotic Symptoms:No data recorded Hallucinations:Hallucinations: None  Ideas of Reference:None  Suicidal Thoughts:Suicidal Thoughts: Yes, Active SI Active Intent and/or Plan: With Intent; With Plan; With Means to Carry Out; With Access to Means  Homicidal Thoughts:Homicidal Thoughts: No   Sensorium  Memory:Immediate Good  Judgment:Poor  Insight:Poor   Executive Functions  Concentration:Fair  Attention Span:Good  Recall:Good  Fund of Knowledge:Good  Language:Good   Psychomotor Activity  Psychomotor Activity:Psychomotor Activity: Normal   Assets  Assets:Communication Skills; Desire for Improvement; Leisure Time; Physical Health; Social Support; Resilience   Sleep  Sleep:Sleep: Poor   Physical Exam: Physical Exam Vitals and nursing note reviewed.  Constitutional:      Appearance: Normal appearance. She is normal weight.  HENT:     Head: Normocephalic and atraumatic.     Right Ear: External ear normal.     Left Ear: External ear normal.     Nose:  Nose normal.     Mouth/Throat:     Mouth: Mucous membranes are moist.  Cardiovascular:     Rate and Rhythm: Tachycardia present.     Pulses: Normal pulses.  Pulmonary:     Effort: Pulmonary effort is normal.  Musculoskeletal:        General: Normal range of motion.     Cervical back: Normal range of motion and neck supple.  Neurological:     Mental Status: She is alert and oriented to person, place, and time.  Psychiatric:        Attention and Perception: Attention and perception normal.        Mood and Affect: Mood is anxious and depressed. Affect is inappropriate.        Behavior: Behavior is cooperative.        Thought Content: Thought content includes suicidal ideation. Thought content includes suicidal plan.        Cognition and Memory: Cognition and memory normal.    Review of Systems  Psychiatric/Behavioral:  Positive for depression, substance abuse and suicidal ideas. The patient has insomnia.    Blood pressure 137/79, pulse (!) 103, temperature 98.4 F (36.9 C), temperature source Oral, resp. rate 18, height 5\' 3"  (1.6 m), weight 77.1 kg, SpO2 99 %. Body mass index is 30.11 kg/m.  Treatment Plan Summary: Medication  management and Plan Patient does meet the criteria for psychiatric inpatient admission.  Disposition: Recommend psychiatric Inpatient admission when medically cleared. Supportive therapy provided about ongoing stressors.  Gillermo Murdoch, NP 12/15/2021 1:19 AM

## 2021-12-15 NOTE — BH Assessment (Signed)
Patient has been accepted to [facility].  Patient assigned to room 406-2 Accepting physician is Dr. Jeannene Patella.  Call report to [phone].   ER Staff is aware of it:  Misty Stanley, ER Secretary  Dr. Roxan Hockey, ER MD  Annette Stable, Patient's Nurse     Patient's Family/Support System [facility] [phone]) have been updated as well.

## 2021-12-15 NOTE — ED Notes (Signed)
IVC/Consult ordered ?

## 2021-12-15 NOTE — Progress Notes (Signed)
The patient rated her day as a 5 out of 10 since today is her first day in the hospital. Her positive event for the day is that she waited and received her personal clothing.

## 2021-12-15 NOTE — Plan of Care (Signed)
  Problem: Coping: Goal: Ability to verbalize frustrations and anger appropriately will improve Outcome: Progressing Goal: Ability to demonstrate self-control will improve Outcome: Progressing   Problem: Safety: Goal: Periods of time without injury will increase Outcome: Progressing   

## 2021-12-15 NOTE — ED Notes (Signed)
[place]  [employer]  [place]  CALLED  FOR  TRANSPORT  TO [facility] ?

## 2021-12-15 NOTE — ED Notes (Signed)
Breakfast tray and beverage provided 

## 2021-12-15 NOTE — Progress Notes (Signed)
Admission note: Patient is a 31 year-old female admitted from [facility] ED, under IVC status for suicidal ideation, with a plan to overdose on her prescribed medication. Patient arrived the [facility] unit with police escort, awake, alert and oriented X's 4. Patient states she lives alone, but her son visits her every other week. Patient reports going through a legal divorce at the moment. 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 Patient termed this as "my childhood crap."  Pt is oriented to the unit, room and routine. Information packet given to patient and safety information discussed with patient.  Admission INP armband ID verified with patient, and in place, fall risk assessment completed with Patient and she verbalized understanding of risks associated with falls. No contraband found during skin assessment, Skin, clean-dry- intact with evidence of scratch marks noted on arms and legs. Tattoo noted on left hand and foot.  No acute distress noted at this time. Q 15 minutes safety observation in place. Staff will continue to provide support and reassurance to patient.

## 2021-12-15 NOTE — ED Provider Notes (Signed)
[facility] Provider Note   Event Date/Time   First MD Initiated Contact with Patient 12/14/21 2154     (approximate) History  Psychiatric Evaluation  HPI Melinda Mills is a 31 y.o. female presents with friend at bedside stating that she is concerned that patient has suicidal ideation.  Per friend at bedside patient has written 3 letters indicating plans for suicide including plan to "take a bunch of pills".  Patient is reluctant to answer questions at this time but is cooperative and currently denies any homicidal ideation or auditory/visual hallucinations.  Does not endorse history of major depressive disorder and states that she has been taking her medications on time and as prescribed ROS: Patient currently denies any vision changes, tinnitus, difficulty speaking, facial droop, sore throat, chest pain, shortness of breath, abdominal pain, nausea/vomiting/diarrhea, dysuria, or weakness/numbness/paresthesias in any extremity   Physical Exam  Triage Vital Signs: ED Triage Vitals [12/14/21 1956]  Enc Vitals Group     BP 137/79     Pulse Rate (!) 103     Resp 18     Temp 98.4 F (36.9 C)     Temp Source Oral     SpO2 99 %     Weight 170 lb (77.1 kg)     Height 5\' 3"  (1.6 m)     Head Circumference      Peak Flow      Pain Score 0     Pain Loc      Pain Edu?      Excl. in GC?    Most recent vital signs: Vitals:   12/14/21 1956  BP: 137/79  Pulse: (!) 103  Resp: 18  Temp: 98.4 F (36.9 C)  SpO2: 99%   General: Awake, oriented x4. CV:  Good peripheral perfusion.  Resp:  Normal effort.  Abd:  No distention.  Other:  Middle-aged overweight Caucasian female laying in bed in no acute distress ED Results / Procedures / Treatments  Labs (all labs ordered are listed, but only abnormal results are displayed) Labs Reviewed  COMPREHENSIVE METABOLIC PANEL - Abnormal; Notable for the following components:      Result Value   Potassium 3.2 (*)    Glucose,  Bld 153 (*)    All other components within normal limits  SALICYLATE LEVEL - Abnormal; Notable for the following components:   Salicylate Lvl <7.0 (*)    All other components within normal limits  ACETAMINOPHEN LEVEL - Abnormal; Notable for the following components:   Acetaminophen (Tylenol), Serum <10 (*)    All other components within normal limits  URINE DRUG SCREEN, QUALITATIVE ([facility] ONLY) - Abnormal; Notable for the following components:   Cannabinoid 50 Ng, Ur [place] POSITIVE (*)    All other components within normal limits  RESP PANEL BY RT-PCR (FLU A&B, COVID) ARPGX2  ETHANOL  CBC  POC URINE PREG, ED  POC URINE PREG, ED  PROCEDURES: Critical Care performed: No Procedures MEDICATIONS ORDERED IN ED: Medications - No data to display IMPRESSION / MDM / ASSESSMENT AND PLAN / ED COURSE  I reviewed the triage vital signs and the nursing notes.                              Patient's presentation is most consistent with acute presentation with potential threat to life or bodily function. Thoughts are linear and organized, and patient has no AH, VH, or HI. Prior suicide  attempt: Denies Prior Psychiatric Hospitalizations: Denies  Clinically patient displays no overt toxidrome; they are well appearing, with low suspicion for toxic ingestion given history and exam. Thoughts unlikely 2/2 anemia, hypothyroidism, infection, or ICH.  Consult: Psychiatry to evaluate patient for potential hold for danger to self. Disposition: Plan admit to psychiatry for further management of symptoms.     FINAL CLINICAL IMPRESSION(S) / ED DIAGNOSES   Final diagnoses:  Suicidal ideation   Rx / DC Orders   ED Discharge Orders     None      Note:  This document was prepared using Dragon voice recognition software and may include unintentional dictation errors.   Merwyn Katos, MD 12/15/21 [phone]

## 2021-12-15 NOTE — BH Assessment (Signed)
Comprehensive Clinical Assessment (CCA) Note  12/15/2021 Melinda Mills [id number] Recommendations for Services/Supports/Treatments: Consulted with Lynder Parents., NP, who determined pt. meets inpatient psychiatric criteria. Notified Dr. Joni Fears and Deneise Lever, RN of disposition recommendation.   Melinda Mills is a 31 year old., Caucasian, Non-Hispanic, English speaking female with a psych hx of MDD, GAD, and adjustment disorder. Pt also has a hx of ADHD. Pt was initially voluntary but has been IVC'd by EDP. Per triage note When asked why she is here, pt states " she can tell you, she made me come" and points to friend in room."  Friend reports pt told family member they would receive a letter tomorrow, so pt family called friend to check on her and friend found 3 letters indicating plans for SI. Upon assessment, Pt was forthcoming about having worsening SI that stems from an argument with the guy she is currently dating. Pt stated, "I was going to kill myself. Planning on it. I have ideations on and off. Today it got worse". Pt admitted to writing suicide letters to her family, current female friend, and her estranged husband. The pt. would laugh inappropriately when discussing her suicide letters and ideations.  When asked about a plan the pt stated she'd planned to overdose of pills. Pt is connected to a psychiatrist and therapist. Pt reported that she has not taking her prescribed psych medications correctly, explaining that she forgets some of them. Pt explained that she passive SI with a vague plan to overdose. Pt had dangerous judgement and lacking insight. Pt had distorted reality testing. Pt was A & O x4. Pt had an inappropriate mood and an incongruent affect. Pt had a somewhat silly attitude about it all. Pt had normal speech and fleeting eye contact. Pt had normal psychomotor activity. Pt did not appear to be responding to internal stimuli nor did she present with any delusional thinking. Pt denied current  SI/HI/AV/H. Pt's UDS/BAL were unremarkable.  Chief Complaint:  Chief Complaint  Patient presents with   Psychiatric Evaluation   Visit Diagnosis: Family history of Huntington's disease   Panic attack   History of seizure   MDD (major depressive disorder)   GAD (generalized anxiety disorder)   MDD (major depressive disorder), single episode with postpartum onset   Insomnia due to mental condition   ADHD (attention deficit hyperactivity disorder), combined type   RLS (restless legs syndrome)   Adjustment disorder with mixed anxiety and depressed mood   Fatigue        CCA Screening, Triage and Referral (STR)  Patient Reported Information How did you hear about [place]? Family/Friend  Referral name: No data recorded Referral phone number: No data recorded  Whom do you see for routine medical problems? No data recorded Practice/Facility Name: No data recorded Practice/Facility Phone Number: No data recorded Name of Contact: No data recorded Contact Number: No data recorded Contact Fax Number: No data recorded Prescriber Name: No data recorded Prescriber Address (if known): No data recorded  What Is the Reason for Your Visit/Call Today? Pt ambulatory to triage. When asked why she is here, pt states " she can tell you, she made me come" and points to friend in room."   Friend reports pt told family member they would receive a letter tomorrow, so pt family called friend to check on her and friend found 3 letters indicating plans for SI.  How Long Has This Been Causing You Problems? <Week  What Do You Feel Would Help You the Most Today? Treatment for Depression  or other mood problem; Stress Management   Have You Recently Been in Any Inpatient Treatment (Hospital/Detox/Crisis Center/28-Day Program)? No data recorded Name/Location of Program/Hospital:No data recorded How Long Were You There? No data recorded When Were You Discharged? No data recorded  Have You Ever Received  Services From [facility] Before? No data recorded Who Do You See at [facility]? No data recorded  Have You Recently Had Any Thoughts About Hurting Yourself? Yes  Are You Planning to Commit Suicide/Harm Yourself At This time? No   Have you Recently Had Thoughts About [place]? No  Explanation: No data recorded  Have You Used Any Alcohol or Drugs in the Past 24 Hours? No  How Long Ago Did You Use Drugs or Alcohol? No data recorded What Did You Use and How Much? No data recorded  Do You Currently Have a Therapist/Psychiatrist? Yes  Name of Therapist/Psychiatrist: Dr. [name] (Psychiatrist) and [name] (Counselor) of [place] Recently Discharged From Any Office Practice or Programs? No  Explanation of Discharge From Practice/Program: No data recorded    CCA Screening Triage Referral Assessment Type of Contact: Face-to-Face  Is this Initial or Reassessment? No data recorded Date Telepsych consult ordered in [facility]:  No data recorded Time Telepsych consult ordered in [facility]:  No data recorded  Patient Reported Information Reviewed? No data recorded Patient Left Without Being Seen? No data recorded Reason for Not Completing Assessment: No data recorded  Collateral Involvement: None provided   Does Patient Have a [place]? No data recorded Name and Contact of Legal Guardian: No data recorded If Minor and Not Living with Parent(s), Who has Custody? n/a  Is CPS involved or ever been involved? Never  Is APS involved or ever been involved? Never   Patient Determined To Be At Risk for Harm To Self or Others Based on Review of Patient Reported Information or Presenting Complaint? Yes, for Self-Harm  Method: No data recorded Availability of Means: No data recorded Intent: No data recorded Notification Required: No data recorded Additional Information for Danger to Others Potential: No data recorded Additional Comments for Danger to  Others Potential: No data recorded Are There Guns or Other Weapons in Your Home? No data recorded Types of Guns/Weapons: No data recorded Are These Weapons Safely Secured?                            No data recorded Who Could Verify You Are Able To Have These Secured: No data recorded Do You Have any Outstanding Charges, Pending Court Dates, Parole/Probation? No data recorded Contacted To Inform of Risk of Harm To Self or Others: No data recorded  Location of Assessment: [facility] ED   Does Patient Present under Involuntary Commitment? Yes  IVC Papers Initial File Date: 12/14/21   [place] of Residence: [place]   Patient Currently Receiving the Following Services: Individual Therapy; Medication Management   Determination of Need: Emergent (2 hours)   Options For Referral: Inpatient Hospitalization     CCA Biopsychosocial Intake/Chief Complaint:  Pt presents as a 31 year old, married Caucasian female for assessment. Pt was referred by her psychiatrist and is seeking counseling for anxiety and depression. Pt reported "I originally started therapy due to postpartum anxiety. We discovered that I probably always had anxiety" which was exacerbated after birth of her child 2 years ago. Pt reported she and spouse are currently "in a rut" and provided examples of stressors  around household obligations, roles, and finances. Pt reported wanting to work on better communication w/ spouse.  Current Symptoms/Problems: Anxiety, Depression, Relationship Issues/Communication, Familial/work obligations   Patient Reported Schizophrenia/Schizoaffective Diagnosis in Past: No   Strengths: Pt is employed, has stable housing, pt has supportive family/friends  Preferences: Pt reported it was helpful to have someone to talk to.  Abilities: Pt able to communicate needs and feelings clearly throughout assessment. Pt working full-time, raising a 29 year-old and engaging in hobbies.   Type of Services  Patient Feels are Needed: Individual Therapy and Medication Management   Initial Clinical Notes/Concerns: N/A   Mental Health Symptoms Depression:   Worthlessness; Hopelessness; Fatigue   Duration of Depressive symptoms:  Greater than two weeks   Mania:   N/A   Anxiety:    Worrying; Difficulty concentrating; Irritability; Fatigue; Tension; Restlessness   Psychosis:   None   Duration of Psychotic symptoms: No data recorded  Trauma:   N/A   Obsessions:   Cause anxiety; Recurrent & persistent thoughts/impulses/images; Poor insight; Disrupts routine/functioning; Intrusive/time consuming   Compulsions:   N/A   Inattention:   None   Hyperactivity/Impulsivity:   None   Oppositional/Defiant Behaviors:   N/A   Emotional Irregularity:   Recurrent suicidal behaviors/gestures/threats; Unstable self-image; Mood lability; Intense/unstable relationships; Frantic efforts to avoid abandonment   Other Mood/Personality Symptoms:  No data recorded   Mental Status Exam Appearance and self-care  Stature:   Average   Weight:   Average weight   Clothing:   -- (In scrubs)   Grooming:   Well-groomed   Cosmetic use:   None   Posture/gait:   Normal   Motor activity:   Not Remarkable   Sensorium  Attention:   Normal   Concentration:   Normal   Orientation:   Situation; Place; Person; Object   Recall/memory:   Normal   Affect and Mood  Affect:   Not Congruent; Inappropriate   Mood:   Dysphoric   Relating  Eye contact:   Normal   Facial expression:   Anxious   Attitude toward examiner:   Cooperative; Presenter, broadcasting and Language  Speech flow:  Normal   Thought content:   Appropriate to Mood and Circumstances   Preoccupation:   Ruminations   Hallucinations:   None   Organization:  No data recorded  [employer] of Knowledge:   Average   Intelligence:   Average   Abstraction:   Normal   Judgement:   Poor    Reality Testing:   Distorted   Insight:   Lacking   Decision Making:   Vacilates   Social Functioning  Social Maturity:   Self-centered   Social Judgement:   Impropriety   Stress  Stressors:   Work; Surveyor, quantity; Relationship   Coping Ability:   Overwhelmed   Skill Deficits:   Interpersonal; Decision making   Supports:   Friends/Service system; Family     Religion: Religion/Spirituality Are You A Religious Person?: No How Might This Affect Treatment?: N/A  Leisure/Recreation: Leisure / Recreation Do You Have Hobbies?: Yes Leisure and Hobbies: 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 Patient's Job has Been Impacted by Current Illness: No Has Patient ever Been in the 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 Pt reported having an argument that led to her worsening SI. Does patient have children?: Yes How many children?: 1 How is patient's relationship with their children?: Pt has a 42-year old son and she shares custody with her estranged husband.  Childhood History:  Childhood History By whom was/is the patient raised?: Mother/father and step-parent Did patient suffer any verbal/emotional/physical/sexual abuse as a child?: No Did patient suffer from severe childhood neglect?: No Has patient ever been sexually abused/assaulted/raped as an adolescent or adult?: No Was the patient ever a victim of a crime or a disaster?: No Witnessed domestic violence?: No Has patient been affected by domestic violence as an adult?: No  Child/Adolescent Assessment:     CCA Substance Use Alcohol/Drug Use: Alcohol / Drug Use Pain Medications: SEE MAR Prescriptions: SEE MAR Over the Counter: SEE MAR History of alcohol / drug use?: No history of alcohol / drug abuse                         ASAM's:  Six Dimensions of Multidimensional Assessment  Dimension 1:  Acute Intoxication and/or Withdrawal Potential:      Dimension 2:  Biomedical Conditions and Complications:      Dimension 3:  Emotional, Behavioral, or Cognitive Conditions and Complications:     Dimension 4:  Readiness to Change:     Dimension 5:  Relapse, Continued use, or Continued Problem Potential:     Dimension 6:  Recovery/Living Environment:     ASAM Severity Score:    ASAM Recommended Level of Treatment:     Substance use Disorder (SUD)    Recommendations for Services/Supports/Treatments:    DSM5 Diagnoses: Patient Active Problem List   Diagnosis Date Noted   Fatigue 02/11/2021   Adjustment disorder with mixed anxiety and depressed mood 01/27/2021   MDD (major depressive disorder), recurrent, in full remission  (HCC) 12/20/2019   MDD (major depressive disorder), single episode, in partial remission (HCC) 07/13/2019   RLS (restless legs syndrome) 02/27/2019   ADHD (attention deficit hyperactivity disorder), combined type 01/10/2019   MDD (major depressive disorder), single episode with postpartum onset 11/23/2018   Insomnia due to mental condition 11/23/2018   MDD (major depressive disorder) 10/05/2018   GAD (generalized anxiety disorder) 10/05/2018   History of seizure 08/15/2018   Labor and delivery indication for care or intervention 02/09/2018   Congenital renal agenesis, unilateral    Family history of Huntington's disease    Ovary absent    High-risk pregnancy  07/28/2017   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

## 2021-12-15 NOTE — Group Note (Signed)
Recreation Therapy Group Note   Group Topic:Animal Assisted Therapy   Group Date: 12/15/2021 Start Time: 1430 End Time: 1510 Facilitators: Caroll Rancher, LRT,CTRS Location: [address] Dayroom   Animal-Assisted Activity (AAA) Program Checklist/Progress Notes Patient Eligibility Criteria Checklist & Daily Group note for Rec Tx Intervention  AAA/T Program Assumption of Risk Form signed by Patient/ or Parent Legal Guardian Yes  Patient understands his/her participation is voluntary Yes   Affect/Mood: Appropriate   Participation Level: Engaged    Clinical Observations/Individualized Feedback:  Patient attended session and interacted appropriately with therapy dog and peers. Patient asked appropriate questions about therapy dog and his training. Patient shared stories about their pets at home with group.   Plan: Continue to engage patient in RT group sessions 2-3x/week.   Caroll Rancher, Antonietta Jewel 12/15/2021 4:15 PM

## 2021-12-15 NOTE — ED Notes (Signed)
IVC/Consult Completed/Rec.Inpt. Admit  

## 2021-12-16 ENCOUNTER — Encounter (HOSPITAL_COMMUNITY): Payer: Self-pay

## 2021-12-16 DIAGNOSIS — F332 Major depressive disorder, recurrent severe without psychotic features: Secondary | ICD-10-CM

## 2021-12-16 DIAGNOSIS — F1290 Cannabis use, unspecified, uncomplicated: Secondary | ICD-10-CM

## 2021-12-16 LAB — COMPREHENSIVE METABOLIC PANEL
ALT: 17 U/L (ref 0–44)
AST: 18 U/L (ref 15–41)
Albumin: 4.1 g/dL (ref 3.5–5.0)
Alkaline Phosphatase: 70 U/L (ref 38–126)
Anion gap: 8 (ref 5–15)
BUN: 11 mg/dL (ref 6–20)
CO2: 26 mmol/L (ref 22–32)
Calcium: 9.3 mg/dL (ref 8.9–10.3)
Chloride: 106 mmol/L (ref 98–111)
Creatinine, Ser: 0.79 mg/dL (ref 0.44–1.00)
GFR, Estimated: >60 mL/min (ref >60)
Glucose, Bld: 92 mg/dL (ref 70–99)
Potassium: 4.1 mmol/L (ref 3.5–5.1)
Sodium: 140 mmol/L (ref 135–145)
Total Bilirubin: 0.6 mg/dL (ref 0.3–1.2)
Total Protein: 7.3 g/dL (ref 6.5–8.1)

## 2021-12-16 LAB — CBC
HCT: 44.7 % (ref 36.0–46.0)
Hemoglobin: 14.7 g/dL (ref 12.0–15.0)
MCH: 29.8 pg (ref 26.0–34.0)
MCHC: 32.9 g/dL (ref 30.0–36.0)
MCV: 90.5 fL (ref 80.0–100.0)
Platelets: 225 10*3/uL (ref 150–400)
RBC: 4.94 MIL/uL (ref 3.87–5.11)
RDW: 13 % (ref 11.5–15.5)
WBC: 8.2 10*3/uL (ref 4.0–10.5)
nRBC: 0 % (ref 0.0–0.2)

## 2021-12-16 LAB — LIPID PANEL
Cholesterol: 198 mg/dL (ref 0–200)
HDL: 63 mg/dL (ref >40)
LDL Cholesterol: 123 mg/dL — ABNORMAL HIGH (ref 0–99)
Total CHOL/HDL Ratio: 3.1 RATIO
Triglycerides: 61 mg/dL (ref <150)
VLDL: 12 mg/dL (ref 0–40)

## 2021-12-16 LAB — TSH: TSH: 3.132 u[IU]/mL (ref 0.350–4.500)

## 2021-12-16 LAB — HEMOGLOBIN A1C
Hgb A1c MFr Bld: 4.8 % (ref 4.8–5.6)
Mean Plasma Glucose: 91.06 mg/dL

## 2021-12-16 MED ORDER — NON FORMULARY: 25.0000 mg | Freq: Every morning | Status: DC

## 2021-12-16 MED ORDER — ESCITALOPRAM OXALATE 20 MG PO TABS
20.0000 mg | ORAL_TABLET | Freq: Every day | ORAL | Status: DC
  Administered 2021-12-17 – 2021-12-22 (×6): 20 mg via ORAL
  Filled 2021-12-16 (×7): qty 1

## 2021-12-16 MED ORDER — AMPHET-DEXTROAMPHET 3-BEAD ER 25 MG PO CP24: 25.0000 mg | ORAL_CAPSULE | Freq: Every day | ORAL | Status: DC

## 2021-12-16 MED ORDER — BUSPIRONE HCL 10 MG PO TABS
10.0000 mg | ORAL_TABLET | Freq: Two times a day (BID) | ORAL | Status: DC
  Administered 2021-12-16 – 2021-12-22 (×12): 10 mg via ORAL
  Filled 2021-12-16 (×14): qty 1

## 2021-12-16 MED ORDER — GUANFACINE HCL 1 MG PO TABS
1.0000 mg | ORAL_TABLET | Freq: Every day | ORAL | Status: DC
  Administered 2021-12-16 – 2021-12-21 (×6): 1 mg via ORAL
  Filled 2021-12-16 (×8): qty 1

## 2021-12-16 NOTE — Progress Notes (Signed)
Pt visible in dayroom for scheduled groups majority of this shift. Noted with fair eye contact, logical speech, Observed to be appropriate on interactions and interactive with peers and staff. Denies HI, AVH and pain when assessed. Endorsed racing thoughts and passive SI this shift. Per pt "That why I can't sleep because I can't get my mind to turn off". States her SI "They come in waves" however, pt verbally contracts for safety. Rates her anxiety 7/10 and depression 1/10 but unable to elaborate on specific triggers "I don't really know right now what is causing it but I'm ok. I don't need any medication right now". Pt was restarted on her medications this evening. Urine cup given for lab,  sample pending. Safety checks maintained at Q 15 minutes intervals without incident. All medications administered as ordered with verbal education and effects monitored. Emotional support, reassurance and encouragement offered to pt this shift. Pt tolerates all PO intake and medications well. Safety maintained on and off unit.

## 2021-12-16 NOTE — Plan of Care (Signed)
  Problem: Education: Goal: Emotional status will improve Outcome: Not Progressing Goal: Mental status will improve Outcome: Not Progressing   

## 2021-12-16 NOTE — Progress Notes (Signed)
Adult Psychoeducational Group Note  Date:  12/16/2021 Time:  4:39 PM  Group Topic/Focus:  Identifying Needs:   The focus of this group is to help patients identify their personal needs that have been historically problematic and identify healthy behaviors to address their needs.  Participation Level:  Active  Participation Quality:  Appropriate  Affect:  Appropriate  Cognitive:  Appropriate  Insight: Appropriate  Engagement in Group:  Engaged  Modes of Intervention:  Discussion  Additional Comments:  The patient expressed that  a positive changes is  to focus on always being positive.  Octavio Manns 12/16/2021, 4:39 PM

## 2021-12-16 NOTE — Group Note (Signed)
Recreation Therapy Group Note   Group Topic:Stress Management  Group Date: 12/16/2021 Start Time: 0935 End Time: 0955 Facilitators: Caroll Rancher, LRT,CTRS Location: [address] Dayroom   Goal Area(s) Addresses:  Patient will actively participate in stress management techniques presented during session.  Patient will successfully identify benefit of practicing stress management post d/c.   Group Description: Guided Imagery. LRT provided education, instruction, and demonstration on practice of visualization via guided imagery. Patient was asked to participate in the technique introduced during session. LRT debriefed including topics of mindfulness, stress management and specific scenarios each patient could use these techniques. Patients were given suggestions of ways to access scripts post d/c and encouraged to explore Youtube and other apps available on smartphones, tablets, and computers.   Affect/Mood: Appropriate   Participation Level: Engaged   Participation Quality: Independent   Behavior: Appropriate   Speech/Thought Process: Focused   Insight: Good   Judgement: Good   Modes of Intervention: Script, Ambient Music   Patient Response to Interventions:  Engaged   Education Outcome:  Acknowledges education and In group clarification offered    Clinical Observations/Individualized Feedback: Pt attended and participated in group session.     Plan: Continue to engage patient in RT group sessions 2-3x/week.   Caroll Rancher, Antonietta Jewel 12/16/2021 12:38 PM

## 2021-12-16 NOTE — Progress Notes (Signed)
Adult Psychoeducational Group Note  Date:  12/16/2021 Time:  10:18 AM  Group Topic/Focus:  Goals Group:   The focus of this group is to help patients establish daily goals to achieve during treatment and discuss how the patient can incorporate goal setting into their daily lives to aide in recovery.  Participation Level:  Active  Participation Quality:  Appropriate  Affect:  Appropriate  Cognitive:  Appropriate  Insight: Appropriate  Engagement in Group:  Engaged  Modes of Intervention:  Discussion  Additional Comments: The patient expressed that her goal is to talk to the Doctor.  Octavio Manns 12/16/2021, 10:18 AM

## 2021-12-16 NOTE — Group Note (Signed)
LCSW Group Therapy Note   Group Date: 12/16/2021 Start Time: 1300 End Time: 1400  Type of Therapy and Topic: Group Packets; Self-Esteem   Participation Level:  Active  Due to the acuity and complex discharge plans, group was not held. Patient was provided therapeutic worksheets and asked to meet with CSW as needed.  Aram Beecham, LCSWA 12/16/2021  1:24 PM

## 2021-12-16 NOTE — BH IP Treatment Plan (Signed)
Interdisciplinary Treatment and Diagnostic Plan Update  12/16/2021 Time of Session: 10:00am  Melinda Mills MRN: [id number]  Principal Diagnosis: MDD (major depressive disorder), recurrent, severe, with psychosis ([place])  Secondary Diagnoses: Principal Problem:   MDD (major depressive disorder), recurrent, severe, with psychosis ([place])   Current Medications:  Current Facility-Administered Medications  Medication Dose Route Frequency Provider Last Rate Last Admin   acetaminophen (TYLENOL) tablet 650 mg  650 mg Oral Q6H PRN Ntuen, Kris Hartmann, FNP       alum & mag hydroxide-simeth (MAALOX/MYLANTA) 200-200-20 MG/5ML suspension 30 mL  30 mL Oral Q4H PRN Ntuen, Kris Hartmann, FNP       hydrOXYzine (ATARAX) tablet 25 mg  25 mg Oral TID PRN Laretta Bolster, FNP   25 mg at 12/15/21 2124   magnesium hydroxide (MILK OF MAGNESIA) suspension 30 mL  30 mL Oral Daily PRN Ntuen, Kris Hartmann, FNP       traZODone (DESYREL) tablet 50 mg  50 mg Oral QHS PRN Ntuen, Kris Hartmann, FNP       PTA Medications: Medications Prior to Admission  Medication Sig Dispense Refill Last Dose   BALZIVA 0.4-35 MG-MCG tablet Take 1 tablet by mouth daily. (Patient not taking: Reported on 12/14/2021)      busPIRone (BUSPAR) 10 MG tablet Take 1 tablet (10 mg total) by mouth 2 (two) times daily. (Patient not taking: Reported on 12/14/2021) 180 tablet 0    hydrOXYzine (ATARAX) 25 MG tablet TAKE 1/2-1 TABLETS (12.5-25 MG TOTAL) BY MOUTH 2 (TWO) TIMES DAILY AS NEEDED. 180 tablet 1    methylphenidate (METADATE CD) 40 MG CR capsule Take 40 mg by mouth daily. (Patient not taking: Reported on 11/03/2021)      MYDAYIS 37.5 MG CP24 Take 1 capsule by mouth daily. (Patient not taking: Reported on 12/14/2021)       Patient Stressors: Financial difficulties   Legal issue   Marital or family conflict   Occupational concerns    Patient Strengths: Ability for Engineer, materials  Motivation for treatment/growth   Treatment Modalities: Medication Management,  Group therapy, Case management,  1 to 1 session with clinician, Psychoeducation, Recreational therapy.   Physician Treatment Plan for Primary Diagnosis: MDD (major depressive disorder), recurrent, severe, with psychosis ([place]) Long Term Goal(s):     Short Term Goals:    Medication Management: Evaluate patient's response, side effects, and tolerance of medication regimen.  Therapeutic Interventions: 1 to 1 sessions, Unit Group sessions and Medication administration.  Evaluation of Outcomes: Not Met  Physician Treatment Plan for Secondary Diagnosis: Principal Problem:   MDD (major depressive disorder), recurrent, severe, with psychosis ([place])  Long Term Goal(s):     Short Term Goals:       Medication Management: Evaluate patient's response, side effects, and tolerance of medication regimen.  Therapeutic Interventions: 1 to 1 sessions, Unit Group sessions and Medication administration.  Evaluation of Outcomes: Not Met   RN Treatment Plan for Primary Diagnosis: MDD (major depressive disorder), recurrent, severe, with psychosis ([place]) Long Term Goal(s): Knowledge of disease and therapeutic regimen to maintain health will improve  Short Term Goals: Ability to remain free from injury will improve, Ability to participate in decision making will improve, Ability to verbalize feelings will improve, Ability to disclose and discuss suicidal ideas, and Ability to identify and develop effective coping behaviors will improve  Medication Management: RN will administer medications as ordered by provider, will assess and evaluate patient's response and provide education to patient for prescribed medication.  RN will report any adverse and/or side effects to prescribing provider.  Therapeutic Interventions: 1 on 1 counseling sessions, Psychoeducation, Medication administration, Evaluate responses to treatment, Monitor vital signs and CBGs as ordered, Perform/monitor CIWA, COWS, AIMS and Fall Risk  screenings as ordered, Perform wound care treatments as ordered.  Evaluation of Outcomes: Not Met   LCSW Treatment Plan for Primary Diagnosis: MDD (major depressive disorder), recurrent, severe, with psychosis ([place]) Long Term Goal(s): Safe transition to appropriate next level of care at discharge, Engage patient in therapeutic group addressing interpersonal concerns.  Short Term Goals: Engage patient in aftercare planning with referrals and resources, Increase social support, Increase emotional regulation, Facilitate acceptance of mental health diagnosis and concerns, Identify triggers associated with mental health/substance abuse issues, and Increase skills for wellness and recovery  Therapeutic Interventions: Assess for all discharge needs, 1 to 1 time with Social worker, Explore available resources and support systems, Assess for adequacy in community support network, Educate family and significant other(s) on suicide prevention, Complete Psychosocial Assessment, Interpersonal group therapy.  Evaluation of Outcomes: Not Met   Progress in Treatment: Attending groups: Yes. Participating in groups: Yes. Taking medication as prescribed: Yes. Toleration medication: Yes. Family/Significant other contact made: Yes, individual(s) contacted:  Mother  Patient understands diagnosis: Yes. Discussing patient identified problems/goals with staff: Yes. Medical problems stabilized or resolved: Yes. Denies suicidal/homicidal ideation: Yes. Issues/concerns per patient self-inventory: No.   New problem(s) identified: No, Describe:  None   New Short Term/Long Term Goal(s): medication stabilization, elimination of SI thoughts, development of comprehensive mental wellness plan.   Patient Goals: "To stop my suicidal thoughts"   Discharge Plan or Barriers: Patient recently admitted. CSW will continue to follow and assess for appropriate referrals and possible discharge planning.   Reason for  Continuation of Hospitalization: Anxiety Depression Medication stabilization Suicidal ideation  Estimated Length of Stay: 3 to 7 days   Last [place] Suicide Severity Risk Score: [place] Admission (Current) from 12/15/2021 in [facility] ED from 12/14/2021 in [facility] Office Visit from 11/03/2021 in confidential department  C-SSRS RISK CATEGORY High Risk High Risk Low Risk       Last PHQ 2/9 Scores:    11/03/2021    1:11 PM 07/21/2021    4:39 PM 06/23/2021    2:56 PM  Depression screen PHQ 2/9  Decreased Interest 0 0 0  Down, Depressed, Hopeless 2 0 0  PHQ - 2 Score 2 0 0  Altered sleeping 1    Tired, decreased energy 1    Change in appetite 2    Feeling bad or failure about yourself  3    Trouble concentrating 2    Moving slowly or fidgety/restless 0    Suicidal thoughts 1    PHQ-9 Score 12    Difficult doing work/chores Very difficult      Scribe for Treatment Team: Darleen Crocker, Latanya Presser 12/16/2021 3:20 PM

## 2021-12-16 NOTE — Progress Notes (Signed)
Pt attended NA group, and actively participated. 

## 2021-12-16 NOTE — H&P (Signed)
Psychiatric Admission Assessment Adult  Patient Identification: Leonore Frankson MRN:  [id number] Date of Evaluation:  12/16/2021 Chief Complaint:  MDD (major depressive disorder), recurrent, severe, with psychosis (HCC) [F33.3] Principal Diagnosis: MDD (major depressive disorder), recurrent, severe, with psychosis (HCC) Diagnosis:  Principal Problem:   MDD (major depressive disorder), recurrent, severe, with psychosis (HCC) Active Problems:   History of seizure   GAD (generalized anxiety disorder)   ADHD (attention deficit hyperactivity disorder), combined type   Cannabis use, uncomplicated  History of Present Illness: Emelynn Rance is a 31 year-old woman with a reported past psychiatric history of MDD, anxiety, and ADHD, who was brought to the ED by a concerned friend after the friend was informed that the patient had prepared suicide notes for her family to find the next day. She was placed under involuntary commitment status due to suicidal ideation with intention and plan to overdose on pills.  Prior to admission psychiatric medications: Patient describes cessation of all psychiatric medications in June of this year, then restarting lexapro, buspar and guanfacine about 1 month ago. Of note, she is a [employer] manager and does not take ADHD medication (Mydayis) during summer months.  However, prior to cessation of medications in June her regimen included: -Buspar 10 mg BID (taken as once daily) -Lexapro 20 mg daily -Guanfacine 1 mg daily -Mydayis 37.5 mg daily   Per patient, her psychiatric diagnoses were adequately controlled with her historical regimen (as below) until three months ago when a series of significant stressors appeared in her life. The patient began the process of separation from her husband approximately three months prior to admission after she impulsively "made out" with two other partners (denies sexual contact). Approximately two months prior to admission, an  important ex-boyfriend returned to town after 8 years away, and dealing with the dynamic of seeing her ex-boyfriend and separating from her husband has been a significant stressor. Over these months, the patient describes increasing struggles with hyperactivity, restlessness, difficulty relaxing, being too talkative, acting without thinking, impatience, interrupting others, spending too much, impulsive intense relationships, not being able to postpone gratification, sensation seeking and risk-taking behaviors, mood lability, low frustration tolerance, emotional impulsivity, irritability, and anger outburst.  Patient reports that suicidal thoughts were impulsive, and that she researched which pills would be more lethal, adequate quantity, and estimated the amount of pills she would need to achieve lethality.  Psychiatric review of systems: Patient reports that her mood has been dysphoric but denies pervasive sadness and anhedonia for the weeks leading up to this admission.  She does report poor sleep, about 4 hours per sleep per night.  Reports that energy is up and down.  Reports that appetite is normal (of note, patient reports low appetite when she is on her stimulant medication).  Reports difficulty with concentration, that is chronic.  At this time she denies having any suicidal thoughts and homicidal thoughts.  Denies any psychotic symptoms.  Reports anxiety is excessive, generalized, chronic, elevated.  Reports having panic attacks about once per week.  In regards to symptoms of hypomanic or manic episodes, at this time she does not meet criteria for having a hypomanic or manic episode in the past, but she does describe symptoms such as periods of time of elevated mood lasting for about 3 days, when she has more impulsivity, spending more money, is more talkative.  We discussed at length, that this is more likely due to untreated ADHD hyperactivity and impulsive symptoms. She reports a history of traumatic  neglect.  Past psychiatric history: Patient describes a historical diagnosis of ADHD from childhood which was re-evaluated and confirmed in 2020. Additionally, she was diagnosed with MDD during her first year of undergraduate (2012). She also describes significant anxiety and panic attacks which began shortly after the birth of her son (2019). She was treated for MDD with Zoloft in college but stopped at graduation due to inconvenience/lack of efficacy. However, in 2020 she began treatment with Lexapro for depression and anxiety with Buspar added to her regimen in 2021. Her ADHD was treated with methylphenidate in childhood. After ADHD re-diagnosis in 2020 she failed trials of Adderall ER and methylphenidate and has been maintained on Mydayis 37.5 mg daily. Two weeks prior to admission, she began a trial of guanfacine 1 mg daily for additional inadequately controlled ADHD symptoms such as impulsivity and agitation. She denies previous psychiatric hospitalization.  Patient reports that 1 time in college she drank excessive alcohol and intent to harm herself and overdose.  She denies other suicide attempts in the past.   Past medical history: Patient describes historical diagnosis of a "seizure disorder" for which she was prescribed Keppra (from approximately 2016-2017). Seizure activity is associated with heightened emotional states and patient can feel the coming on. Per patient, diagnosis was confirmed on EEG.  Patient reports having 2 seizure-like episodes in the past 2 months, coinciding with stress and emotions, with similar symptoms to those that occurred in 2016/2017.  Not currently on Keppra. Additionally, patient has a known family history of Huntington's. 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 Surgical history: Appendectomy NKDA   Social history: Separating from husband.   1 son  but is 4.  Employed.  Substance use history: Rare tobacco use, once per month to once per week.  Not regular.  Smokes marijuana in the evenings.  Reports binge type alcohol use, but has behavioral outburst during this time, and is also negatively affected her life.  Rule out alcohol use disorder.  Total Time spent with patient: 1 hour   Is the patient at risk to self? Yes.    Has the patient been a risk to self in the past 6 months? Yes.    Has the patient been a risk to self within the distant past? Yes.    Is the patient a risk to others? No.  Has the patient been a risk to others in the past 6 months? No.  Has the patient been a risk to others within the distant past? No.   [place] Scale:  Flowsheet Row [facility] ([facility]) [facility] from 12/14/2021 in [facility] EM[facility]PART[facility] Office Visit from 11/03/2021 in [facility]  C-SSRS RISK CATEGORY High Risk High Risk Low Risk        Prior Inpatient Therapy:   Prior Outpatient Therapy:    Alcohol Screening: Patient refused Alcohol Screening Tool: Yes 1. How often do you have a drink containing alcohol?: Monthly or less 2. How many drinks containing alcohol do you have on a typical day when you are drinking?: 1 or 2 3. How often do you have six or more drinks on one occasion?: Never AUDIT-C Score: 1 4. How often during the last year have you found that you were not able to stop drinking once you had started?: Never 5. How often during the last year  have you failed to do what was normally expected from you because of drinking?: Never 6. How often during the last year have you needed a first drink in the morning to get yourself going after a heavy drinking session?: Never 7. How often during the last year have you had a feeling of guilt of remorse after drinking?: Never 8. How often during the last year have you been unable to  remember what happened the night before because you had been drinking?: Never 9. Have you or someone else been injured as a result of your drinking?: No 10. Has a relative or friend or a doctor or another health worker been concerned about your drinking or suggested you cut down?: No Alcohol Use Disorder Identification Test Final Score (AUDIT): 1 Substance Abuse History in the last 12 months:  Yes.   Consequences of Substance Abuse: Negative Medical Consequences:  psychiatric decline Previous Psychotropic Medications: Yes  Psychological Evaluations: Yes  Past Medical History:  Past Medical History:  Diagnosis Date   ADHD (attention deficit hyperactivity disorder)    Depression    Seizures (HCC)     Past Surgical History:  Procedure Laterality Date   APPENDECTOMY  2008/2009   Family History:  Family History  Adopted: Yes    Tobacco Screening:   occasional, not regular, see above  Social History:  Social History   Substance and Sexual Activity  Alcohol Use Not Currently     Social History   Substance and Sexual Activity  Drug Use Not Currently   Types: Marijuana    Additional Social History:                           Allergies:  No Known Allergies Lab Results:  Results for orders placed or performed during the hospital encounter of 12/15/21 (from the past 48 hour(s))  CBC     Status: None   Collection Time: 12/16/21  6:39 AM  Result Value Ref Range   WBC 8.2 4.0 - 10.5 K/uL   RBC 4.94 3.87 - 5.11 MIL/uL   Hemoglobin 14.7 12.0 - 15.0 g/dL   HCT 78.2 95.6 - 21.3 %   MCV 90.5 80.0 - 100.0 fL   MCH 29.8 26.0 - 34.0 pg   MCHC 32.9 30.0 - 36.0 g/dL   RDW 08.6 57.8 - 46.9 %   Platelets 225 150 - 400 K/uL   nRBC 0.0 0.0 - 0.2 %    Comment: Performed at [facility], [address], [place], [place] [address]  Comprehensive metabolic panel     Status: None   Collection Time: 12/16/21  6:39 AM  Result Value Ref Range   Sodium 140 135 -  145 mmol/L   Potassium 4.1 3.5 - 5.1 mmol/L   Chloride 106 98 - 111 mmol/L   CO2 26 22 - 32 mmol/L   Glucose, Bld 92 70 - 99 mg/dL    Comment: Glucose reference range applies only to samples taken after fasting for at least 8 hours.   BUN 11 6 - 20 mg/dL   Creatinine, Ser 8.41 0.44 - 1.00 mg/dL   Calcium 9.3 8.9 - 32.4 mg/dL   Total Protein 7.3 6.5 - 8.1 g/dL   Albumin 4.1 3.5 - 5.0 g/dL   AST 18 15 - 41 U/L   ALT 17 0 - 44 U/L   Alkaline Phosphatase 70 38 - 126 U/L   Total Bilirubin 0.6 0.3 - 1.2  mg/dL   GFR, Estimated >62 >70 mL/min    Comment: (NOTE) Calculated using the CKD-EPI Creatinine Equation (2021)    Anion gap 8 5 - 15    Comment: Performed at [facility], [address], [place], [place] [address]  Hemoglobin A1c     Status: None   Collection Time: 12/16/21  6:39 AM  Result Value Ref Range   Hgb A1c MFr Bld 4.8 4.8 - 5.6 %    Comment: (NOTE) Pre diabetes:          5.7%-6.4%  Diabetes:              >6.4%  Glycemic control for   <7.0% adults with diabetes    Mean Plasma Glucose 91.06 mg/dL    Comment: Performed at [facility], [address] [place], [place] [address]  Lipid panel     Status: Abnormal   Collection Time: 12/16/21  6:39 AM  Result Value Ref Range   Cholesterol 198 0 - 200 mg/dL   Triglycerides 61 <993 mg/dL   HDL 63 >71 mg/dL   Total CHOL/HDL Ratio 3.1 RATIO   VLDL 12 0 - 40 mg/dL   LDL Cholesterol 696 (H) 0 - 99 mg/dL    Comment:        Total Cholesterol/HDL:CHD Risk Coronary Heart Disease Risk Table                     Men   Women  1/2 Average Risk   3.4   3.3  Average Risk       5.0   4.4  2 X Average Risk   9.6   7.1  3 X Average Risk  23.4   11.0        Use the calculated Patient Ratio above and the CHD Risk Table to determine the patient's CHD Risk.        ATP III CLASSIFICATION (LDL):  <100     mg/dL   Optimal  789-381  mg/dL   Near or Above                    Optimal  130-159  mg/dL    Borderline  017-510  mg/dL   High  >258     mg/dL   Very High Performed at [facility], [address] [place], [place] [address]   TSH     Status: None   Collection Time: 12/16/21  6:39 AM  Result Value Ref Range   TSH 3.132 0.350 - 4.500 uIU/mL    Comment: Performed by a 3rd Generation assay with a functional sensitivity of <=0.01 uIU/mL. Performed at [facility], [address] [place], [place] [address]     Blood Alcohol level:  Lab Results  Component Value Date   ETH <10 12/14/2021    Metabolic Disorder Labs:  Lab Results  Component Value Date   HGBA1C 4.8 12/16/2021   MPG 91.06 12/16/2021   No results found for: "PROLACTIN" Lab Results  Component Value Date   CHOL 198 12/16/2021   TRIG 61 12/16/2021   HDL 63 12/16/2021   CHOLHDL 3.1 12/16/2021   VLDL 12 12/16/2021   LDLCALC 123 (H) 12/16/2021    Current Medications: Current Facility-Administered Medications  Medication Dose Route Frequency Provider Last Rate Last Admin   acetaminophen (TYLENOL) tablet 650 mg  650 mg Oral Q6H PRN Ntuen, Jesusita Oka, FNP  alum & mag hydroxide-simeth (MAALOX/MYLANTA) 200-200-20 MG/5ML suspension 30 mL  30 mL Oral Q4H PRN Ntuen, Jesusita Oka, FNP       hydrOXYzine (ATARAX) tablet 25 mg  25 mg Oral TID PRN Cecilie Lowers, FNP   25 mg at 12/15/21 2124   magnesium hydroxide (MILK OF MAGNESIA) suspension 30 mL  30 mL Oral Daily PRN Ntuen, Jesusita Oka, FNP       traZODone (DESYREL) tablet 50 mg  50 mg Oral QHS PRN Ntuen, Jesusita Oka, FNP       PTA Medications: Medications Prior to Admission  Medication Sig Dispense Refill Last Dose   BALZIVA 0.4-35 MG-MCG tablet Take 1 tablet by mouth daily. (Patient not taking: Reported on 12/14/2021)      busPIRone (BUSPAR) 10 MG tablet Take 1 tablet (10 mg total) by mouth 2 (two) times daily. (Patient not taking: Reported on 12/14/2021) 180 tablet 0    hydrOXYzine (ATARAX) 25 MG tablet TAKE 1/2-1 TABLETS (12.5-25 MG TOTAL) BY  MOUTH 2 (TWO) TIMES DAILY AS NEEDED. 180 tablet 1    methylphenidate (METADATE CD) 40 MG CR capsule Take 40 mg by mouth daily. (Patient not taking: Reported on 11/03/2021)      MYDAYIS 37.5 MG CP24 Take 1 capsule by mouth daily. (Patient not taking: Reported on 12/14/2021)       Musculoskeletal: Strength & Muscle Tone: within normal limits Gait & Station: normal Patient leans: N/A            Psychiatric Specialty Exam:  Presentation  General Appearance: Casual  Eye Contact:Good  Speech:Normal Rate  Speech Volume:Normal  Handedness:Right   Mood and Affect  Mood:Anxious; Dysphoric  Affect:Appropriate; Congruent; Full Range   Thought Process  Thought Processes:Linear  Duration of Psychotic Symptoms: No data recorded Past Diagnosis of Schizophrenia or Psychoactive disorder: No  Descriptions of Associations:Intact  Orientation:Full (Time, Place and Person)  Thought Content:Logical  Hallucinations:Hallucinations: None  Ideas of Reference:None  Suicidal Thoughts:Suicidal Thoughts: No  Homicidal Thoughts:Homicidal Thoughts: No   Sensorium  Memory:Immediate Good; Recent Good; Remote Good  Judgment:Impaired  Insight:Lacking   Executive Functions  Concentration:Fair  Attention Span:Fair  Recall:Good  Fund of Knowledge:Good  Language:Good   Psychomotor Activity  Psychomotor Activity:Psychomotor Activity: Normal  Assets  Assets:Communication Skills; Desire for Improvement; Leisure Time; Physical Health; Social Support; Resilience   Sleep  Sleep:Sleep: Poor   Physical Exam: Physical Exam Vitals reviewed.  Constitutional:      General: She is not in acute distress.    Appearance: She is normal weight. She is not ill-appearing or toxic-appearing.  Pulmonary:     Effort: Pulmonary effort is normal.  Neurological:     Mental Status: She is alert.     Motor: No weakness.     Gait: Gait normal.  Psychiatric:        Thought Content:  Thought content normal.    Review of Systems  Constitutional:  Negative for chills and fever.  Cardiovascular:  Negative for chest pain and palpitations.  Neurological:  Negative for dizziness, tingling, tremors and headaches.  Psychiatric/Behavioral:  Positive for depression, substance abuse and suicidal ideas. Negative for hallucinations and memory loss. The patient is nervous/anxious and has insomnia.   All other systems reviewed and are negative.  Blood pressure 130/68, pulse 87, temperature 97.9 F (36.6 C), temperature source Oral, resp. rate 16, height 5\' 3"  (1.6 m), weight 78 kg, SpO2 100 %. Body mass index is 30.47 kg/m.  Treatment Plan Summary: Daily contact with patient  to assess and evaluate symptoms and progress in treatment   ASSESSMENT:  Diagnoses / Active Problems: -MDD severe recurrent without psychosis -GAD with panic attacks -ADHD Rule out cannabis use disorder Rule out alcohol use disorder History of epilepsy +/-pseudoseizures ?  Huntington disease risk on genetic testing  PLAN: Safety and Monitoring:  -- Involuntary admission to inpatient psychiatric unit for safety, stabilization and treatment  -- Daily contact with patient to assess and evaluate symptoms and progress in treatment  -- Patient's case to be discussed in multi-disciplinary team meeting  -- Observation Level : q15 minute checks  -- Vital signs:  q12 hours  -- Precautions: suicide, elopement, and assault  2. Psychiatric Diagnoses and Treatment:    -Continue Lexapro 20 mg once a day for MDD and GAD -Increase BuSpar from 10 mg once daily to 10 mg twice daily -Continue guanfacine 1 mg nightly for ADHD and impulse control -Start Keppra 500 mg twice daily for seizure disorder -Restart Mydayis 25 mg qam for adhd - will restart at lower dose as pt has not taken for a few months. Montior for s/e and appetite suppression.  It is my clinical opinion, the patient's untreated ADHD has caused  hyperactive, impulsive, and emotional dysregulation symptoms, the consequences of which the turmoil in her life and misery that affected her depression and suicidal symptoms to profoundly worsen, ultimately resulting in severe suicidal thoughts, with intent and plan, to the extent that the patient wrote 3 suicide notes and research lethality of method. -F/u EKG (already ordered)     --  The risks/benefits/side-effects/alternatives to this medication were discussed in detail with the patient and time was given for questions. The patient consents to medication trial.    -- Metabolic profile and EKG monitoring obtained while on an atypical antipsychotic (BMI: Lipid Panel: HbgA1c: QTc:)   -- Encouraged patient to participate in unit milieu and in scheduled group therapies   -- Short Term Goals: Ability to identify changes in lifestyle to reduce recurrence of condition will improve, Ability to verbalize feelings will improve, Ability to disclose and discuss suicidal ideas, Ability to demonstrate self-control will improve, Ability to identify and develop effective coping behaviors will improve, Ability to maintain clinical measurements within normal limits will improve, Compliance with prescribed medications will improve, and Ability to identify triggers associated with substance abuse/mental health issues will improve  -- Long Term Goals: Improvement in symptoms so as ready for discharge    3. Medical Issues Being Addressed:   Tobacco Use Disorder  -- Nicotine patch gum prn  -start keppra for h/o seizure d/o and recent seizure like activity. Pt will need to f/u with neurologist  -will recommend not driving until neurologist eval, at dc   4. Discharge Planning:   -- Social work and case management to assist with discharge planning and identification of hospital follow-up needs prior to discharge  -- Estimated LOS: 5-7 days  -- Discharge Concerns: Need to establish a safety plan; Medication  compliance and effectiveness  -- Discharge Goals: Return home with outpatient referrals for mental health follow-up including medication management/psychotherapy    Physician Treatment Plan for Primary Diagnosis: MDD (major depressive disorder), recurrent, severe, with psychosis [facility])  Physician Treatment Plan for Secondary Diagnosis: Principal Problem:   MDD (major depressive disorder), recurrent, severe, with psychosis (HCC) Active Problems:   History of seizure   GAD (generalized anxiety disorder)   ADHD (attention deficit hyperactivity disorder), combined type   Cannabis use, uncomplicated  I certify that inpatient  services furnished can reasonably be expected to improve the patient's condition.    Cristy HiltsNathan W Zoeann Mol, MD 7/26/20235:04 PM  Total Time Spent in Direct Patient Care:  I personally spent 75 minutes on the unit in direct patient care. The direct patient care time included face-to-face time with the patient, reviewing the patient's chart, communicating with other professionals, and coordinating care. Greater than 50% of this time was spent in counseling or coordinating care with the patient regarding goals of hospitalization, psycho-education, and discharge planning needs.  I personally was present and performed or re-performed the history, physical exam and medical decision-making activities of this service and have verified that the service and findings are accurately documented in the student's note, , as addended by me or notated below:  I directly edited the note, as above.   Phineas InchesNathan Daequan Kozma, MD Psychiatrist

## 2021-12-16 NOTE — Progress Notes (Signed)
     12/15/21 2124  Psych Admission Type (Psych Patients Only)  Admission Status Involuntary  Psychosocial Assessment  Patient Complaints Anxiety  Eye Contact Brief  Facial Expression Anxious  Affect Depressed  Speech Logical/coherent  Interaction Assertive  Motor Activity Other (Comment) (WDL)  Appearance/Hygiene Unremarkable  Behavior Characteristics Cooperative;Appropriate to situation  Mood Pleasant;Anxious  Thought Process  Coherency WDL  Content WDL  Delusions None reported or observed  Perception WDL  Hallucination None reported or observed  Judgment Poor  Confusion WDL  Danger to Self  Current suicidal ideation? Denies  Agreement Not to Harm Self Yes  Description of Agreement verbal contract  Danger to Others  Danger to Others None reported or observed

## 2021-12-17 LAB — RAPID URINE DRUG SCREEN, HOSP PERFORMED
Amphetamines: NOT DETECTED
Barbiturates: NOT DETECTED
Benzodiazepines: NOT DETECTED
Cocaine: NOT DETECTED
Opiates: NOT DETECTED
Tetrahydrocannabinol: NOT DETECTED

## 2021-12-17 LAB — URINALYSIS, COMPLETE (UACMP) WITH MICROSCOPIC
Bilirubin Urine: NEGATIVE
Glucose, UA: NEGATIVE mg/dL
Hgb urine dipstick: NEGATIVE
Ketones, ur: NEGATIVE mg/dL
Nitrite: NEGATIVE
Protein, ur: NEGATIVE mg/dL
Specific Gravity, Urine: 1.006 (ref 1.005–1.030)
pH: 6 (ref 5.0–8.0)

## 2021-12-17 LAB — PREGNANCY, URINE: Preg Test, Ur: NEGATIVE

## 2021-12-17 MED ORDER — LEVETIRACETAM 500 MG PO TABS
500.0000 mg | ORAL_TABLET | Freq: Two times a day (BID) | ORAL | Status: DC
  Administered 2021-12-17 – 2021-12-22 (×10): 500 mg via ORAL
  Filled 2021-12-17 (×13): qty 1

## 2021-12-17 NOTE — Plan of Care (Signed)
  Problem: Activity: Goal: Interest or engagement in activities will improve Outcome: Progressing   Problem: Education: Goal: Emotional status will improve Outcome: Not Progressing Goal: Mental status will improve Outcome: Not Progressing   

## 2021-12-17 NOTE — Progress Notes (Signed)
Patient rated her day as a 4 out of 10 since she is dealing with some family issues. Her positive event for the day is that she "didn't go off".

## 2021-12-17 NOTE — Group Note (Signed)
Date:  12/17/2021 Time:  3:57 PM  Group Topic/Focus:  Self Care:   The focus of this group is to help patients understand the importance of self-care in order to improve or restore emotional, physical, spiritual, interpersonal, and financial health.    Participation Level:  Active  Participation Quality:  Appropriate  Affect:  Appropriate  Cognitive:  Appropriate  Insight: Appropriate  Engagement in Group:  Engaged  Modes of Intervention:  Discussion  Additional Comments:   patient participated in group.  Reymundo Poll 12/17/2021, 3:57 PM

## 2021-12-17 NOTE — Group Note (Signed)
Date:  12/17/2021 Time:  3:54 PM  Group Topic/Focus:  Wellness Toolbox:   The focus of this group is to discuss various aspects of wellness, balancing those aspects and exploring ways to increase the ability to experience wellness.  Patients will create a wellness toolbox for use upon discharge.    Participation Level:  Active  Participation Quality:  Appropriate  Affect:  Appropriate  Cognitive:  Appropriate  Insight: Appropriate  Engagement in Group:  Engaged  Modes of Intervention:  Discussion  Additional Comments:   Patient was engaged in today's group.   Reymundo Poll 12/17/2021, 3:54 PM
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
## 2021-12-17 NOTE — Progress Notes (Signed)
[facility] MD Progress Note  12/17/2021 1:39 PM Melinda Mills  MRN:  [id number] Subjective:  Patient is a 31 year-old woman with a reported past psychiatric history of MDD, anxiety, and ADHD, who was brought to the ED by a concerned friend after the friend was informed that the patient had prepared suicide notes for her family to find the next day. She was placed under involuntary commitment status due to suicidal ideation with intention and plan to overdose on pills.  Yesterday, the psychiatry team made following recommendations: -Continue Lexapro 20 mg once a day for MDD and GAD -Increase BuSpar from 10 mg once daily to 10 mg twice daily -Continue guanfacine 1 mg nightly for ADHD and impulse control ?-Start Keppra 500 mg twice daily for seizure disorder -Restart Mydayis 25 mg qam for ADHD  Interval History: PRN Medications administered within the last 24 hours: [Atarax available q8h for anxiety taken x1]  Per Patient:  On assessment patient was evaluated at bedside. She reports passive SI overnight which she attributes to anger about interactions with her husband (separated), especially his lack of action when noticing significant changes in her behavior 2 years ago. Throughout interview patient is energetic and talkative with circumstantial thought process, often ruminating back to the potential outcomes if there had been intervention as soon as husband notices changes. She reports continued problems prioritizing, low frustration tolerance, and impulsivity.   Today patient reports that mood is less depressed.Her anxiety was exacerbated last night after a visit with her mother which revealed that her husband realized she was changing 2 years prior to the current admission. Patient remains somewhat anxious, but improved significantly from previous night. With decreased anxiety, patient reports less feelings of low frustration tolerance and impulsivity. She reports that sleep is better but is drowsy  through the afternoon (when evaluated by attending later) and she attributes this to hydroxyzine. She is offered lower dose or not to take this, and she declines.   Patient denies current SI, HI, and AVH. Patient denies side effects to current scheduled psychiatric medications.   Patient denies any somatic complaints.    Principal Problem: MDD (major depressive disorder), recurrent, severe, with psychosis (HCC) Diagnosis: Principal Problem:   MDD (major depressive disorder), recurrent, severe, with psychosis (HCC) Active Problems:   History of seizure   GAD (generalized anxiety disorder)   ADHD (attention deficit hyperactivity disorder), combined type   Cannabis use, uncomplicated   Past Psychiatric History: Historical Psychiatric Diagnoses: MDD diagnosed in first year of college (2012), GAD with peripartum onset (2019), and ADHD which was diagnosed in childhood. Also, patient was formally evaluated and re-diagnosed with ADHD in 2020. She denies previous psychiatric hospitalization  Current Psychiatric Treatment: Patient uses [employer] chat feature for spot therapy needs. Her outpatient psychiatric regimen is managed by [facility]  Psychiatric Medication History: Patient was treated for MDD with Zoloft in college but stopped at graduation due to inconvenience/lack of efficacy. Her ADHD was treated with methylphenidate in childhood. Current regimen includes Lexapro added for MDD/GAD in 2020 with Buspar added to her regimen in 2021 for anxiety. After ADHD re-diagnosis in 2020 she failed trials of Adderall ER and methylphenidate and has been maintained on Mydayis 37.5 mg daily. Two weeks prior to admission, home Psychiatrist modified ADHD treatment by decreasing Mydayis to 25 mg daily and adding Guanfacine 1 mg daily to control for symptoms of agitation and impulsivity  Psychiatric Medication Compliance History: Patient reports stopping all medications at the  beginning of  June because she wanted to see if she could manage her diagnoses with non-pharmacologic intervention alone. However, family and friends noted differences including increased agitation, impulsivity, and distractibility and with their encouragement the patient restarted Buspar and Lexapro approximately 4-5 weeks later at the beginning of July. 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  Patient reports that 1 time in college she drank excessive alcohol and intent to harm herself and overdose.  She denies other suicide attempts in the past.  Past Medical History:  Past Medical History:  Diagnosis Date   ADHD (attention deficit hyperactivity disorder)    Depression    Seizures (HCC)     Past Surgical History:  Procedure Laterality Date   APPENDECTOMY  2008/2009   Family History:  Family History  Adopted: Yes   Family Psychiatric  History: Patient denies any known family history of psychiatric illness.   Social History:  Social History   Substance and Sexual Activity  Alcohol Use Not Currently     Social History   Substance and Sexual Activity  Drug Use Not Currently   Types: Marijuana    Social History   Socioeconomic History   Marital status: Legally Separated    Spouse name: Not on file   Number of children: 1   Years of education: Not on file   Highest education level: Bachelor's degree (e.g., BA, AB, BS)  Occupational History   Not on file  Tobacco Use   Smoking status: Never   Smokeless tobacco: Never  Vaping Use   Vaping Use: Never used  Substance and Sexual Activity   Alcohol use: Not Currently   Drug use: Not Currently    Types: Marijuana   Sexual activity: Yes  Other Topics Concern   Not on file  Social History Narrative   Not on file   Social Determinants of Health   Financial Resource Strain: Low Risk  (08/15/2018)   Overall  Financial Resource Strain (CARDIA)    Difficulty of Paying Living Expenses: Not very hard  Food Insecurity: No Food Insecurity (02/09/2018)   Hunger Vital Sign    Worried About Running Out of Food in the Last Year: Never true    Ran Out of Food in the Last Year: Never true  Transportation Needs: No Transportation Needs (02/09/2018)   PRAPARE - Administrator, Civil Service (Medical): No    Lack of Transportation (Non-Medical): No  Physical Activity: Inactive (02/09/2018)   Exercise Vital Sign    Days of Exercise per Week: 0 days    Minutes of Exercise per Session: 0 min  Stress: No Stress Concern Present (02/09/2018)   [employer] of Occupational Health - Occupational Stress Questionnaire    Feeling of Stress : Not at all  Social Connections: Unknown (08/15/2018)   Social Connection and Isolation Panel [NHANES]    Frequency of Communication with Friends and Family: Not on file    Frequency of Social Gatherings with Friends and Family: Not on file    Attends Religious Services: Not on file    Active Member of Clubs or Organizations: Not on file    Attends Banker Meetings: Not on file    Marital Status: Living with partner    Sleep: better  Appetite:  Fair  Current Medications: Current Facility-Administered Medications  Medication Dose Route Frequency Provider Last Rate Last Admin   acetaminophen (TYLENOL)  tablet 650 mg  650 mg Oral Q6H PRN Ntuen, Jesusita Oka, FNP       alum & mag hydroxide-simeth (MAALOX/MYLANTA) 200-200-20 MG/5ML suspension 30 mL  30 mL Oral Q4H PRN Ntuen, Jesusita Oka, FNP       Amphet-Dextroamphet 3-Bead ER 25 MG CP24 25 mg  25 mg Oral Daily Chardai Gangemi, Harrold Donath, MD       busPIRone (BUSPAR) tablet 10 mg  10 mg Oral BID Phineas Inches, MD   10 mg at 12/17/21 0630   escitalopram (LEXAPRO) tablet 20 mg  20 mg Oral Daily Lashane Whelpley, Harrold Donath, MD   20 mg at 12/17/21 1601   guanFACINE (TENEX) tablet 1 mg  1 mg Oral QHS Alee Gressman, Harrold Donath, MD   1 mg  at 12/16/21 2114   hydrOXYzine (ATARAX) tablet 25 mg  25 mg Oral TID PRN Cecilie Lowers, FNP   25 mg at 12/16/21 2114   magnesium hydroxide (MILK OF MAGNESIA) suspension 30 mL  30 mL Oral Daily PRN Ntuen, Jesusita Oka, FNP       traZODone (DESYREL) tablet 50 mg  50 mg Oral QHS PRN Cecilie Lowers, FNP        Lab Results:  Results for orders placed or performed during the hospital encounter of 12/15/21 (from the past 48 hour(s))  CBC     Status: None   Collection Time: 12/16/21  6:39 AM  Result Value Ref Range   WBC 8.2 4.0 - 10.5 K/uL   RBC 4.94 3.87 - 5.11 MIL/uL   Hemoglobin 14.7 12.0 - 15.0 g/dL   HCT 09.3 23.5 - 57.3 %   MCV 90.5 80.0 - 100.0 fL   MCH 29.8 26.0 - 34.0 pg   MCHC 32.9 30.0 - 36.0 g/dL   RDW 22.0 25.4 - 27.0 %   Platelets 225 150 - 400 K/uL   nRBC 0.0 0.0 - 0.2 %    Comment: Performed at [facility], [address], [place], [place] [address]  Comprehensive metabolic panel     Status: None   Collection Time: 12/16/21  6:39 AM  Result Value Ref Range   Sodium 140 135 - 145 mmol/L   Potassium 4.1 3.5 - 5.1 mmol/L   Chloride 106 98 - 111 mmol/L   CO2 26 22 - 32 mmol/L   Glucose, Bld 92 70 - 99 mg/dL    Comment: Glucose reference range applies only to samples taken after fasting for at least 8 hours.   BUN 11 6 - 20 mg/dL   Creatinine, Ser 2.83 0.44 - 1.00 mg/dL   Calcium 9.3 8.9 - 15.1 mg/dL   Total Protein 7.3 6.5 - 8.1 g/dL   Albumin 4.1 3.5 - 5.0 g/dL   AST 18 15 - 41 U/L   ALT 17 0 - 44 U/L   Alkaline Phosphatase 70 38 - 126 U/L   Total Bilirubin 0.6 0.3 - 1.2 mg/dL   GFR, Estimated >76 >16 mL/min    Comment: (NOTE) Calculated using the CKD-EPI Creatinine Equation (2021)    Anion gap 8 5 - 15    Comment: Performed at [facility], [address], [place], [place] [address]  Hemoglobin A1c     Status: None   Collection Time: 12/16/21  6:39 AM  Result Value Ref Range   Hgb A1c MFr Bld 4.8 4.8 - 5.6 %    Comment:  (NOTE) Pre diabetes:          5.7%-6.4%  Diabetes:              >  6.4%  Glycemic control for   <7.0% adults with diabetes    Mean Plasma Glucose 91.06 mg/dL    Comment: Performed at [facility], [address], [place], [place] [address]  Lipid panel     Status: Abnormal   Collection Time: 12/16/21  6:39 AM  Result Value Ref Range   Cholesterol 198 0 - 200 mg/dL   Triglycerides 61 <604 mg/dL   HDL 63 >54 mg/dL   Total CHOL/HDL Ratio 3.1 RATIO   VLDL 12 0 - 40 mg/dL   LDL Cholesterol 098 (H) 0 - 99 mg/dL    Comment:        Total Cholesterol/HDL:CHD Risk Coronary Heart Disease Risk Table                     Men   Women  1/2 Average Risk   3.4   3.3  Average Risk       5.0   4.4  2 X Average Risk   9.6   7.1  3 X Average Risk  23.4   11.0        Use the calculated Patient Ratio above and the CHD Risk Table to determine the patient's CHD Risk.        ATP III CLASSIFICATION (LDL):  <100     mg/dL   Optimal  119-147  mg/dL   Near or Above                    Optimal  130-159  mg/dL   Borderline  829-562  mg/dL   High  >130     mg/dL   Very High Performed at [facility], [address], [place], [place] [address]   TSH     Status: None   Collection Time: 12/16/21  6:39 AM  Result Value Ref Range   TSH 3.132 0.350 - 4.500 uIU/mL    Comment: Performed by a 3rd Generation assay with a functional sensitivity of <=0.01 uIU/mL. Performed at [facility], [address] [place], [place] [address]   Urinalysis, Complete w Microscopic Urine, Clean Catch     Status: Abnormal   Collection Time: 12/16/21  7:44 PM  Result Value Ref Range   Color, Urine STRAW (A) YELLOW   APPearance CLEAR CLEAR   Specific Gravity, Urine 1.006 1.005 - 1.030   pH 6.0 5.0 - 8.0   Glucose, UA NEGATIVE NEGATIVE mg/dL   Hgb urine dipstick NEGATIVE NEGATIVE   Bilirubin Urine NEGATIVE NEGATIVE   Ketones, ur NEGATIVE NEGATIVE mg/dL   Protein, ur NEGATIVE  NEGATIVE mg/dL   Nitrite NEGATIVE NEGATIVE   Leukocytes,Ua TRACE (A) NEGATIVE   RBC / HPF 0-5 0 - 5 RBC/hpf   WBC, UA 0-5 0 - 5 WBC/hpf   Bacteria, UA RARE (A) NONE SEEN   Squamous Epithelial / LPF 0-5 0 - 5   Mucus PRESENT     Comment: Performed at [facility], [address], [place], [place] [address]  Pregnancy, urine     Status: None   Collection Time: 12/16/21  7:44 PM  Result Value Ref Range   Preg Test, Ur NEGATIVE NEGATIVE    Comment:        THE SENSITIVITY OF THIS METHODOLOGY IS >20 mIU/mL. Performed at [facility], [address], [place], [place] [address]   Rapid urine drug screen (hospital performed) not at [facility]     Status:  None   Collection Time: 12/16/21  7:44 PM  Result Value Ref Range   Opiates NONE DETECTED NONE DETECTED   Cocaine NONE DETECTED NONE DETECTED   Benzodiazepines NONE DETECTED NONE DETECTED   Amphetamines NONE DETECTED NONE DETECTED   Tetrahydrocannabinol NONE DETECTED NONE DETECTED   Barbiturates NONE DETECTED NONE DETECTED    Comment: (NOTE) DRUG SCREEN FOR MEDICAL PURPOSES ONLY.  IF CONFIRMATION IS NEEDED FOR ANY PURPOSE, NOTIFY LAB WITHIN 5 DAYS.  LOWEST DETECTABLE LIMITS FOR URINE DRUG SCREEN Drug Class                     Cutoff (ng/mL) Amphetamine and metabolites    1000 Barbiturate and metabolites    200 Benzodiazepine                 200 Tricyclics and metabolites     300 Opiates and metabolites        300 Cocaine and metabolites        300 THC                            50 Performed at [facility], [address] [place], [place] [address]     Blood Alcohol level:  Lab Results  Component Value Date   ETH <10 12/14/2021    Metabolic Disorder Labs: Lab Results  Component Value Date   HGBA1C 4.8 12/16/2021   MPG 91.06 12/16/2021   No results found for: "PROLACTIN" Lab Results  Component Value Date   CHOL 198 12/16/2021   TRIG 61 12/16/2021   HDL 63  12/16/2021   CHOLHDL 3.1 12/16/2021   VLDL 12 12/16/2021   LDLCALC 123 (H) 12/16/2021    Physical Findings: AIMS:  , ,  ,  ,    CIWA:    COWS:     Musculoskeletal: Strength & Muscle Tone: within normal limits Gait & Station: normal Patient leans: N/A  Psychiatric Specialty Exam:  Presentation  General Appearance: Casual  Eye Contact:Good  Speech:Normal Rate  Speech Volume:Normal  Handedness:Right   Mood and Affect  Mood:Anxious; Dysphoric; Irritable  Affect:Appropriate; Congruent; Full Range   Thought Process  Thought Processes:Linear  Descriptions of Associations:Circumstantial  Orientation:Full (Time, Place and Person)  Thought Content:Logical; Rumination  History of Schizophrenia/Schizoaffective disorder:No  Duration of Psychotic Symptoms:No data recorded Hallucinations:Hallucinations: None  Ideas of Reference:None  Suicidal Thoughts:Suicidal Thoughts:  +SI last night   Homicidal Thoughts:Homicidal Thoughts: No   Sensorium  Memory:Immediate Good; Recent Good; Remote Good  Judgment:Impaired  Insight:Lacking   Executive Functions  Concentration:Fair  Attention Span:Fair  Recall:-- (Not formally assessed)  Fund of Knowledge:-- (Not formally assessed)  Language:Good   Psychomotor Activity  Psychomotor Activity:Psychomotor Activity: Normal   Assets  Assets:Communication Skills; Desire for Improvement; Leisure Time; Physical Health; Resilience; Social Support   Sleep  Sleep:Sleep: Poor    Physical Exam: Physical Exam Constitutional:      General: She is not in acute distress.    Appearance: She is not ill-appearing.  HENT:     Head: Normocephalic and atraumatic.  Pulmonary:     Effort: Pulmonary effort is normal. No respiratory distress.  Neurological:     Mental Status: She is alert and oriented to person, place, and time.    Review of Systems  Constitutional:  Negative for chills, diaphoresis and fever.  Eyes:   Negative for blurred vision and double vision.  Respiratory:  Negative for cough  and stridor.   Cardiovascular:  Negative for chest pain and palpitations.  Gastrointestinal:  Negative for constipation, nausea and vomiting.  Musculoskeletal:  Negative for myalgias.  Neurological:  Negative for dizziness, tremors, seizures and headaches.  Psychiatric/Behavioral:  Positive for depression. Negative for hallucinations and suicidal ideas. The patient is nervous/anxious.    Blood pressure 114/63, pulse 80, temperature 97.6 F (36.4 C), temperature source Oral, resp. rate 16, height  (1.6 m), weight 78 kg, SpO2 100 %. Body mass index is 30.47 kg/m.   Treatment Plan Summary: Daily contact with patient to assess and evaluate symptoms and progress in treatment  ASSESSMENT:   Diagnoses / Active Problems: -MDD severe recurrent without psychosis -GAD with panic attacks -ADHD Rule out cannabis use disorder Rule out alcohol use disorder History of epilepsy +/-pseudoseizures ?  H[place] disease risk on genetic testing   PLAN: Safety and Monitoring:             -- Involuntary admission to inpatient psychiatric unit for safety, stabilization and treatment             -- Daily contact with patient to assess and evaluate symptoms and progress in treatment             -- Patient's case to be discussed in multi-disciplinary team meeting             -- Observation Level : q15 minute checks             -- Vital signs:  q12 hours             -- Precautions: suicide, elopement, and assault   2. Psychiatric Diagnoses and Treatment:               -Continue Lexapro 20 mg once a day for MDD and GAD -Continue BuSpar 10 mg twice daily -Continue guanfacine 1 mg nightly for ADHD and impulse control -Start Keppra 500 mg twice daily for seizure disorder -Re-start Mydayis 25 mg qam for ADHD tomorrow in AM - Unfortunately, this medication (or any approximate substitute) is unavailable within the formulary  at this treatment setting. However, patient anticipates that her friend will be able to get the prescription to her later tonight. Untreated ADHD is the most significant contributor to patient's current presentation; therefore, it is important to continue to monitor for clinical improvement after re-initiating Mydayis 25 mg before patient could be appropriate for discharge.  -F/u EKG (already ordered)   --  The risks/benefits/side-effects/alternatives to this medication were discussed in detail with the patient and time was given for questions. The patient consents to medication trial.              -- Metabolic profile and EKG monitoring obtained while on an atypical antipsychotic (BMI: Lipid Panel: HbgA1c: QTc:)              -- Encouraged patient to participate in unit milieu and in scheduled group therapies    3. Medical Issues Being Addressed:               Tobacco Use Disorder             -- Nicotine patch gum prn   Historical dx of Unspecified Seizure Disorder -start keppra for h/o seizure d/o and recent seizure like activity. Pt will need to f/u with neurologist  -will recommend not driving until neurologist eval, at dc   4. Discharge Planning:              --  Social work and case management to assist with discharge planning and identification of hospital follow-up needs prior to discharge             -- Estimated LOS: 5-7 days             -- Discharge Concerns: Need to establish a safety plan; Medication compliance and effectiveness             -- Discharge Goals: Return home with outpatient referrals for mental health follow-up including medication management/psychotherapy    Illene Regulus, MS4, Rosana Berger  Total Time Spent in Direct Patient Care:  I personally spent 35 minutes on the unit in direct patient care. The direct patient care time included face-to-face time with the patient, reviewing the patient's chart, communicating with other professionals, and coordinating care. Greater  than 50% of this time was spent in counseling or coordinating care with the patient regarding goals of hospitalization, psycho-education, and discharge planning needs.  I personally was present and performed or re-performed the history, physical exam and medical decision-making activities of this service and have verified that the service and findings are accurately documented in the student's note, , as addended by me or notated below:  I directly edited the note, as above.   Phineas Inches, MD Psychiatrist

## 2021-12-17 NOTE — Group Note (Signed)
Date:  12/17/2021 Time:  11:42 AM  Group Topic/Focus:  Orientation:   The focus of this group is to educate the patient on the purpose and policies of crisis stabilization and provide a format to answer questions about their admission.  The group details unit policies and expectations of patients while admitted.    Participation Level:  Active  Participation Quality:  Appropriate  Affect:  Appropriate  Cognitive:  Appropriate  Insight: Appropriate  Engagement in Group:  Engaged  Modes of Intervention:  Discussion  Additional Comments:     Reymundo Poll 12/17/2021, 11:42 AM

## 2021-12-17 NOTE — Progress Notes (Signed)
     12/16/21 2114  Psych Admission Type (Psych Patients Only)  Admission Status Involuntary  Psychosocial Assessment  Patient Complaints Anxiety;Depression  Eye Contact Brief  Facial Expression Anxious  Affect Appropriate to circumstance  Speech Logical/coherent  Interaction Assertive  Motor Activity Other (Comment) (WNL)  Appearance/Hygiene Unremarkable  Behavior Characteristics Cooperative  Mood Pleasant;Depressed;Labile  Thought Process  Coherency WDL  Content WDL  Delusions None reported or observed  Perception WDL  Hallucination None reported or observed  Judgment Poor  Confusion WDL  Danger to Self  Current suicidal ideation? Passive  Agreement Not to Harm Self Yes  Description of Agreement verbal contract  Danger to Others  Danger to Others None reported or observed

## 2021-12-18 MED ORDER — AMPHETAMINE-DEXTROAMPHET ER 10 MG PO CP24
20.0000 mg | ORAL_CAPSULE | Freq: Every day | ORAL | Status: DC
  Administered 2021-12-18 – 2021-12-22 (×5): 20 mg via ORAL
  Filled 2021-12-18 (×5): qty 2

## 2021-12-18 NOTE — [facility] Group Notes (Signed)
Pt did attend AA meeting 

## 2021-12-18 NOTE — Progress Notes (Signed)
   12/18/21 1100  Psych Admission Type (Psych Patients Only)  Admission Status Involuntary  Psychosocial Assessment  Patient Complaints Anxiety  Eye Contact Brief  Facial Expression Anxious  Affect Appropriate to circumstance  Speech Logical/coherent  Interaction Assertive  Motor Activity Fidgety  Appearance/Hygiene Revealing clothes/seductive clothing  Behavior Characteristics Cooperative  Mood Depressed;Anxious  Aggressive Behavior  Effect No apparent injury  Thought Process  Coherency WDL  Content WDL  Delusions None reported or observed  Perception WDL  Hallucination None reported or observed  Judgment WDL  Confusion WDL  Danger to Self  Current suicidal ideation? Denies  Agreement Not to Harm Self Yes  Description of Agreement Verbal  Danger to Others  Danger to Others None reported or observed

## 2021-12-18 NOTE — Progress Notes (Signed)
Adult Psychoeducational Group Note  Date:  12/18/2021 Time:  10:09 AM  Group Topic/Focus:  Goals Group:   The focus of this group is to help patients establish daily goals to achieve during treatment and discuss how the patient can incorporate goal setting into their daily lives to aide in recovery.  Participation Level:  Active  Participation Quality:  Appropriate  Affect:  Appropriate  Cognitive:  Appropriate  Insight: Appropriate  Engagement in Group:  Engaged  Modes of Intervention:  Discussion  Additional Comments:  [name] attended group  Melinda Mills 12/18/2021, 10:09 AM

## 2021-12-18 NOTE — Progress Notes (Signed)
[facility] MD Progress Note  12/18/2021 9:32 AM Melinda Mills  MRN:  [id number]  Subjective:  Patient is a 31 year-old woman with a reported past psychiatric history of MDD, anxiety, and ADHD, who was brought to the ED by a concerned friend after the friend was informed that the patient had prepared suicide notes for her family to find the next day. She was placed under involuntary commitment status due to suicidal ideation with intention and plan to overdose on pills.  Yesterday, the psychiatry team made following recommendations: -Start Keppra 500 mg twice daily for seizure disorder -Continue Lexapro 20 mg once a day for MDD and GAD -Continue BuSpar 10 mg twice daily -Continue guanfacine 1 mg nightly for ADHD and impulse control -Restart Mydayis 25 mg qam for ADHD  Interval History: PRN Medications administered within the last 24 hours: [Atarax available q8h for anxiety taken x1 for sleep]  Per Patient:  On assessment patient was evaluated at bedside. She denies SI over the previous day despite significant stress attributed to frustration with her parents and recently estranged husband yesterday during discussions around discharge planning. Her mood is less depressed and anxious than previous day. Patient endorses good sleep quality and normal appetite. She describes a slight decrease in energy during the day and especially in the morning which she believes is secondary to taking hydroxyzine so late at night (after being offered a lower dose of hydroxyzine in previous days). Also, she has taken Keppra in the past and it also caused a mild fatigue during the first week. She rates current anxiety at 5/10 which is an improvement from her baseline prior to admission.Throughout interview patient is talkative and often tangential, ruminating on dynamics with family members and her frustration about them. She feels somewhat less impulsive today but still endorses problems prioritizing and low frustration  tolerance.  Yesterday, the patient had arranged for her home Mydayis to be picked up and delivered. Unfortunately, the friend she sent was unable to use her pharmacy benefits to decrease the price from $350 to $30, and the patient was unable to receive prescribed Mydayis 25 mg daily (but does have higher dose prescribed prior to initialization of guanfacine with outside clinician). The patient understands the high relevance of ADHD control to current decompensation and is agreeable to equivalent doses of similar stimulant to evaluate for improvement before being appropriate for discharge.  Patient denies current SI, HI, and AVH. Patient denies side effects to current scheduled psychiatric medications.   Patient denies any somatic complaints.    Principal Problem: MDD (major depressive disorder), recurrent, severe, with psychosis (HCC) Diagnosis: Principal Problem:   MDD (major depressive disorder), recurrent, severe, with psychosis (HCC) Active Problems:   History of seizure   GAD (generalized anxiety disorder)   ADHD (attention deficit hyperactivity disorder), combined type   Cannabis use, uncomplicated   Past Psychiatric History: Historical Psychiatric Diagnoses: MDD diagnosed in first year of college (2012), GAD with peripartum onset (2019), and ADHD which was diagnosed in childhood. Also, patient was formally evaluated and re-diagnosed with ADHD in 2020. She denies previous psychiatric hospitalization  Current Psychiatric Treatment: Patient uses [employer] chat feature for spot therapy needs. Her outpatient psychiatric regimen is managed by [facility]  Psychiatric Medication History: Patient was treated for MDD with Zoloft in college but stopped at graduation due to inconvenience/lack of efficacy. Her ADHD was treated with methylphenidate in childhood. Current regimen includes Lexapro added for MDD/GAD in 2020 with Buspar added to her regimen  in 2021 for  anxiety. After ADHD re-diagnosis in 2020 she failed trials of Adderall ER and methylphenidate and has been maintained on Mydayis 37.5 mg daily. Two weeks prior to admission, home Psychiatrist modified ADHD treatment by decreasing Mydayis to 25 mg daily and adding Guanfacine 1 mg daily to control for symptoms of agitation and impulsivity  Psychiatric Medication Compliance History: Patient reports stopping all medications at the beginning of June because she wanted to see if she could manage her diagnoses with non-pharmacologic intervention alone. However, family and friends noted differences including increased agitation, impulsivity, and distractibility and with their encouragement the patient restarted Buspar and Lexapro approximately 4-5 weeks later at the beginning of July. 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  Patient reports that 1 time in college she drank excessive alcohol and intent to harm herself and overdose.  She denies other suicide attempts in the past.  Past Medical History:  Past Medical History:  Diagnosis Date   ADHD (attention deficit hyperactivity disorder)    Depression    Seizures (HCC)     Past Surgical History:  Procedure Laterality Date   APPENDECTOMY  2008/2009   Family History:  Family History  Adopted: Yes   Family Psychiatric  History: Patient denies any known family history of psychiatric illness.   Social History:  Social History   Substance and Sexual Activity  Alcohol Use Not Currently     Social History   Substance and Sexual Activity  Drug Use Not Currently   Types: Marijuana    Social History   Socioeconomic History   Marital status: Legally Separated    Spouse name: Not on file   Number of children: 1   Years of education: Not on file   Highest education level: Bachelor's degree (e.g., BA, AB, BS)  Occupational History    Not on file  Tobacco Use   Smoking status: Never   Smokeless tobacco: Never  Vaping Use   Vaping Use: Never used  Substance and Sexual Activity   Alcohol use: Not Currently   Drug use: Not Currently    Types: Marijuana   Sexual activity: Yes  Other Topics Concern   Not on file  Social History Narrative   Not on file   Social Determinants of Health   Financial Resource Strain: Low Risk  (08/15/2018)   Overall Financial Resource Strain (CARDIA)    Difficulty of Paying Living Expenses: Not very hard  Food Insecurity: No Food Insecurity (02/09/2018)   Hunger Vital Sign    Worried About Running Out of Food in the Last Year: Never true    Ran Out of Food in the Last Year: Never true  Transportation Needs: No Transportation Needs (02/09/2018)   PRAPARE - Administrator, Civil Service (Medical): No    Lack of Transportation (Non-Medical): No  Physical Activity: Inactive (02/09/2018)   Exercise Vital Sign    Days of Exercise per Week: 0 days    Minutes of Exercise per Session: 0 min  Stress: No Stress Concern Present (02/09/2018)   [employer] of Occupational Health - Occupational Stress Questionnaire    Feeling of Stress : Not at all  Social Connections: Unknown (08/15/2018)   Social Connection and Isolation Panel [NHANES]    Frequency of Communication with Friends and Family: Not on file    Frequency of Social Gatherings with Friends and Family:  Not on file    Attends Religious Services: Not on file    Active Member of Clubs or Organizations: Not on file    Attends Club or Organization Meetings: Not on file    Marital Status: Living with partner    Sleep: Fair  Appetite:  Fair  Current Medications: Current Facility-Administered Medications  Medication Dose Route Frequency Provider Last Rate Last Admin   acetaminophen (TYLENOL) tablet 650 mg  650 mg Oral Q6H PRN Ntuen, Jesusita Oka, FNP       alum & mag hydroxide-simeth (MAALOX/MYLANTA) 200-200-20 MG/5ML suspension  30 mL  30 mL Oral Q4H PRN Ntuen, Jesusita Oka, FNP       Amphet-Dextroamphet 3-Bead ER 25 MG CP24 25 mg  25 mg Oral Daily Verdie Barrows, MD       amphetamine-dextroamphetamine (ADDERALL XR) 24 hr capsule 20 mg  20 mg Oral Daily Mayda Shippee, MD       busPIRone (BUSPAR) tablet 10 mg  10 mg Oral BID Rue Tinnel, Harrold Donath, MD   10 mg at 12/18/21 0818   escitalopram (LEXAPRO) tablet 20 mg  20 mg Oral Daily Sage Kopera, Harrold Donath, MD   20 mg at 12/18/21 0818   guanFACINE (TENEX) tablet 1 mg  1 mg Oral QHS Nechelle Petrizzo, Harrold Donath, MD   1 mg at 12/17/21 2100   hydrOXYzine (ATARAX) tablet 25 mg  25 mg Oral TID PRN Cecilie Lowers, FNP   25 mg at 12/17/21 2100   levETIRAcetam (KEPPRA) tablet 500 mg  500 mg Oral BID Tewana Bohlen, Harrold Donath, MD   500 mg at 12/18/21 0818   magnesium hydroxide (MILK OF MAGNESIA) suspension 30 mL  30 mL Oral Daily PRN Ntuen, Jesusita Oka, FNP       traZODone (DESYREL) tablet 50 mg  50 mg Oral QHS PRN Ntuen, Jesusita Oka, FNP        Lab Results:  Results for orders placed or performed during the hospital encounter of 12/15/21 (from the past 48 hour(s))  Urinalysis, Complete w Microscopic Urine, Clean Catch     Status: Abnormal   Collection Time: 12/16/21  7:44 PM  Result Value Ref Range   Color, Urine STRAW (A) YELLOW   APPearance CLEAR CLEAR   Specific Gravity, Urine 1.006 1.005 - 1.030   pH 6.0 5.0 - 8.0   Glucose, UA NEGATIVE NEGATIVE mg/dL   Hgb urine dipstick NEGATIVE NEGATIVE   Bilirubin Urine NEGATIVE NEGATIVE   Ketones, ur NEGATIVE NEGATIVE mg/dL   Protein, ur NEGATIVE NEGATIVE mg/dL   Nitrite NEGATIVE NEGATIVE   Leukocytes,Ua TRACE (A) NEGATIVE   RBC / HPF 0-5 0 - 5 RBC/hpf   WBC, UA 0-5 0 - 5 WBC/hpf   Bacteria, UA RARE (A) NONE SEEN   Squamous Epithelial / LPF 0-5 0 - 5   Mucus PRESENT     Comment: Performed at [facility], [address], [place], [place] [address]  Pregnancy, urine     Status: None   Collection Time: 12/16/21  7:44 PM  Result Value Ref  Range   Preg Test, Ur NEGATIVE NEGATIVE    Comment:        THE SENSITIVITY OF THIS METHODOLOGY IS >20 mIU/mL. Performed at [facility], [address], [place], [place] [address]   Rapid urine drug screen (hospital performed) not at [facility]     Status: None   Collection Time: 12/16/21  7:44 PM  Result Value Ref Range   Opiates NONE DETECTED NONE DETECTED  Cocaine NONE DETECTED NONE DETECTED   Benzodiazepines NONE DETECTED NONE DETECTED   Amphetamines NONE DETECTED NONE DETECTED   Tetrahydrocannabinol NONE DETECTED NONE DETECTED   Barbiturates NONE DETECTED NONE DETECTED    Comment: (NOTE) DRUG SCREEN FOR MEDICAL PURPOSES ONLY.  IF CONFIRMATION IS NEEDED FOR ANY PURPOSE, NOTIFY LAB WITHIN 5 DAYS.  LOWEST DETECTABLE LIMITS FOR URINE DRUG SCREEN Drug Class                     Cutoff (ng/mL) Amphetamine and metabolites    1000 Barbiturate and metabolites    200 Benzodiazepine                 200 Tricyclics and metabolites     300 Opiates and metabolites        300 Cocaine and metabolites        300 THC                            50 Performed at [facility], [address] [place], [place] [address]     Blood Alcohol level:  Lab Results  Component Value Date   ETH <10 12/14/2021    Metabolic Disorder Labs: Lab Results  Component Value Date   HGBA1C 4.8 12/16/2021   MPG 91.06 12/16/2021   No results found for: "PROLACTIN" Lab Results  Component Value Date   CHOL 198 12/16/2021   TRIG 61 12/16/2021   HDL 63 12/16/2021   CHOLHDL 3.1 12/16/2021   VLDL 12 12/16/2021   LDLCALC 123 (H) 12/16/2021    Musculoskeletal: Strength & Muscle Tone: within normal limits Gait & Station: normal Patient leans: N/A  Psychiatric Specialty Exam:   Presentation  General Appearance: Casual  Eye Contact:Good  Speech:Normal Rate  Speech Volume:Normal  Handedness:Right   Mood and Affect  Mood:Anxious; Dysphoric;  Irritable  Affect:Appropriate; Congruent; Full Range   Thought Process  Thought Processes:Linear  Descriptions of Associations:Circumstantial  Orientation:Full (Time, Place and Person)  Thought Content:Logical; Rumination  History of Schizophrenia/Schizoaffective disorder:No  Duration of Psychotic Symptoms:No data recorded Hallucinations:Hallucinations: None  Ideas of Reference:None  Suicidal Thoughts:Suicidal Thoughts:  +SI last night   Homicidal Thoughts:Homicidal Thoughts: No   Sensorium  Memory:Immediate Good; Recent Good; Remote Good  Judgment:Impaired  Insight:Lacking   Executive Functions  Concentration:Fair  Attention Span:Fair  Recall:-- (Not formally assessed)  Fund of Knowledge:-- (Not formally assessed)  Language:Good   Psychomotor Activity  Psychomotor Activity:Psychomotor Activity: Normal   Assets  Assets:Communication Skills; Desire for Improvement; Leisure Time; Physical Health; Resilience; Social Support   Sleep  Sleep:Sleep: Poor    Physical Exam: Physical Exam Vitals reviewed.  Constitutional:      General: She is not in acute distress.    Appearance: She is not ill-appearing.  HENT:     Head: Normocephalic and atraumatic.  Pulmonary:     Effort: Pulmonary effort is normal. No respiratory distress.  Neurological:     Mental Status: She is alert and oriented to person, place, and time.    Review of Systems  Constitutional:  Negative for chills, diaphoresis and fever.  HENT:  Negative for congestion.   Eyes:  Negative for blurred vision and double vision.  Respiratory:  Negative for cough.   Cardiovascular:  Negative for chest pain and palpitations.  Gastrointestinal:  Negative for constipation, diarrhea, nausea and vomiting.  Musculoskeletal:  Negative for myalgias.  Neurological:  Negative for dizziness, tremors, seizures and  headaches.  Psychiatric/Behavioral:  Positive for depression. Negative for hallucinations  and suicidal ideas. The patient is nervous/anxious.    Blood pressure 94/60, pulse 79, temperature 98 F (36.7 C), temperature source Oral, resp. rate 16, height  (1.6 m), weight 78 kg, SpO2 100 %. Body mass index is 30.47 kg/m.   Treatment Plan Summary: Daily contact with patient to assess and evaluate symptoms and progress in treatment  ASSESSMENT:   Diagnoses / Active Problems: -MDD severe recurrent without psychosis -GAD with panic attacks -ADHD Rule out cannabis use disorder Rule out alcohol use disorder History of epilepsy +/-pseudoseizures ?  H[place] disease risk on genetic testing   PLAN: Safety and Monitoring:             -- Involuntary admission to inpatient psychiatric unit for safety, stabilization and treatment             -- Daily contact with patient to assess and evaluate symptoms and progress in treatment             -- Patient's case to be discussed in multi-disciplinary team meeting             -- Observation Level : q15 minute checks             -- Vital signs:  q12 hours             -- Precautions: suicide, elopement, and assault   2. Psychiatric Diagnoses and Treatment:               -Continue Lexapro 20 mg once a day for MDD and GAD -Continue BuSpar 10 mg twice daily -Continue guanfacine 1 mg nightly for ADHD and impulse control -Continue Keppra 500 mg twice daily for seizure disorder -Start Adderall XR 20 mg daily. Untreated ADHD is the most significant contributor to patient's current presentation. Unfortunately, patient does not have access to home Mydayis 25 mg during admission. A similar stimulant medication was chosen so that the patient could be evaluated as appropriate for discharge.  -F/u EKG (already ordered)   --  The risks/benefits/side-effects/alternatives to this medication were discussed in detail with the patient and time was given for questions. The patient consents to medication trial.              -- Metabolic profile and EKG  monitoring obtained while on an atypical antipsychotic (BMI: Lipid Panel: HbgA1c: QTc:)              -- Encouraged patient to participate in unit milieu and in scheduled group therapies    3. Medical Issues Being Addressed:               Tobacco Use Disorder             -- Nicotine patch gum prn   Historical dx of Unspecified Seizure Disorder -Continue Keppra for h/o seizure d/o and recent seizure like activity. Pt will need to f/u with neurologist  -will recommend not driving until neurologist eval, at dc   4. Discharge Planning:              -- Social work and case management to assist with discharge planning and identification of hospital follow-up needs prior to discharge             -- Estimated LOS: 5-7 days             -- Discharge Concerns: Need to establish a safety plan; Medication compliance and effectiveness             --  Discharge Goals: Return home with outpatient referrals for mental health follow-up including medication management/psychotherapy  Illene Regulus, MS4, Rosana Berger  Total Time Spent in Direct Patient Care:  I personally spent 35 minutes on the unit in direct patient care. The direct patient care time included face-to-face time with the patient, reviewing the patient's chart, communicating with other professionals, and coordinating care. Greater than 50% of this time was spent in counseling or coordinating care with the patient regarding goals of hospitalization, psycho-education, and discharge planning needs.  I personally was present and performed or re-performed the history, physical exam and medical decision-making activities of this service and have verified that the service and findings are accurately documented in the student's note, , as addended by me or notated below:  I agree with the note and plan. We will monitor patient for response in target symptoms after restarting ADHD medication. Also monitor for any affective switching unmasked by  stimulant.  Phineas Inches, MD Psychiatrist

## 2021-12-18 NOTE — Group Note (Signed)
Recreation Therapy Group Note   Group Topic:Team Building  Group Date: 12/18/2021 Start Time: 0930 End Time: 0955 Facilitators: Caroll Rancher, LRT,CTRS Location: [address]   Recreation Therapy Notes  Goal Area(s) Addresses:  Patient will effectively work with peer towards shared goal.  Patient will identify skills used to make activity successful.  Patient will identify how skills used during activity can be applied to reach post d/c goals.   Group Description:  [employer]. In teams of 5-6, patients were given 11 craft pipe cleaners. Using the materials provided, patients were instructed to compete again the opposing team(s) to build the tallest free-standing structure from floor level. The activity was timed; difficulty increased by Clinical research associate as Production designer, theatre/television/film continued.  Systematically resources were removed with additional directions for example, placing one arm behind their back, working in silence, and shape stipulations. LRT facilitated post-activity discussion reviewing team processes and necessary communication skills involved in completion. Patients were encouraged to reflect how the skills utilized, or not utilized, in this activity can be incorporated to positively impact support systems post discharge.   Affect/Mood: Appropriate   Participation Level: Engaged   Participation Quality: Independent   Behavior: Appropriate   Speech/Thought Process: Focused   Insight: Good   Judgement: Good   Modes of Intervention: Team-building   Patient Response to Interventions:  Engaged   Education Outcome:  Acknowledges education and In group clarification offered    Clinical Observations/Individualized Feedback: Pt attended and participated in group.    Plan: Continue to engage patient in RT group sessions 2-3x/week.   Caroll Rancher, LRT,CTRS 12/18/2021 1:11 PM

## 2021-12-18 NOTE — Group Note (Signed)
LCSW Group Therapy Note   Group Date: 12/18/2021 Start Time: 1300 End Time: 1400  Type of Therapy and Topic: Group Therapy: Control  Participation Level: Active  Description of Group: In this group patients will discuss what is out of their control, what is somewhat in their control, and what is within their control.  They will be encouraged to explore what issues they can control and what issues are out of their control within their daily lives. They will be guided to discuss their thoughts, feelings, and behaviors related to these issues. The group will process together ways to better control things that are well within our own control and how to notice and accept the things that are not within our control. This group will be process-oriented, with patients participating in exploration of their own experiences as well as giving and receiving support and challenge from other group members.  During this group 2 worksheets will be provided to each patient to follow along and fill out.   Therapeutic Goals: 1. Patient will identify what is within their control and what is not within their control. 2. Patient will identify their thoughts and feelings about having control over their own lives. 3. Patient will identify their thoughts and feelings about not having control over everything in their lives.. 4. Patient will identify ways that they can have more control over their own lives. 5. Patient will identify areas were they can allow others to help them or provide assistance.  Summary of Patient Progress: The Pt attended group and remained there the entire time.  The Pt accepted all worksheets and followed along throughout the group session.  The Pt was appropriate with their peers and demonstrated understanding of the topic being discussed.  The Pt was able to identify areas of no control and how they could break those areas down into smaller pieces to find where they had some control in the  situation.    Aram Beecham, LCSWA 12/18/2021  2:20 PM

## 2021-12-18 NOTE — Progress Notes (Signed)

## 2021-12-19 DIAGNOSIS — F333 Major depressive disorder, recurrent, severe with psychotic symptoms: Principal | ICD-10-CM

## 2021-12-19 NOTE — Progress Notes (Signed)
EKG results placed on the outside of shadow chart  Normal Sinus Rhythm Normal EKG  QT/QTC= 378/439 ms

## 2021-12-19 NOTE — Progress Notes (Signed)
   12/19/21 2200  Psych Admission Type (Psych Patients Only)  Admission Status Involuntary  Psychosocial Assessment  Patient Complaints Anxiety  Eye Contact Brief  Facial Expression Anxious  Affect Appropriate to circumstance  Speech Logical/coherent  Interaction Assertive  Motor Activity Restless  Appearance/Hygiene Unremarkable  Behavior Characteristics Cooperative;Appropriate to situation  Mood Anxious;Pleasant  Thought Process  Coherency WDL  Content WDL  Delusions None reported or observed  Perception WDL  Hallucination None reported or observed  Judgment Impaired  Confusion None  Danger to Self  Current suicidal ideation? Denies

## 2021-12-19 NOTE — [facility] Group Notes (Signed)
Adult Psychoeducational Group Note  Date:  12/19/2021 Time:  3:05 PM  Group Topic/Focus:  Coping With Mental Health Crisis:   The purpose of this group is to help patients identify strategies for coping with mental health crisis.  Group discusses possible causes of crisis and ways to manage them effectively.  Participation Level:  Active  Participation Quality:  Appropriate  Affect:  Appropriate  Cognitive:  Appropriate  Insight: Appropriate  Engagement in Group:  Engaged  Modes of Intervention:  Discussion  Additional Comments: Patient did not attend afternoon MHT lead therapeutic group.    Searcy Miyoshi W Atarah Cadogan 12/19/2021, 3:05 PM

## 2021-12-19 NOTE — Progress Notes (Signed)

## 2021-12-19 NOTE — [facility] Group Notes (Signed)
[facility] Group Notes:  (Nursing/MHT/Case Management/Adjunct)  Date:  12/19/2021  Time:  9:23 AM  Type of Therapy:   Orientation/ goals group  Participation Level:  Active  Participation Quality:  Appropriate  Affect:  Appropriate  Cognitive:  Alert  Insight:  Appropriate  Engagement in Group:  Engaged  Modes of Intervention:  Education  Summary of Progress/Problems: Pt. Attended and was engaged in group.   Fatima Blank 12/19/2021, 9:23 AM

## 2021-12-19 NOTE — Progress Notes (Signed)
   12/19/21 1141  Psych Admission Type (Psych Patients Only)  Admission Status Involuntary  Psychosocial Assessment  Patient Complaints Anxiety  Eye Contact Brief  Facial Expression Anxious  Affect Appropriate to circumstance  Speech Logical/coherent  Interaction Assertive  Motor Activity Fidgety  Appearance/Hygiene Unremarkable  Behavior Characteristics Cooperative  Mood Depressed;Anxious  Thought Process  Coherency WDL  Content WDL  Delusions None reported or observed  Perception WDL  Hallucination None reported or observed  Judgment WDL  Confusion WDL  Danger to Self  Current suicidal ideation? Denies  Agreement Not to Harm Self Yes  Description of Agreement VERBAL  Danger to Others  Danger to Others None reported or observed

## 2021-12-19 NOTE — Progress Notes (Signed)
[facility]  12/19/2021 6:34 PM Melinda Mills  MRN:  [id number]  Subjective: Melinda Mills states, "I feel much better because I am taking my medication as prescribed, and I believe it is helping me."  Brief History: Melinda OuHollie Howdyshell is a [age] year-old woman with a reported past psychiatric history of MDD, anxiety, and ADHD, who was brought to the ED by a concerned friend after the friend was informed that the patient had prepared suicide notes for her family to find the next day. She was placed under involuntary commitment status due to suicidal ideation with intention and plan to overdose on pills.   Yesterday, the psychiatry team made following recommendations: -Continue Lexapro 20 mg once a day for MDD and GAD -Increase BuSpar from 10 mg once daily to 10 mg twice daily -Continue guanfacine 1 mg nightly for ADHD and impulse control ?-Start Keppra 500 mg twice daily for seizure disorder -Restart Mydayis 25 mg qam for ADHD (Not restarted at this time due to   this medication (or any approximate substitute) is unavailable within the formulary at this treatment setting. However, patient anticipates that her friend will be able to get the prescription to her later during this admission.   On assessment today, patient is examined in the office.  Appears calm and cooperating with the exam.  Chart reviewed and findings shared with the treatment team and consulted with Dr. Abbott PaoAttiah.  Alert and oriented x 4 and able to respond appropriately to all the assessment questions.  Speech fluent and clear and able to maintain good eye contact during the assessment.  Mood is euthymic, and affect appropriate and congruent.  Thought process coherent and thought content logical.  Memory, judgment, and insight fair.  Actively participating in therapeutic milieu and group activities in the unit.  No routine labs noted at this time however UDS on 12/14/2021 positive for marijuana.  Patient denies suicidal ideations,  homicidal ideation, paranoia, auditory/visual hallucinations. Endorses sleeping for 7 hours last night and feeling restful. Endorses good appetite and drinking adequate fluid for hydration. Endorses less anxiety and rates as 2/10 on a scale of 0-10 10 being the worst. Reports less depression and rates as 0/10 on a scale of 0-10, with 10 being the worst. Reports taking medication as prescribed, without any adverse reactions.  Made patient aware that Mydayis 25 mg q am for ADHD (Not restarted at this time due to  this medication (or any approximate substitute) is unavailable within the formulary at this treatment setting. However, patient anticipates that her friend will be able to get the prescription to her later during this admission.   Principal Problem: MDD (major depressive disorder), recurrent, severe, with psychosis (HCC)  Diagnosis: Principal Problem:   MDD (major depressive disorder), recurrent, severe, with psychosis (HCC) Active Problems:   History of seizure   GAD (generalized anxiety disorder)   ADHD (attention deficit hyperactivity disorder), combined type   Cannabis use, uncomplicated  Total Time spent with patient: 30 minutes  Past Psychiatric History: Patient describes a historical diagnosis of ADHD from childhood which was re-evaluated and confirmed in 2020. Additionally, she was diagnosed with MDD during her first year of undergraduate (2012). She also describes significant anxiety and panic attacks which began shortly after the birth of her son (2019). She was treated for MDD with Zoloft in college but stopped at graduation due to inconvenience/lack of efficacy. However, in 2020 she began treatment with Lexapro for depression and anxiety with Buspar added to her regimen  in 2021. Her ADHD was treated with methylphenidate in childhood. After ADHD re-diagnosis in 2020 she failed trials of Adderall ER and methylphenidate and has been maintained on Mydayis 37.5 mg daily. Two weeks  prior to admission, she began a trial of guanfacine 1 mg daily for additional inadequately controlled ADHD symptoms such as impulsivity and agitation. She denies previous psychiatric hospitalization.  Patient reports that 1 time in college she drank excessive alcohol and intent to harm herself and overdose.  She denies other suicide attempts in the past  Past Medical History:  Past Medical History:  Diagnosis Date   ADHD (attention deficit hyperactivity disorder)    Depression    Seizures (HCC)     Past Surgical History:  Procedure Laterality Date   APPENDECTOMY  2008/2009   Family History:  Family History  Adopted: Yes   Family Psychiatric  History: None Indicated  Social History:  Social History   Substance and Sexual Activity  Alcohol Use Not Currently     Social History   Substance and Sexual Activity  Drug Use Not Currently   Types: Marijuana    Social History   Socioeconomic History   Marital status: Legally Separated    Spouse name: Not on file   Number of children: 1   Years of education: Not on file   Highest education level: Bachelor's degree (e.g., BA, AB, BS)  Occupational History   Not on file  Tobacco Use   Smoking status: Never   Smokeless tobacco: Never  Vaping Use   Vaping Use: Never used  Substance and Sexual Activity   Alcohol use: Not Currently   Drug use: Not Currently    Types: Marijuana   Sexual activity: Yes  Other Topics Concern   Not on file  Social History Narrative   Not on file   Social Determinants of Health   Financial Resource Strain: Low Risk  (08/15/2018)   Overall Financial Resource Strain (CARDIA)    Difficulty of Paying Living Expenses: Not very hard  Food Insecurity: No Food Insecurity (02/09/2018)   Hunger Vital Sign    Worried About Running Out of Food in the Last Year: Never true    Ran Out of Food in the Last Year: Never true  Transportation Needs: No Transportation Needs (02/09/2018)   PRAPARE - Therapist, art (Medical): No    Lack of Transportation (Non-Medical): No  Physical Activity: Inactive (02/09/2018)   Exercise Vital Sign    Days of Exercise per Week: 0 days    Minutes of Exercise per Session: 0 min  Stress: No Stress Concern Present (02/09/2018)   [employer] of Occupational Health - Occupational Stress Questionnaire    Feeling of Stress : Not at all  Social Connections: Unknown (08/15/2018)   Social Connection and Isolation Panel [NHANES]    Frequency of Communication with Friends and Family: Not on file    Frequency of Social Gatherings with Friends and Family: Not on file    Attends Religious Services: Not on file    Active Member of Clubs or Organizations: Not on file    Attends Banker Meetings: Not on file    Marital Status: Living with partner   Additional Social History:    Sleep: Good  Appetite:  Good  Current Medications: Current Facility-Administered Medications  Medication Dose Route Frequency Provider Last Rate Last Admin   acetaminophen (TYLENOL) tablet 650 mg  650 mg Oral Q6H PRN Theodore Virgin, Jesusita Oka, FNP  alum & mag hydroxide-simeth (MAALOX/MYLANTA) 200-200-20 MG/5ML suspension 30 mL  30 mL Oral Q4H PRN Mitchael Luckey, Jesusita Oka, FNP       amphetamine-dextroamphetamine (ADDERALL XR) 24 hr capsule 20 mg  20 mg Oral Daily Massengill, Nathan, MD   20 mg at 12/19/21 0839   busPIRone (BUSPAR) tablet 10 mg  10 mg Oral BID Massengill, Harrold Donath, MD   10 mg at 12/19/21 1708   escitalopram (LEXAPRO) tablet 20 mg  20 mg Oral Daily Massengill, Harrold Donath, MD   20 mg at 12/19/21 0836   guanFACINE (TENEX) tablet 1 mg  1 mg Oral QHS Massengill, Nathan, MD   1 mg at 12/18/21 2059   hydrOXYzine (ATARAX) tablet 25 mg  25 mg Oral TID PRN Cecilie Lowers, FNP   25 mg at 12/18/21 2059   levETIRAcetam (KEPPRA) tablet 500 mg  500 mg Oral BID Massengill, Harrold Donath, MD   500 mg at 12/19/21 1707   magnesium hydroxide (MILK OF MAGNESIA) suspension 30 mL  30 mL Oral Daily  PRN Ifeoluwa Beller, Jesusita Oka, FNP       traZODone (DESYREL) tablet 50 mg  50 mg Oral QHS PRN Henrry Feil, Jesusita Oka, FNP        Lab Results: No results found for this or any previous visit (from the past 48 hour(s)).  Blood Alcohol level:  Lab Results  Component Value Date   ETH <10 12/14/2021    Metabolic Disorder Labs: Lab Results  Component Value Date   HGBA1C 4.8 12/16/2021   MPG 91.06 12/16/2021   No results found for: "PROLACTIN" Lab Results  Component Value Date   CHOL 198 12/16/2021   TRIG 61 12/16/2021   HDL 63 12/16/2021   CHOLHDL 3.1 12/16/2021   VLDL 12 12/16/2021   LDLCALC 123 (H) 12/16/2021    Physical Findings: AIMS:  , ,  ,  ,    CIWA:    COWS:     Musculoskeletal: Strength & Muscle Tone: within normal limits Gait & Station: normal Patient leans: N/A  Psychiatric Specialty Exam:  Presentation  General Appearance: Appropriate for Environment; Casual; Fairly Groomed  Eye Contact:Good  Speech:Clear and Coherent; Normal Rate  Speech Volume:Normal  Handedness:Right  Mood and Affect  Mood:Euthymic  Affect:Appropriate; Congruent  Thought Process  Thought Processes:Coherent  Descriptions of Associations:Intact  Orientation:Full (Time, Place and Person)  Thought Content:Logical; WDL  History of Schizophrenia/Schizoaffective disorder:No  Duration of Psychotic Symptoms:No data recorded Hallucinations:Hallucinations: None  Ideas of Reference:None  Suicidal Thoughts:Suicidal Thoughts: No SI Active Intent and/or Plan: -- (denies)  Homicidal Thoughts:Homicidal Thoughts: No  Sensorium  Memory:Immediate Good; Recent Good; Remote Good  Judgment:Fair  Insight:Fair  Executive Functions  Concentration:Fair  Attention Span:Fair  Recall:Fair  Fund of Knowledge:Fair  Language:Good  Psychomotor Activity  Psychomotor Activity:Psychomotor Activity: Normal  Assets  Assets:Communication Skills; Desire for Improvement; Physical Health  Sleep   Sleep:Sleep: Good Number of Hours of Sleep: 7  Physical Exam: Physical Exam Vitals and nursing note reviewed.  Constitutional:      Appearance: Normal appearance.  HENT:     Head: Normocephalic and atraumatic.     Right Ear: External ear normal.     Left Ear: External ear normal.     Nose: Nose normal.     Mouth/Throat:     Mouth: Mucous membranes are moist.  Eyes:     Extraocular Movements: Extraocular movements intact.     Conjunctiva/sclera: Conjunctivae normal.     Pupils: Pupils are equal, round, and reactive to light.  Cardiovascular:     Rate and Rhythm: Tachycardia present.     Comments: P 113 Pulmonary:     Effort: Pulmonary effort is normal.  Abdominal:     Palpations: Abdomen is soft.  Genitourinary:    Comments: deferred Musculoskeletal:     Cervical back: Normal range of motion and neck supple.  Skin:    General: Skin is warm.  Neurological:     General: No focal deficit present.     Mental Status: She is alert and oriented to person, place, and time.  Psychiatric:        Mood and Affect: Mood normal.        Behavior: Behavior normal.    Review of Systems  Constitutional: Negative.  Negative for chills and fever.  HENT: Negative.  Negative for hearing loss and tinnitus.   Eyes: Negative.  Negative for blurred vision and double vision.  Respiratory: Negative.  Negative for sputum production, shortness of breath and wheezing.   Cardiovascular: Negative.  Negative for chest pain and palpitations.  Gastrointestinal: Negative.  Negative for heartburn and nausea.  Genitourinary: Negative.  Negative for dysuria and urgency.  Musculoskeletal: Negative.  Negative for myalgias and neck pain.  Skin: Negative.  Negative for rash.  Neurological: Negative.  Negative for dizziness, tingling and headaches.  Endo/Heme/Allergies: Negative.  Negative for environmental allergies and polydipsia. Does not bruise/bleed easily.  Psychiatric/Behavioral:  Positive for  depression, substance abuse and suicidal ideas. The patient is nervous/anxious and has insomnia.    Blood pressure 101/62, pulse (!) 113, temperature 98.4 F (36.9 C), temperature source Oral, resp. rate 16, height 5\' 3"  (1.6 m), weight 78 kg, SpO2 100 %. Body mass index is 30.47 kg/m.   Treatment Plan Summary: Daily contact with patient to assess and evaluate symptoms and progress in treatment and Medication management  Treatment Plan Summary: Daily contact with patient to assess and evaluate symptoms and progress in treatment   ASSESSMENT:   Diagnoses / Active Problems: -MDD severe recurrent without psychosis -GAD with panic attacks -ADHD Rule out cannabis use disorder Rule out alcohol use disorder History of epilepsy +/-pseudoseizures ?  H[place] disease risk on genetic testing   PLAN: Safety and Monitoring:             -- Involuntary admission to inpatient psychiatric unit for safety, stabilization and treatment             -- Daily contact with patient to assess and evaluate symptoms and progress in treatment             -- Patient's case to be discussed in multi-disciplinary team meeting             -- Observation Level : q15 minute checks             -- Vital signs:  q12 hours             -- Precautions: suicide, elopement, and assault   2. Psychiatric Diagnoses and Treatment:               -Continue Lexapro 20 mg once a day for MDD and GAD -Continue BuSpar 10 mg twice daily -Continue guanfacine 1 mg nightly for ADHD and impulse control -Start Keppra 500 mg twice daily for seizure disorder -Re-start Mydayis 25 mg qam for ADHD tomorrow in AM - Unfortunately, this medication (or any approximate substitute) is unavailable within the formulary at this treatment setting. However, patient anticipates that her friend will be able to  get the prescription to her later tonight. Untreated ADHD is the most significant contributor to patient's current presentation; therefore, it is  important to continue to monitor for clinical improvement after re-initiating Mydayis 25 mg before patient could be appropriate for discharge.   -F/u EKG (already ordered)    --  The risks/benefits/side-effects/alternatives to this medication were discussed in detail with the patient and time was given for questions. The patient consents to medication trial.              -- Metabolic profile and EKG monitoring obtained while on an atypical antipsychotic (BMI: Lipid Panel: HbgA1c: QTc:)              -- Encouraged patient to participate in unit milieu and in scheduled group therapies    3. Medical Issues Being Addressed:                Tobacco Use Disorder             -- Nicotine patch gum prn               Historical dx of Unspecified Seizure Disorder -start keppra for h/o seizure d/o and recent seizure like activity. Pt will need to f/u with neurologist  -will recommend not driving until neurologist eval, at dc   4. Discharge Planning:              -- Social work and case management to assist with discharge planning and identification of hospital follow-up needs prior to discharge             -- Estimated LOS: 5-7 days             -- Discharge Concerns: Need to establish a safety plan; Medication compliance and effectiveness             -- Discharge Goals: Return home with outpatient referrals for mental health follow-up including medication management/psychotherapy  Cecilie Lowers, FNP 12/19/2021, 6:34 PM

## 2021-12-19 NOTE — Group Note (Signed)
LCSW Group Therapy Note  12/19/2021     Type of Therapy and Topic:  Group Therapy: Anger and Coping Skills  Participation Level:  Active   Description of Group:   In this group, patients learned how to recognize the physical, cognitive, emotional, and behavioral responses they have to anger-provoking situations.  They identified how they usually or often react when angered, and learned how healthy and unhealthy coping skills work initially, but the unhealthy ones stop working.   They analyzed how their frequently-chosen coping skill is possibly beneficial and how it is possibly unhelpful.  The group discussed a variety of healthier coping skills that could help in resolving the actual issues, as well as how to go about planning for the the possibility of future similar situations.  Therapeutic Goals: Patients will identify one thing that makes them angry and how they feel emotionally and physically, what their thoughts are or tend to be in those situations, and what healthy or unhealthy coping mechanism they typically use Patients will identify how their coping technique works for them, as well as how it works against them. Patients will explore possible new behaviors to use in future anger situations. Patients will learn that anger itself is normal and cannot be eliminated, and that healthier coping skills can assist with resolving conflict rather than worsening situations.  Summary of Patient Progress:  The patient shared that she often is angered by a lot of different things such as noises (dogs barking, water dripping) and chooses to cope with these feelings by coloring, doing puzzles, singing, letting her music blare, or cussing at people.  Another thing that angers her is being told "you need to" do something as though she is a child and "stupid people."  The patient sees these coping skills as healthy or unhealthy appropriately (depending on which it actually is) and was receptive to the idea  of learning more about our anger and working on it after the fact or before the next time.  Therapeutic Modalities:   Cognitive Behavioral Therapy   Lynnell Chad  .

## 2021-12-20 NOTE — Group Note (Signed)
[facility] LCSW Group Therapy Note  Date/Time:  12/20/2021 10:00am-11:00am  Type of Therapy and Topic:  Group Therapy:  Healthy and Unhealthy Supports  Participation Level:  Active   Description of Group:  Patients in this group were introduced to the idea of adding a variety of healthy supports to address the various needs in their lives.  Patients discussed what additional healthy supports could be helpful in their recovery and wellness after discharge in order to prevent future hospitalizations such as counselor, doctor, other levels of psychiatric care such as ACTT services, therapy groups, 12-step groups, and problem-specific support groups.  A demonstration was given about how to set boundaries which patients expressed was beneficial.  Several songs were played to inspire patients to be more self-supportive.  Therapeutic Goals:   1)  discuss importance of adding supports to stay well once out of the hospital  2)  compare healthy versus unhealthy supports and identify some examples of each  3)  generate ideas and descriptions of healthy supports that can be added  4)  offer mutual support about how to address unhealthy supports  5)  encourage active participation in and adherence to discharge plan    Summary of Patient Progress:  The patient stated that current healthy supports in her life are her sister, best friend, boyfriend, father, and stepmother.  The patient expressed that her mother tries to be a healthy support but also plays "the victim" a lot so can be unhealthy.  Her ex-husband also varies from being healthy to being unhealthy for her.  She herself is an unhealthy support for herself..   Therapeutic Modalities:   Motivational Interviewing Brief Solution-Focused Therapy  Ambrose Mantle, LCSW

## 2021-12-20 NOTE — Progress Notes (Signed)
   12/20/21 2144  Psych Admission Type (Psych Patients Only)  Admission Status Involuntary  Psychosocial Assessment  Patient Complaints Anxiety  Eye Contact Brief  Facial Expression Anxious  Affect Appropriate to circumstance  Speech Logical/coherent  Interaction Assertive  Motor Activity Other (Comment) (WDL)  Appearance/Hygiene Unremarkable  Behavior Characteristics Cooperative  Mood Anxious  Thought Process  Coherency WDL  Content WDL  Delusions None reported or observed  Perception WDL  Hallucination None reported or observed  Judgment WDL  Confusion None  Danger to Self  Current suicidal ideation? Denies  Self-Injurious Behavior No self-injurious ideation or behavior indicators observed or expressed   Agreement Not to Harm Self Yes  Description of Agreement verbal  Danger to Others  Danger to Others None reported or observed   D: Patient in dayroom reports she had a good day and is able to engage therapeutically. Later that evening, Pt appears upset after a phone call. Pt decline to elaborate. A: Medications administered as prescribed. Support and encouragement provided as needed.  R: Patient remains safe on the unit. Will continue to monitor for safety and stability.

## 2021-12-20 NOTE — [facility] Suicide Risk Assessment (Signed)
[facility] INPATIENT:  Family/Significant Other Suicide Prevention Education  Suicide Prevention Education:  Education Completed; Melinda Mills [phone] pt's mother, has been identified by the patient as the family member/significant other with whom the patient will be residing, and identified as the person(s) who will aid the patient in the event of a mental health crisis (suicidal ideations/suicide attempt).  With written consent from the patient, the family member/significant other has been provided the following suicide prevention education, prior to the and/or following the discharge of the patient.  The suicide prevention education provided includes the following: Suicide risk factors Suicide prevention and interventions National Suicide Hotline telephone number [facility] assessment telephone number [facility] Emergency Assistance 911 [facility] and/or Residential Mobile Crisis Unit telephone number  Request made of family/significant other to: Remove weapons (e.g., guns, rifles, knives), all items previously/currently identified as safety concern.   Remove drugs/medications (over-the-counter, prescriptions, illicit drugs), all items previously/currently identified as a safety concern.  The family member/significant other verbalizes understanding of the suicide prevention education information provided.  The family member/significant other agrees to remove the items of safety concern listed above. Pt's mother verified that [facility] does not have access to guns and stated that a family member will plan to stay with her for 30 days after discharge to monitor medication usage and pt's progress. Pt's father to pick up pt at 11:30am Monday morning 12/21/21.  Melinda Mills LCSWA 12/20/2021, 10:10 AM

## 2021-12-20 NOTE — Progress Notes (Signed)
[facility] MD Progress Note  12/20/2021 2:34 PM Melinda Mills  MRN:  [id number]  Subjective: Melinda Mills states, "I missed my son who will be 29 years old in September 2023. I am getting well so as to be discharged home to be with him."  Brief History: Melinda Mills is a 31 year-old woman with a reported past psychiatric history of MDD, anxiety, and ADHD, who was brought to the ED by a concerned friend after the friend was informed that the patient had prepared suicide notes for her family to find the next day. She was placed under involuntary commitment status due to suicidal ideation with intention and plan to overdose on pills.   Yesterday, the psychiatry team made following recommendations: -Continue Lexapro 20 mg once a day for MDD and GAD -Increase BuSpar from 10 mg once daily to 10 mg twice daily -Continue guanfacine 1 mg nightly for ADHD and impulse control ?-Start Keppra 500 mg twice daily for seizure disorder -Restart Mydayis 25 mg qam for ADHD (Not restarted at this time due to this medication (or any approximate substitute) is unavailable within the formulary at this treatment setting. Patient reports that her father has the Mydayis, however, she prefers the Adderall for now, until she revisits with her PCP after discharge.  On assessment today, patient is examined in her room.  Appears calm, cooperating with the exam and displaying her [place] puzzle she's working on.  Chart reviewed and findings shared with the treatment team and consulted with Dr. Abbott Pao.  Alert and oriented x 4 and able to respond appropriately to all the assessment questions.  Speech fluent with normal volume and pattern. Able to maintain good eye contact with the provider during assessment.  Mood is euthymic, and affect appropriate and congruent.  Thought process coherent and thought content logical.  Memory, judgment, and insight fair.  Actively participating in therapeutic milieu and group activities in the unit.  No routine  labs noted at this time however, UDS on 12/14/2021 positive for marijuana. No seizure-like activity reported today.  Patient requested to know if she could be discharged today or tomorrow. Made patient aware that she was not scheduled for discharge today, that she would be made aware of possible discharge by early next week. Patient denies suicidal ideations, homicidal ideation, paranoia, auditory/visual hallucinations. Endorses sleeping for 7 hours last night and feeling restful. Endorses good appetite and drinking adequate fluid for hydration. Endorses less anxiety and rates as 1/10 on a scale of 0-10 10 being the worst. Reports less depression and rates as 0/10 on a scale of 0-10, with 10 being the worst. Reports taking medication as prescribed, without any adverse reactions.   Patient reports that her step mother, picked up her apartment keys and insurance card yesterday during visiting hours. Added that her father has the Mydayis ADHD medication and she will not take it during this admission until she meets with her PCP.  Principal Problem: MDD (major depressive disorder), recurrent, severe, with psychosis (HCC)  Diagnosis: Principal Problem:   MDD (major depressive disorder), recurrent, severe, with psychosis (HCC) Active Problems:   History of seizure   GAD (generalized anxiety disorder)   ADHD (attention deficit hyperactivity disorder), combined type   Cannabis use, uncomplicated  Total Time spent with patient: 30 minutes  Past Psychiatric History: Patient describes a historical diagnosis of ADHD from childhood which was re-evaluated and confirmed in 2020. Additionally, she was diagnosed with MDD during her first year of undergraduate (2012). She also describes significant  anxiety and panic attacks which began shortly after the birth of her son (2019). She was treated for MDD with Zoloft in college but stopped at graduation due to inconvenience/lack of efficacy. However, in 2020 she  began treatment with Lexapro for depression and anxiety with Buspar added to her regimen in 2021. Her ADHD was treated with methylphenidate in childhood. After ADHD re-diagnosis in 2020 she failed trials of Adderall ER and methylphenidate and has been maintained on Mydayis 37.5 mg daily. Two weeks prior to admission, she began a trial of guanfacine 1 mg daily for additional inadequately controlled ADHD symptoms such as impulsivity and agitation. She denies previous psychiatric hospitalization.  Patient reports that 1 time in college she drank excessive alcohol and intent to harm herself and overdose.  She denies other suicide attempts in the past  Past Medical History:  Past Medical History:  Diagnosis Date   ADHD (attention deficit hyperactivity disorder)    Depression    Seizures (HCC)     Past Surgical History:  Procedure Laterality Date   APPENDECTOMY  2008/2009   Family History:  Family History  Adopted: Yes   Family Psychiatric  History: None Indicated  Social History:  Social History   Substance and Sexual Activity  Alcohol Use Not Currently     Social History   Substance and Sexual Activity  Drug Use Not Currently   Types: Marijuana    Social History   Socioeconomic History   Marital status: Legally Separated    Spouse name: Not on file   Number of children: 1   Years of education: Not on file   Highest education level: Bachelor's degree (e.g., BA, AB, BS)  Occupational History   Not on file  Tobacco Use   Smoking status: Never   Smokeless tobacco: Never  Vaping Use   Vaping Use: Never used  Substance and Sexual Activity   Alcohol use: Not Currently   Drug use: Not Currently    Types: Marijuana   Sexual activity: Yes  Other Topics Concern   Not on file  Social History Narrative   Not on file   Social Determinants of Health   Financial Resource Strain: Low Risk  (08/15/2018)   Overall Financial Resource Strain (CARDIA)    Difficulty of Paying Living  Expenses: Not very hard  Food Insecurity: No Food Insecurity (02/09/2018)   Hunger Vital Sign    Worried About Running Out of Food in the Last Year: Never true    Ran Out of Food in the Last Year: Never true  Transportation Needs: No Transportation Needs (02/09/2018)   PRAPARE - Administrator, Civil Service (Medical): No    Lack of Transportation (Non-Medical): No  Physical Activity: Inactive (02/09/2018)   Exercise Vital Sign    Days of Exercise per Week: 0 days    Minutes of Exercise per Session: 0 min  Stress: No Stress Concern Present (02/09/2018)   [employer] of Occupational Health - Occupational Stress Questionnaire    Feeling of Stress : Not at all  Social Connections: Unknown (08/15/2018)   Social Connection and Isolation Panel [NHANES]    Frequency of Communication with Friends and Family: Not on file    Frequency of Social Gatherings with Friends and Family: Not on file    Attends Religious Services: Not on file    Active Member of Clubs or Organizations: Not on file    Attends Banker Meetings: Not on file    Marital Status:  Living with partner   Additional Social History:    Sleep: Good  Appetite:  Good  Current Medications: Current Facility-Administered Medications  Medication Dose Route Frequency Provider Last Rate Last Admin   acetaminophen (TYLENOL) tablet 650 mg  650 mg Oral Q6H PRN Martine Bleecker, Jesusita Oka, FNP       alum & mag hydroxide-simeth (MAALOX/MYLANTA) 200-200-20 MG/5ML suspension 30 mL  30 mL Oral Q4H PRN Ladajah Soltys, Jesusita Oka, FNP       amphetamine-dextroamphetamine (ADDERALL XR) 24 hr capsule 20 mg  20 mg Oral Daily Massengill, Nathan, MD   20 mg at 12/20/21 0733   busPIRone (BUSPAR) tablet 10 mg  10 mg Oral BID Massengill, Harrold Donath, MD   10 mg at 12/20/21 0734   escitalopram (LEXAPRO) tablet 20 mg  20 mg Oral Daily Massengill, Harrold Donath, MD   20 mg at 12/20/21 0733   guanFACINE (TENEX) tablet 1 mg  1 mg Oral QHS Massengill, Nathan, MD   1 mg at  12/19/21 2200   hydrOXYzine (ATARAX) tablet 25 mg  25 mg Oral TID PRN Cecilie Lowers, FNP   25 mg at 12/19/21 2006   levETIRAcetam (KEPPRA) tablet 500 mg  500 mg Oral BID Massengill, Harrold Donath, MD   500 mg at 12/20/21 6213   magnesium hydroxide (MILK OF MAGNESIA) suspension 30 mL  30 mL Oral Daily PRN Trajon Rosete, Jesusita Oka, FNP       traZODone (DESYREL) tablet 50 mg  50 mg Oral QHS PRN Arie Gable, Jesusita Oka, FNP        Lab Results: No results found for this or any previous visit (from the past 48 hour(s)).  Blood Alcohol level:  Lab Results  Component Value Date   ETH <10 12/14/2021    Metabolic Disorder Labs: Lab Results  Component Value Date   HGBA1C 4.8 12/16/2021   MPG 91.06 12/16/2021   No results found for: "PROLACTIN" Lab Results  Component Value Date   CHOL 198 12/16/2021   TRIG 61 12/16/2021   HDL 63 12/16/2021   CHOLHDL 3.1 12/16/2021   VLDL 12 12/16/2021   LDLCALC 123 (H) 12/16/2021    Physical Findings: AIMS: Facial and Oral Movements Muscles of Facial Expression: None, normal Lips and Perioral Area: None, normal Jaw: None, normal Tongue: None, normal,Extremity Movements Upper (arms, wrists, hands, fingers): None, normal Lower (legs, knees, ankles, toes): None, normal, Trunk Movements Neck, shoulders, hips: None, normal, Overall Severity Severity of abnormal movements (highest score from questions above): None, normal Incapacitation due to abnormal movements: None, normal Patient's awareness of abnormal movements (rate only patient's report): No Awareness, Dental Status Current problems with teeth and/or dentures?: No Does patient usually wear dentures?: No  CIWA:    COWS:     Musculoskeletal: Strength & Muscle Tone: within normal limits Gait & Station: normal Patient leans: N/A  Psychiatric Specialty Exam:  Presentation  General Appearance: Appropriate for Environment; Casual; Fairly Groomed  Eye Contact:Good  Speech:Clear and Coherent; Normal Rate  Speech  Volume:Normal  Handedness:Right  Mood and Affect  Mood:Euthymic  Affect:Appropriate; Congruent  Thought Process  Thought Processes:Coherent; Linear; Goal Directed  Descriptions of Associations:Intact  Orientation:Full (Time, Place and Person)  Thought Content:Logical; WDL  History of Schizophrenia/Schizoaffective disorder:No  Duration of Psychotic Symptoms:No data recorded Hallucinations:Hallucinations: None  Ideas of Reference:None  Suicidal Thoughts:Suicidal Thoughts: No SI Active Intent and/or Plan: -- (denies)  Homicidal Thoughts:Homicidal Thoughts: No  Sensorium  Memory:Immediate Good; Recent Good; Remote Good  Judgment:Fair  Insight:Fair  Executive Functions  Concentration:Good  Attention Span:Good  Recall:Fair  Fund of Knowledge:Good  Language:Good  Psychomotor Activity  Psychomotor Activity:Psychomotor Activity: Normal  Assets  Assets:Communication Skills; Desire for Improvement; Social Support; Physical Health; Vocational/Educational  Sleep  Sleep:Sleep: Good Number of Hours of Sleep: 6  Physical Exam: Physical Exam Vitals and nursing note reviewed.  Constitutional:      Appearance: Normal appearance.  HENT:     Head: Normocephalic and atraumatic.     Right Ear: External ear normal.     Left Ear: External ear normal.     Nose: Nose normal.     Mouth/Throat:     Mouth: Mucous membranes are moist.  Eyes:     Extraocular Movements: Extraocular movements intact.     Conjunctiva/sclera: Conjunctivae normal.     Pupils: Pupils are equal, round, and reactive to light.  Cardiovascular:     Rate and Rhythm: Tachycardia present.     Comments: BP 107/58, Nursing staff to recheck VS.  Pulmonary:     Effort: Pulmonary effort is normal.  Abdominal:     Palpations: Abdomen is soft.  Genitourinary:    Comments: deferred Musculoskeletal:     Cervical back: Normal range of motion and neck supple.  Skin:    General: Skin is warm.   Neurological:     General: No focal deficit present.     Mental Status: She is alert and oriented to person, place, and time.  Psychiatric:        Mood and Affect: Mood normal.        Behavior: Behavior normal.    Review of Systems  Constitutional: Negative.  Negative for chills and fever.  HENT: Negative.  Negative for hearing loss and tinnitus.   Eyes: Negative.  Negative for blurred vision and double vision.  Respiratory: Negative.  Negative for sputum production, shortness of breath and wheezing.   Cardiovascular: Negative.  Negative for chest pain and palpitations.       BP 107/58, Nursing staff to recheck VS.  Gastrointestinal: Negative.  Negative for heartburn and nausea.  Genitourinary: Negative.  Negative for dysuria and urgency.  Musculoskeletal: Negative.  Negative for myalgias and neck pain.  Skin: Negative.  Negative for rash.  Neurological:  Positive for seizures (Hx of Seizure-like activity). Negative for dizziness, tingling and headaches.  Endo/Heme/Allergies: Negative.  Negative for environmental allergies and polydipsia. Does not bruise/bleed easily.  Psychiatric/Behavioral:  Positive for depression, substance abuse and suicidal ideas. The patient is nervous/anxious and has insomnia.    Blood pressure (!) 107/58, pulse 79, temperature 98.4 F (36.9 C), temperature source Oral, resp. rate 16, height  (1.6 m), weight 78 kg, SpO2 100 %. Body mass index is 30.47 kg/m.   Treatment Plan Summary: Daily contact with patient to assess and evaluate symptoms and progress in treatment and Medication management  Treatment Plan Summary: Daily contact with patient to assess and evaluate symptoms and progress in treatment   ASSESSMENT:   Diagnoses / Active Problems: -MDD severe recurrent without psychosis -GAD with panic attacks -ADHD Rule out cannabis use disorder Rule out alcohol use disorder History of epilepsy +/-pseudoseizures ?  Huntington disease risk on  genetic testing   PLAN: Safety and Monitoring:             -- Involuntary admission to inpatient psychiatric unit for safety, stabilization and treatment             -- Daily contact with patient to assess and evaluate symptoms and progress in treatment             --  Patient's case to be discussed in multi-disciplinary team meeting             -- Observation Level : q15 minute checks             -- Vital signs:  q12 hours             -- Precautions: suicide, elopement, and assault   2. Psychiatric Diagnoses and Treatment:               -Continue Lexapro 20 mg once a day for MDD and GAD -Continue BuSpar 10 mg twice daily -Continue guanfacine 1 mg nightly for ADHD and impulse control -Start Keppra 500 mg twice daily for seizure disorder -Re-start Mydayis 25 mg qam for ADHD tomorrow in AM - Unfortunately, this medication (or any approximate substitute) is unavailable within the formulary at this treatment setting. However, patient anticipates that her friend will be able to get the prescription to her later tonight. Untreated ADHD is the most significant contributor to patient's current presentation; therefore, it is important to continue to monitor for clinical improvement after re-initiating Mydayis 25 mg before patient could be appropriate for discharge. See information on Mydayis above.   -F/u EKG (already ordered)    --  The risks/benefits/side-effects/alternatives to this medication were discussed in detail with the patient and time was given for questions. The patient consents to medication trial.              -- Metabolic profile and EKG monitoring obtained while on an atypical antipsychotic (BMI: Lipid Panel: HbgA1c: QTc:)              -- Encouraged patient to participate in unit milieu and in scheduled group therapies    3. Medical Issues Being Addressed:                Tobacco Use Disorder             -- Nicotine patch gum prn               Historical dx of Unspecified Seizure  Disorder -start keppra for h/o seizure d/o and recent seizure like activity. Pt will need to f/u with neurologist  -will recommend not driving until neurologist eval, at dc   4. Discharge Planning:              -- Social work and case management to assist with discharge planning and identification of hospital follow-up needs prior to discharge             -- Estimated LOS: 5-7 days             -- Discharge Concerns: Need to establish a safety plan; Medication compliance and effectiveness             -- Discharge Goals: Return home with outpatient referrals for mental health follow-up including medication management/psychotherapy  Cecilie Lowers, FNP 12/20/2021, 2:34 PMPatient ID: Johnney Ou, female   DOB: 1991-04-09, 31 y.o.   MRN: [id number]

## 2021-12-20 NOTE — Progress Notes (Signed)
Pt denies SI/HI/AVH and verbally agrees to approach staff if these become apparent or before harming themselves/others. Rates depression 0/10. Rates anxiety 0/10. Rates pain 0/10.  Scheduled medications administered to pt, per MD orders. RN provided support and encouragement to pt. Q15 min safety checks implemented and continued. Pt safe on the unit. RN will continue to monitor and intervene as needed.   12/20/21 0733  Psych Admission Type (Psych Patients Only)  Admission Status Involuntary  Psychosocial Assessment  Patient Complaints None  Eye Contact Brief  Facial Expression Other (Comment) (WDL)  Affect Appropriate to circumstance  Speech Logical/coherent  Interaction Assertive  Motor Activity Other (Comment) (WDL)  Appearance/Hygiene Unremarkable  Behavior Characteristics Cooperative;Appropriate to situation;Calm  Mood Pleasant;Euthymic  Thought Process  Coherency WDL  Content WDL  Delusions None reported or observed  Perception WDL  Hallucination None reported or observed  Judgment WDL  Confusion None  Danger to Self  Current suicidal ideation? Denies  Danger to Others  Danger to Others None reported or observed

## 2021-12-20 NOTE — Progress Notes (Signed)
Adult Psychoeducational Group Note  Date:  12/20/2021 Time:  9:04 PM  Group Topic/Focus:  Wrap-Up Group:   The focus of this group is to help patients review their daily goal of treatment and discuss progress on daily workbooks.  Participation Level:  Active  Participation Quality:  Appropriate  Affect:  Appropriate  Cognitive:  Appropriate  Insight: Appropriate  Engagement in Group:  Engaged  Modes of Intervention:  Discussion  Additional Comments:   Pt attended the Wrap UP group. Pt rated her mood/day 3/10. Pt stated "I'm pissed". Pt used coloring, puzzles, comedy and positive socialization to improve mood and coping.  Melinda Mills 12/20/2021, 9:04 PM

## 2021-12-20 NOTE — Group Note (Signed)
Date:  12/20/2021 Time:  11:45 AM  Group Topic/Focus:  Goals Group:   The focus of this group is to help patients establish daily goals to achieve during treatment and discuss how the patient can incorporate goal setting into their daily lives to aide in recovery.   Participation Level:  Did Not Attend  Margaret Pyle 12/20/2021, 11:45 AM

## 2021-12-20 NOTE — [facility] Group Notes (Signed)
[facility] Group Notes:  (Nursing/MHT)  Date:  12/20/2021  Time: 1300  Type of Therapy:  Psychoeducational Skills  Participation Level:  Active  Participation Quality:  Appropriate  Affect:  Appropriate  Cognitive:  Alert and Appropriate  Insight:  Appropriate  Engagement in Group:  Engaged  Modes of Intervention:  Discussion, Exploration, Rapport Building, Socialization, and Support  Shela Nevin 12/20/2021, 6:02 PM

## 2021-12-21 ENCOUNTER — Telehealth: Payer: Self-pay

## 2021-12-21 ENCOUNTER — Encounter (HOSPITAL_COMMUNITY): Payer: Self-pay

## 2021-12-21 ENCOUNTER — Encounter: Payer: Self-pay | Admitting: Psychiatry

## 2021-12-21 MED ORDER — ARIPIPRAZOLE 5 MG PO TABS
5.0000 mg | ORAL_TABLET | Freq: Every day | ORAL | Status: DC
  Administered 2021-12-21 – 2021-12-22 (×2): 5 mg via ORAL
  Filled 2021-12-21 (×4): qty 1

## 2021-12-21 NOTE — [facility] Group Notes (Signed)
  Spiritual care group on grief and loss facilitated by chaplain Dyanne Carrel, [facility]   Group Goal:   Support / Education around grief and loss   Members engage in facilitated group support and psycho-social education.   Group Description:   Following introductions and group rules, group members engaged in facilitated group dialog and support around topic of loss, with particular support around experiences of loss in their lives. Group Identified types of loss (relationships / self / things) and identified patterns, circumstances, and changes that precipitate losses. Reflected on thoughts / feelings around loss, normalized grief responses, and recognized variety in grief experience. Group noted Worden's four tasks of grief in discussion.   Group drew on Adlerian / Rogerian, narrative, MI,   Patient Progress: Melinda Mills attended group and actively engaged in group conversation.  At some point she put her head down and when she raised it up she stated that her medication was making her sleepy and she left to go lay down.    [address], Bcc Pager, [phone]

## 2021-12-21 NOTE — Progress Notes (Signed)
   12/21/21 2142  Psych Admission Type (Psych Patients Only)  Admission Status Involuntary  Psychosocial Assessment  Patient Complaints Anxiety  Eye Contact Fair  Facial Expression Anxious  Affect Appropriate to circumstance  Speech Logical/coherent  Interaction Assertive  Motor Activity Other (Comment) (WDL)  Appearance/Hygiene Unremarkable  Behavior Characteristics Cooperative  Mood Anxious  Thought Process  Coherency WDL  Content WDL  Delusions None reported or observed  Perception WDL  Hallucination None reported or observed  Judgment WDL  Confusion None  Danger to Self  Current suicidal ideation? Denies  Self-Injurious Behavior No self-injurious ideation or behavior indicators observed or expressed   Agreement Not to Harm Self Yes  Description of Agreement verbal contract  Danger to Others  Danger to Others None reported or observed   D: Patient in dayroom reports this morning was hard but it got better as the day progressed.  A: Medications administered as prescribed. Support and encouragement provided as needed.  R: Patient remains safe on the unit. Will continue to monitor for safety and stability.

## 2021-12-21 NOTE — Progress Notes (Signed)
   12/21/21 0900  Psych Admission Type (Psych Patients Only)  Admission Status Involuntary  Psychosocial Assessment  Patient Complaints Anxiety  Eye Contact Brief  Facial Expression Anxious  Affect Appropriate to circumstance  Speech Logical/coherent  Interaction Assertive  Motor Activity Other (Comment) (WDL)  Appearance/Hygiene Unremarkable  Behavior Characteristics Anxious;Cooperative  Mood Anxious  Thought Process  Coherency WDL  Content WDL  Delusions None reported or observed  Perception WDL  Hallucination None reported or observed  Judgment WDL  Confusion None  Danger to Self  Current suicidal ideation? Denies  Self-Injurious Behavior No self-injurious ideation or behavior indicators observed or expressed   Agreement Not to Harm Self Yes  Description of Agreement Verbal  Danger to Others  Danger to Others None reported or observed

## 2021-12-21 NOTE — Telephone Encounter (Signed)
This encounter was created in error - please disregard.

## 2021-12-21 NOTE — BH IP Treatment Plan (Signed)
Interdisciplinary Treatment and Diagnostic Plan Update  12/21/2021 Time of Session: 0830 Melinda Mills MRN: [id number]  Principal Diagnosis: MDD (major depressive disorder), recurrent, severe, with psychosis ([place])  Secondary Diagnoses: Principal Problem:   MDD (major depressive disorder), recurrent, severe, with psychosis ([place]) Active Problems:   History of seizure   GAD (generalized anxiety disorder)   ADHD (attention deficit hyperactivity disorder), combined type   Cannabis use, uncomplicated   Current Medications:  Current Facility-Administered Medications  Medication Dose Route Frequency Provider Last Rate Last Admin   acetaminophen (TYLENOL) tablet 650 mg  650 mg Oral Q6H PRN Ntuen, Kris Hartmann, FNP       alum & mag hydroxide-simeth (MAALOX/MYLANTA) 200-200-20 MG/5ML suspension 30 mL  30 mL Oral Q4H PRN Ntuen, Kris Hartmann, FNP       amphetamine-dextroamphetamine (ADDERALL XR) 24 hr capsule 20 mg  20 mg Oral Daily Massengill, Nathan, MD   20 mg at 12/21/21 [id number]   ARIPiprazole (ABILIFY) tablet 5 mg  5 mg Oral Daily Massengill, Nathan, MD   5 mg at 12/21/21 1021   busPIRone (BUSPAR) tablet 10 mg  10 mg Oral BID Massengill, Ovid Curd, MD   10 mg at 12/21/21 0828   escitalopram (LEXAPRO) tablet 20 mg  20 mg Oral Daily Massengill, Ovid Curd, MD   20 mg at 12/21/21 [id number]   guanFACINE (TENEX) tablet 1 mg  1 mg Oral QHS Massengill, Nathan, MD   1 mg at 12/20/21 2130   hydrOXYzine (ATARAX) tablet 25 mg  25 mg Oral TID PRN Laretta Bolster, FNP   25 mg at 12/21/21 0829   levETIRAcetam (KEPPRA) tablet 500 mg  500 mg Oral BID Massengill, Ovid Curd, MD   500 mg at 12/21/21 [id number]   magnesium hydroxide (MILK OF MAGNESIA) suspension 30 mL  30 mL Oral Daily PRN Ntuen, Kris Hartmann, FNP       traZODone (DESYREL) tablet 50 mg  50 mg Oral QHS PRN Ntuen, Kris Hartmann, FNP   50 mg at 12/20/21 2250   PTA Medications: Medications Prior to Admission  Medication Sig Dispense Refill Last Dose   BALZIVA 0.4-35 MG-MCG tablet Take 1 tablet by  mouth daily. (Patient not taking: Reported on 12/14/2021)      busPIRone (BUSPAR) 10 MG tablet Take 1 tablet (10 mg total) by mouth 2 (two) times daily. (Patient not taking: Reported on 12/14/2021) 180 tablet 0    hydrOXYzine (ATARAX) 25 MG tablet TAKE 1/2-1 TABLETS (12.5-25 MG TOTAL) BY MOUTH 2 (TWO) TIMES DAILY AS NEEDED. 180 tablet 1    methylphenidate (METADATE CD) 40 MG CR capsule Take 40 mg by mouth daily. (Patient not taking: Reported on 11/03/2021)      MYDAYIS 37.5 MG CP24 Take 1 capsule by mouth daily. (Patient not taking: Reported on 12/14/2021)       Patient Stressors: Financial difficulties   Legal issue   Marital or family conflict   Occupational concerns    Patient Strengths: Ability for Engineer, materials  Motivation for treatment/growth   Treatment Modalities: Medication Management, Group therapy, Case management,  1 to 1 session with clinician, Psychoeducation, Recreational therapy.   Physician Treatment Plan for Primary Diagnosis: MDD (major depressive disorder), recurrent, severe, with psychosis ([place]) Long Term Goal(s): Improvement in symptoms so as ready for discharge   Short Term Goals: Ability to identify changes in lifestyle to reduce recurrence of condition will improve Ability to verbalize feelings will improve Ability to disclose and discuss suicidal ideas Ability to demonstrate self-control will improve  Ability to identify and develop effective coping behaviors will improve Ability to maintain clinical measurements within normal limits will improve Compliance with prescribed medications will improve Ability to identify triggers associated with substance abuse/mental health issues will improve  Medication Management: Evaluate patient's response, side effects, and tolerance of medication regimen.  Therapeutic Interventions: 1 to 1 sessions, Unit Group sessions and Medication administration.  Evaluation of Outcomes: Progressing  Physician  Treatment Plan for Secondary Diagnosis: Principal Problem:   MDD (major depressive disorder), recurrent, severe, with psychosis ([facility]) Active Problems:   History of seizure   GAD (generalized anxiety disorder)   ADHD (attention deficit hyperactivity disorder), combined type   Cannabis use, uncomplicated  Long Term Goal(s): Improvement in symptoms so as ready for discharge   Short Term Goals: Ability to identify changes in lifestyle to reduce recurrence of condition will improve Ability to verbalize feelings will improve Ability to disclose and discuss suicidal ideas Ability to demonstrate self-control will improve Ability to identify and develop effective coping behaviors will improve Ability to maintain clinical measurements within normal limits will improve Compliance with prescribed medications will improve Ability to identify triggers associated with substance abuse/mental health issues will improve     Medication Management: Evaluate patient's response, side effects, and tolerance of medication regimen.  Therapeutic Interventions: 1 to 1 sessions, Unit Group sessions and Medication administration.  Evaluation of Outcomes: Progressing   RN Treatment Plan for Primary Diagnosis: MDD (major depressive disorder), recurrent, severe, with psychosis ([facility]) Long Term Goal(s): Knowledge of disease and therapeutic regimen to maintain health will improve  Short Term Goals: Ability to remain free from injury will improve, Ability to verbalize frustration and anger appropriately will improve, Ability to demonstrate self-control, Ability to participate in decision making will improve, Ability to verbalize feelings will improve, Ability to disclose and discuss suicidal ideas, Ability to identify and develop effective coping behaviors will improve, and Compliance with prescribed medications will improve  Medication Management: RN will administer medications as ordered by provider, will assess and  evaluate patient's response and provide education to patient for prescribed medication. RN will report any adverse and/or side effects to prescribing provider.  Therapeutic Interventions: 1 on 1 counseling sessions, Psychoeducation, Medication administration, Evaluate responses to treatment, Monitor vital signs and CBGs as ordered, Perform/monitor CIWA, COWS, AIMS and Fall Risk screenings as ordered, Perform wound care treatments as ordered.  Evaluation of Outcomes: Progressing   LCSW Treatment Plan for Primary Diagnosis: MDD (major depressive disorder), recurrent, severe, with psychosis ([facility]) Long Term Goal(s): Safe transition to appropriate next level of care at discharge, Engage patient in therapeutic group addressing interpersonal concerns.  Short Term Goals: Engage patient in aftercare planning with referrals and resources, Increase social support, Increase ability to appropriately verbalize feelings, Increase emotional regulation, Facilitate acceptance of mental health diagnosis and concerns, Facilitate patient progression through stages of change regarding substance use diagnoses and concerns, Identify triggers associated with mental health/substance abuse issues, and Increase skills for wellness and recovery  Therapeutic Interventions: Assess for all discharge needs, 1 to 1 time with Social worker, Explore available resources and support systems, Assess for adequacy in community support network, Educate family and significant other(s) on suicide prevention, Complete Psychosocial Assessment, Interpersonal group therapy.  Evaluation of Outcomes: Progressing   Progress in Treatment: Attending groups: Yes. Participating in groups: Yes. Taking medication as prescribed: Yes. Toleration medication: Yes. Family/Significant other contact made: Yes, individual(s) contacted:  CSW has contacted  Autumn Young [phone] pt's mother  Patient understands diagnosis:  Yes. Discussing patient  identified problems/goals with staff: Yes. Medical problems stabilized or resolved: Yes. Denies suicidal/homicidal ideation: No. Issues/concerns per patient self-inventory: Yes. Other: none  New problem(s) identified: No, Describe:  none  New Short Term/Long Term Goal(s): Patient to work towards Systems analyst for mood stabilization; elimination of SI thoughts; development of comprehensive mental wellness plan.  Patient Goals:  No additional goals identified at this time. Patient to continue to work towards original goals identified in initial treatment team meeting. CSW will remain available to patient should they voice additional treatment goals.   Discharge Plan or Barriers: No psychosocial barriers identified at this time, patient to return to place of residence when appropriate for discharge.   Reason for Continuation of Hospitalization: Depression  Estimated Length of Stay: 1-7 days    Last 3 [place] Suicide Severity Risk Score: Flowsheet Row [facility] ([facility]/14[facility]2021 in [facility] Office Visit from 11/03/2021 in confidential department  C-SSRS RISK CATEGORY High Risk High Risk Low Risk       Last PHQ 2/9 Scores:    11/03/2021    1:11 PM 07/21/2021    4:39 PM 06/23/2021    2:56 PM  Depression screen PHQ 2/9  Decreased Interest 0 0 0  Down, Depressed, Hopeless 2 0 0  PHQ - 2 Score 2 0 0  Altered sleeping 1    Tired, decreased energy 1    Change in appetite 2    Feeling bad or failure about yourself  3    Trouble concentrating 2    Moving slowly or fidgety/restless 0    Suicidal thoughts 1    PHQ-9 Score 12    Difficult doing work/chores Very difficult      Scribe for Treatment Team: Almedia Balls 12/21/2021 1:24 PM

## 2021-12-21 NOTE — BHH Group Notes (Signed)
Adult Psychoeducational Group Note  Date:  12/21/2021 Time:  2:15 PM  Group Topic/Focus:  Goals Group:   The focus of this group is to help patients establish daily goals to achieve during treatment and discuss how the patient can incorporate goal setting into their daily lives to aide in recovery.  Participation Level:  Active  Participation Quality:  Appropriate  Affect:  Appropriate  Cognitive:  Appropriate  Insight: Appropriate  Engagement in Group:  Engaged  Modes of Intervention:  Discussion  Additional Comments:  Patient attended morning orientation group & participated.   Keylan Costabile W Xavior Niazi 12/21/2021, 2:15 PM

## 2021-12-21 NOTE — Group Note (Signed)
Recreation Therapy Group Note   Group Topic:Stress Management  Group Date: 12/21/2021 Start Time: 0930 End Time: 0950 Facilitators: Caroll Rancher, [place] Location: [address] Dayroom   Goal Area(s) Addresses:  Patient will identify positive stress management techniques. Patient will identify benefits of using stress management post d/c.  Group Description:  Meditation.  LRT played a meditation that focused on being grounded, grateful and intentional for the day ahead.  Patients were to listen to the meditation and focus on centering themselves and allowing the energy to flow throughout their bodies to become relaxed and open to what the day ahead brings.     Affect/Mood: N/A   Participation Level: Did not attend    Clinical Observations/Individualized Feedback:     Plan: Continue to engage patient in RT group sessions 2-3x/week.   Caroll Rancher, LRT,CTRS 12/21/2021 11:45 AM

## 2021-12-21 NOTE — Group Note (Signed)
Occupational Therapy Group Note  Group Topic:Stress Management  Group Date: 12/21/2021 Start Time: 1400 End Time: 1445 Facilitators: Ted Mcalpine, OT   Group Description: Group encouraged increased participation and engagement through discussion focused on topic of stress management. Patients engaged interactively to discuss components of stress including physical signs, emotional signs, negative management strategies, and positive management strategies. Each individual identified one new stress management strategy they would like to try moving forward.    Therapeutic Goals: Identify current stressors Identify healthy vs unhealthy stress management strategies/techniques Discuss and identify physical and emotional signs of stress   Participation Level: Active and Engaged   Participation Quality: Independent   Behavior: Appropriate   Speech/Thought Process: Coherent and Directed   Affect/Mood: Appropriate   Insight: Good   Judgement: Good   Individualization: pt was active in their participation of group discussion/activity. New skills were identified  Modes of Intervention: Discussion and Education  Patient Response to Interventions:  Attentive, Engaged, and Interested    Plan: Continue to engage patient in OT groups 2 - 3x/week.  12/21/2021  Ted Mcalpine, OT  Kerrin Champagne, OT

## 2021-12-21 NOTE — Group Note (Signed)
LCSW Group Therapy Note   Group Date: 12/21/2021 Start Time: 1300 End Time: 1400  Type of Therapy and Topic:  Group Therapy: Thoughts, Feelings, and Actions  Participation Level:  Active   Description of Group:   In this group, each patient discussed their previous experiencing and understanding of overthinking, identifying the harmful impact on their lives. As a group, each patient was introduced to the basic concepts of Cognitive Behavioral Therapy: that thoughts, feelings, and actions are all connected and influence one another. They were given examples of how overthinking can affect our feelings, actions, and vise versa. The group was then asked to analyze how overthinking was harmful and brainstorm alternative thinking patterns/reactions to the example situation. Then, each group member filled out and identified their own example situation in which a problem situation caused their thoughts, feelings, and actions to be negatively impacted; they were asked to come up with 3 new (more adaptive/positive) thoughts that led to 3 new feelings and actions.  Therapeutic Goals: Patients will review and discuss their past experience with overthinking. Patients will learn the basics of the CBT model through group-led examples.. Patients will identify situations where they may have negative thoughts, feelings, or actions and will then reframe the situation using more positive thoughts to react differently.  Summary of Patient Progress:  Patient contributed to the discussion of how thoughts, feelings, and actions interact, noting when they may have experienced a negative thought pattern and recognized it as harmful. They were attentive when other patients shared their experiences, and worked to reframe their own thoughts in an activity to identify future situations where they may typically overthink.  Therapeutic Modalities:   Cognitive Behavioral Therapy Mindfulness  Aram Beecham,  [place] 12/21/2021  2:10 PM

## 2021-12-21 NOTE — Telephone Encounter (Signed)
received a fax that  escitalopram 20mg   needs a refill. pt last seen by Dr. 11-03-21 next appt is 01-22-22

## 2021-12-21 NOTE — Telephone Encounter (Signed)
Will hold this refill. She is currently in the hospital per chart review.

## 2021-12-21 NOTE — Progress Notes (Signed)
[facility]  12/21/2021 7:10 AM Melinda Mills  MRN:  [id number]  Subjective:  Melinda OuHollie Benavides is a 31 year-old woman with a reported past psychiatric history of MDD, anxiety, and ADHD, who was brought to the ED by a concerned friend after the friend was informed that the patient had prepared suicide notes for her family to find the next day. She was placed under involuntary commitment status due to suicidal ideation with intention and plan to overdose on pills.   Yesterday, the psychiatry team made following recommendations: -Continue Lexapro 20 mg once a day for MDD and GAD -Continue BuSpar 10 mg twice daily -Continue guanfacine 1 mg nightly for ADHD and impulse control -Continue Keppra 500 mg twice daily for seizure disorder -Continue Adderall XR 20 mg daily (substitute for home Mydayis during admission)  On assessment today, patient is found laying in bed with the lights off (at ~0800 after skipping breakfast) but is amenable to interview on approach. She describes her mood as "tired" and "annoyed" with tired but pleasant affect. Patient says that she has been upset since a phone call with her boyfriend in which she had a dispute but was cut off early due to call time limitations.  The patient reports significant changes in mood due to external stressors, that overall impact her ability to interact with other people without becoming overwhelmed, irritated or upset. Her appetite has improved but overnight she had worse sleep and low energy today due to stress. She started Adderall XR 20 mg as a substitute for home regimen (Mydayis) and endorses associated improvement in concentration and generally "feeling like my ADHD is better controlled". However, she does still complain of an ongoing sense of impulsivity and moderate-to-severe mood lability, being easily annoyed by things such as small noises.  Patient denies SI, HI, and AVH.  She also denies somatic symptoms other than a mild headache  7/29 which she reports as common for her and at baseline discomfort level.  Principal Problem: MDD (major depressive disorder), recurrent, severe, with psychosis (HCC)  Diagnosis: Principal Problem:   MDD (major depressive disorder), recurrent, severe, with psychosis (HCC) Active Problems:   History of seizure   GAD (generalized anxiety disorder)   ADHD (attention deficit hyperactivity disorder), combined type   Cannabis use, uncomplicated   Past Psychiatric History: Patient describes a historical diagnosis of ADHD from childhood which was re-evaluated and confirmed in 2020. Additionally, she was diagnosed with MDD during her first year of undergraduate (2012). She also describes significant anxiety and panic attacks which began shortly after the birth of her son (2019). She was treated for MDD with Zoloft in college but stopped at graduation due to inconvenience/lack of efficacy. However, in 2020 she began treatment with Lexapro for depression and anxiety with Buspar added to her regimen in 2021. Her ADHD was treated with methylphenidate in childhood. After ADHD re-diagnosis in 2020 she failed trials of Adderall ER and methylphenidate and has been maintained on Mydayis 37.5 mg daily. Two weeks prior to admission, she began a trial of guanfacine 1 mg daily for additional inadequately controlled ADHD symptoms such as impulsivity and agitation. She denies previous psychiatric hospitalization.  Patient reports that 1 time in college she drank excessive alcohol and intent to harm herself and overdose.  She denies other suicide attempts in the past  Past Medical History:  Past Medical History:  Diagnosis Date   ADHD (attention deficit hyperactivity disorder)    Depression    Seizures (HCC)  Past Surgical History:  Procedure Laterality Date   APPENDECTOMY  2008/2009   Family History:  Family History  Adopted: Yes   Family Psychiatric  History: None Indicated  Social History:  Social  History   Substance and Sexual Activity  Alcohol Use Not Currently     Social History   Substance and Sexual Activity  Drug Use Not Currently   Types: Marijuana    Social History   Socioeconomic History   Marital status: Legally Separated    Spouse name: Not on file   Number of children: 1   Years of education: Not on file   Highest education level: Bachelor's degree (e.g., BA, AB, BS)  Occupational History   Not on file  Tobacco Use   Smoking status: Never   Smokeless tobacco: Never  Vaping Use   Vaping Use: Never used  Substance and Sexual Activity   Alcohol use: Not Currently   Drug use: Not Currently    Types: Marijuana   Sexual activity: Yes  Other Topics Concern   Not on file  Social History Narrative   Not on file   Social Determinants of Health   Financial Resource Strain: Low Risk  (08/15/2018)   Overall Financial Resource Strain (CARDIA)    Difficulty of Paying Living Expenses: Not very hard  Food Insecurity: No Food Insecurity (02/09/2018)   Hunger Vital Sign    Worried About Running Out of Food in the Last Year: Never true    Ran Out of Food in the Last Year: Never true  Transportation Needs: No Transportation Needs (02/09/2018)   PRAPARE - Administrator, Civil Service (Medical): No    Lack of Transportation (Non-Medical): No  Physical Activity: Inactive (02/09/2018)   Exercise Vital Sign    Days of Exercise per Week: 0 days    Minutes of Exercise per Session: 0 min  Stress: No Stress Concern Present (02/09/2018)   [employer] of Occupational Health - Occupational Stress Questionnaire    Feeling of Stress : Not at all  Social Connections: Unknown (08/15/2018)   Social Connection and Isolation Panel [NHANES]    Frequency of Communication with Friends and Family: Not on file    Frequency of Social Gatherings with Friends and Family: Not on file    Attends Religious Services: Not on file    Active Member of Clubs or Organizations: Not  on file    Attends Banker Meetings: Not on file    Marital Status: Living with partner   Additional Social History:    Sleep: Poor. Self-reports "sleeping like crap" getting ~1.5 hours.  Appetite:  Improved to baseline, wanting meals x3 daily. Skipped breakfast this morning due to distress over recent call, as above.  Current Medications: Current Facility-Administered Medications  Medication Dose Route Frequency Provider Last Rate Last Admin   acetaminophen (TYLENOL) tablet 650 mg  650 mg Oral Q6H PRN Ntuen, Jesusita Oka, FNP       alum & mag hydroxide-simeth (MAALOX/MYLANTA) 200-200-20 MG/5ML suspension 30 mL  30 mL Oral Q4H PRN Ntuen, Jesusita Oka, FNP       amphetamine-dextroamphetamine (ADDERALL XR) 24 hr capsule 20 mg  20 mg Oral Daily Yahaira Bruski, MD   20 mg at 12/20/21 0733   busPIRone (BUSPAR) tablet 10 mg  10 mg Oral BID Christen Wardrop, Harrold Donath, MD   10 mg at 12/20/21 1604   escitalopram (LEXAPRO) tablet 20 mg  20 mg Oral Daily Bellah Alia, Harrold Donath, MD   20 mg  at 12/20/21 0733   guanFACINE (TENEX) tablet 1 mg  1 mg Oral QHS Cinsere Mizrahi, MD   1 mg at 12/20/21 2130   hydrOXYzine (ATARAX) tablet 25 mg  25 mg Oral TID PRN Cecilie Lowers, FNP   25 mg at 12/20/21 1955   levETIRAcetam (KEPPRA) tablet 500 mg  500 mg Oral BID Keyani Rigdon, Harrold Donath, MD   500 mg at 12/20/21 1605   magnesium hydroxide (MILK OF MAGNESIA) suspension 30 mL  30 mL Oral Daily PRN Ntuen, Jesusita Oka, FNP       traZODone (DESYREL) tablet 50 mg  50 mg Oral QHS PRN Ntuen, Jesusita Oka, FNP   50 mg at 12/20/21 2250    Lab Results: No results found for this or any previous visit (from the past 48 hour(s)).  Blood Alcohol level:  Lab Results  Component Value Date   ETH <10 12/14/2021    Metabolic Disorder Labs: Lab Results  Component Value Date   HGBA1C 4.8 12/16/2021   MPG 91.06 12/16/2021   No results found for: "PROLACTIN" Lab Results  Component Value Date   CHOL 198 12/16/2021   TRIG 61 12/16/2021   HDL 63  12/16/2021   CHOLHDL 3.1 12/16/2021   VLDL 12 12/16/2021   LDLCALC 123 (H) 12/16/2021    Physical Findings: AIMS: Facial and Oral Movements Muscles of Facial Expression: None, normal Lips and Perioral Area: None, normal Jaw: None, normal Tongue: None, normal,Extremity Movements Upper (arms, wrists, hands, fingers): None, normal Lower (legs, knees, ankles, toes): None, normal, Trunk Movements Neck, shoulders, hips: None, normal, Overall Severity Severity of abnormal movements (highest score from questions above): None, normal Incapacitation due to abnormal movements: None, normal Patient's awareness of abnormal movements (rate only patient's report): No Awareness, Dental Status Current problems with teeth and/or dentures?: No Does patient usually wear dentures?: No  CIWA:    COWS:     Musculoskeletal: Strength & Muscle Tone: within normal limits Gait & Station: normal Patient leans: N/A  Psychiatric Specialty Exam: Physical Exam Vitals and nursing note reviewed.  Constitutional:      Appearance: Normal appearance.  HENT:     Head: Normocephalic and atraumatic.     Right Ear: External ear normal.     Left Ear: External ear normal.     Nose: Nose normal.  Eyes:     Conjunctiva/sclera: Conjunctivae normal.  Cardiovascular:     Rate and Rhythm: Normal rate.     Comments:   Pulmonary:     Effort: Pulmonary effort is normal.  Skin:    Coloration: Skin is not jaundiced.  Neurological:     Mental Status: She is alert and oriented to person, place, and time.     Gait: Gait normal.  Psychiatric:        Behavior: Behavior normal.     Review of Systems  Constitutional:  Negative for diaphoresis and unexpected weight change.  HENT:  Negative for congestion and voice change.   Eyes:  Negative for redness.  Respiratory:  Negative for shortness of breath and wheezing.   Cardiovascular:  Negative for chest pain and palpitations.  Gastrointestinal:  Negative for  constipation, diarrhea and nausea.  Genitourinary:  Negative for dysuria and urgency.  Musculoskeletal:  Negative for gait problem and myalgias.  Skin:  Negative for rash and wound.  Neurological:  Negative for dizziness, numbness and headaches. Seizures: Hx of Seizure-like activity. Psychiatric/Behavioral:  Positive for agitation. Negative for decreased concentration, hallucinations and suicidal ideas. The patient is  nervous/anxious.     Blood pressure 120/89, pulse 87, temperature 98.4 F (36.9 C), temperature source Oral, resp. rate 16, height 5\' 3"  (1.6 m), weight 78 kg, SpO2 100 %.Body mass index is 30.47 kg/m.  General Appearance: Casual and appropriate for setting  Eye Contact:  Good  Speech:  Clear and Coherent and Normal Rate  Volume:  Normal  Mood:  Anxious and Irritable  Affect:  Appropriate, Full Range, and Pleasant  Thought Process:  Coherent and Linear  Orientation:  Full (Time, Place, and Person)  Thought Content:  Logical and Abstract Thinking Intact  Suicidal Thoughts:  No  Homicidal Thoughts:  No  Memory:  Immediate;   Good Recent;   Good Remote;   Good  Judgement:  Fair  Insight:  Fair  Psychomotor Activity:  Normal and Decreased Fidgeting  Concentration:  Concentration: Good and improved  Recall:  NA  Fund of Knowledge:  NA  Language:  NA  Akathisia:  Negative  Handed:  Right  AIMS (if indicated):     Assets:  Communication Skills Desire for Improvement Housing Physical Health Social Support  ADL's:  Intact  Cognition:  WNL  Sleep:  Number of Hours: 9.75 per RN; Self-Report ~1.5    Treatment Plan Summary: Daily contact with patient to assess and evaluate symptoms and progress in treatment and Medication management  ASSESSMENT:   Diagnoses / Active Problems: -MDD severe recurrent without psychosis -GAD with panic attacks -ADHD Rule out cannabis use disorder Rule out alcohol use disorder History of epilepsy +/-pseudoseizures ?  [place]  disease risk on genetic testing   PLAN: Safety and Monitoring:             -- Involuntary admission to inpatient psychiatric unit for safety, stabilization and treatment             -- Daily contact with patient to assess and evaluate symptoms and progress in treatment             -- Patient's case to be discussed in multi-disciplinary team meeting             -- Observation Level : q15 minute checks             -- Vital signs:  q12 hours             -- Precautions: suicide, elopement, and assault   2. Psychiatric Diagnoses and Treatment:               -Continue Lexapro 20 mg once a day for MDD and GAD -Continue BuSpar 10 mg twice daily -Continue guanfacine 1 mg nightly for ADHD and impulse control -Continue Keppra 500 mg twice daily for seizure disorder -Continue Adderall XR 20 mg daily (substitute for home Mydayis during admission) -Continue hydroxyzine 25 mg three times daily PRN for anxiety -Continue trazodone 50 mg at bedtime PRN for sleep -Start Abilify 5 mg daily for mood lability and impulsiveness associated with emotional extremes   --  The risks/benefits/side-effects/alternatives to this medication were discussed in detail with the patient and time was given for questions. The patient consents to medication trial.              -- Metabolic profile and EKG monitoring obtained while on an atypical antipsychotic (BMI: 30.47 Lipid Panel: Total 198, Triglycerides 61, HDL 63, LDL 123 (high), VLDL 12 HbgA1c: 4.8% QTc: 439)              -- Encouraged patient to participate in unit  milieu and in scheduled group therapies    3. Medical Issues Being Addressed:                Tobacco Use Disorder             -- Nicotine patch gum prn               Historical dx of Unspecified Seizure Disorder -Continue Keppra for h/o seizure d/o and recent seizure like activity. Pt will need to f/u with neurologist  -will recommend not driving until neurologist eval, at dc   4. Discharge Planning:               -- Social work and case management to assist with discharge planning and identification of hospital follow-up needs prior to discharge             -- Estimated LOS: 5-7 days             -- Discharge Concerns: Need to establish a safety plan; Medication compliance and effectiveness             -- Discharge Goals: Return home with outpatient referrals for mental health follow-up including medication management/psychotherapy  -- Anticipated discharge 8/1-8/2 pending medication tolerance and demonstrated improvement in mood responsiveness  Illene Regulus, MS4, Rosana Berger  Total Time Spent in Direct Patient Care:  I personally spent 35 minutes on the unit in direct patient care. The direct patient care time included face-to-face time with the patient, reviewing the patient's chart, communicating with other professionals, and coordinating care. Greater than 50% of this time was spent in counseling or coordinating care with the patient regarding goals of hospitalization, psycho-education, and discharge planning needs.  I personally was present and performed or re-performed the history, physical exam and medical decision-making activities of this service and have verified that the service and findings are accurately documented in the student's note, , as addended by me or notated below:  I directly edited the note, as above. We are starting mood stabilizer, to address symptoms of mood instability that did not respond to restarting ADHD treatment.  Patient reports her irritability and mood instability impact her ability to maintain stable relationship and interactions with others including loved ones and peers.  Phineas Inches, MD Psychiatrist

## 2021-12-22 ENCOUNTER — Ambulatory Visit (INDEPENDENT_AMBULATORY_CARE_PROVIDER_SITE_OTHER): Payer: BC Managed Care – PPO | Admitting: Clinical

## 2021-12-22 ENCOUNTER — Encounter (HOSPITAL_COMMUNITY): Payer: Self-pay

## 2021-12-22 DIAGNOSIS — F411 Generalized anxiety disorder: Secondary | ICD-10-CM

## 2021-12-22 DIAGNOSIS — F331 Major depressive disorder, recurrent, moderate: Secondary | ICD-10-CM

## 2021-12-22 MED ORDER — GUANFACINE HCL 1 MG PO TABS: 1.0000 mg | ORAL_TABLET | Freq: Every day | ORAL | 0 refills | Status: DC

## 2021-12-22 MED ORDER — LEVETIRACETAM 500 MG PO TABS
500.0000 mg | ORAL_TABLET | Freq: Two times a day (BID) | ORAL | 0 refills | Status: DC
Start: 2021-12-22 — End: 2022-04-06

## 2021-12-22 MED ORDER — TRAZODONE HCL 50 MG PO TABS: 50.0000 mg | ORAL_TABLET | Freq: Every evening | ORAL | 0 refills | Status: DC | PRN

## 2021-12-22 MED ORDER — HYDROXYZINE HCL 25 MG PO TABS: ORAL_TABLET | ORAL | 0 refills | Status: AC

## 2021-12-22 MED ORDER — ESCITALOPRAM OXALATE 20 MG PO TABS
20.0000 mg | ORAL_TABLET | Freq: Every day | ORAL | 0 refills | Status: DC
Start: 2021-12-23 — End: 2022-01-19

## 2021-12-22 MED ORDER — BUSPIRONE HCL 10 MG PO TABS: 10.0000 mg | ORAL_TABLET | Freq: Two times a day (BID) | ORAL | 0 refills | Status: AC

## 2021-12-22 MED ORDER — ARIPIPRAZOLE 5 MG PO TABS
5.0000 mg | ORAL_TABLET | Freq: Every day | ORAL | 0 refills | Status: DC
Start: 2021-12-23 — End: 2022-01-22

## 2021-12-22 MED ORDER — MYDAYIS 37.5 MG PO CP24
1.0000 | ORAL_CAPSULE | Freq: Every day | ORAL | 0 refills | Status: DC
Start: 2021-12-22 — End: 2022-02-22

## 2021-12-22 NOTE — Progress Notes (Signed)
Pt discharged to lobby. Pt was stable and appreciative at that time. All papers including suicide safety plan and prescriptions were given and valuables returned. Verbal understanding expressed. Denies SI/HI and A/VH. Pt given opportunity to express concerns and ask questions.  

## 2021-12-22 NOTE — Progress Notes (Signed)
  [facility] Adult Case Management Discharge Plan :  Will you be returning to the same living situation after discharge:  Yes,  Home  At discharge, do you have transportation home?: Yes,  Father Do you have the ability to pay for your medications: Yes,  [employer]   Release of information consent forms completed and in the chart;  Patient's signature needed at discharge.  Patient to Follow up at:  Follow-up Information     [facility]. Go on 01/22/2022.   Specialty: Behavioral Health Why: * PLEASE CALL TO CANCEL IF YOU MOVE OUTSIDE [place].  You have an appointment for medication management services on 01/22/22 at 11:00 am.  This appointment will be held in person. Contact information: [address] [place] [address] [phone]        [facility] Follow up on 12/22/2021.   Specialty: Behavioral Health Why: You have an appointment for therapy services on  12/22/21 at 4:00 pm.  This will be a Virtual appointment. Contact information: [address] [place] [address] [phone]        [facility]. Call.   Why: Please call this provider to schedule an appointment for medication management and/or therapy services. Contact information: [address] [place], [place] [address]  Phone: [phone] Fax: [phone]                Next level of care provider has access to [facility] Link:yes  Safety Planning and Suicide Prevention discussed: Yes,  with patient and mother      Has patient been referred to the Quitline?: N/A patient is not a smoker  Patient has been referred for addiction treatment: N/A  Aram Beecham, LCSWA 12/22/2021, 8:58 AM
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
## 2021-12-22 NOTE — [facility] Suicide Risk Assessment (Signed)
[facility] Discharge Suicide Risk Assessment   Principal Problem: MDD (major depressive disorder), recurrent, severe, with psychosis (HCC) Discharge Diagnoses: Principal Problem:   MDD (major depressive disorder), recurrent, severe, with psychosis (HCC) Active Problems:   History of seizure   GAD (generalized anxiety disorder)   ADHD (attention deficit hyperactivity disorder), combined type   Cannabis use, uncomplicated   Total Time spent with patient: 15 minutes   Melinda Mills is a 31 year-old woman with a reported past psychiatric history of MDD, anxiety, and ADHD, who was brought to the ED by a concerned friend after the friend was informed that the patient had prepared suicide notes for her family to find the next day. She was placed under involuntary commitment status due to suicidal ideation with intention and plan to overdose on pills.   During the patient's hospitalization, patient had extensive initial psychiatric evaluation, and follow-up psychiatric evaluations every day.  Psychiatric diagnoses provided upon initial assessment:  -MDD severe recurrent without psychosis -GAD with panic attacks -ADHD Rule out cannabis use disorder Rule out alcohol use disorder History of epilepsy +/-pseudoseizures ?  [place] disease risk on genetic testing  Patient's psychiatric medications were adjusted on admission:  -Continue Lexapro 20 mg once a day for MDD and GAD -Increase BuSpar from 10 mg once daily to 10 mg twice daily -Continue guanfacine 1 mg nightly for ADHD and impulse control -Start Keppra 500 mg twice daily for seizure disorder -Restart Mydayis 25 mg qam for adhd   During the hospitalization, other adjustments were made to the patient's psychiatric medication regimen:  --Mydayis could not be restarted, therefore substitution of adderall XR 20 mg onec daily was started.  -Started abilify 5 mg once daily for mood stabilization was started  Patient's care was discussed during  the interdisciplinary team meeting every day during the hospitalization.  The patient denied having side effects to prescribed psychiatric medication.  Gradually, patient started adjusting to milieu. The patient was evaluated each day by a clinical provider to ascertain response to treatment. Improvement was noted by the patient's report of decreasing symptoms, improved sleep and appetite, affect, medication tolerance, behavior, and participation in unit programming.  Patient was asked each day to complete a self inventory noting mood, mental status, pain, new symptoms, anxiety and concerns.    Symptoms were reported as significantly decreased or resolved completely by discharge.   On day of discharge, the patient reports that their mood is stable. The patient denied having suicidal thoughts for more than 48 hours prior to discharge.  Patient denies having homicidal thoughts.  Patient denies having auditory hallucinations.  Patient denies any visual hallucinations or other symptoms of psychosis. The patient was motivated to continue taking medication with a goal of continued improvement in mental health.   The patient reports their target psychiatric symptoms of depression, irritability, mood lability, impulsivity, and suicidal thoughts, all responded well to the psychiatric medications, and the patient reports overall benefit other psychiatric hospitalization. Supportive psychotherapy was provided to the patient. The patient also participated in regular group therapy while hospitalized. Coping skills, problem solving as well as relaxation therapies were also part of the unit programming.  Labs were reviewed with the patient, and abnormal results were discussed with the patient.  The patient is able to verbalize their individual safety plan to this provider.  # It is recommended to the patient to continue psychiatric medications as prescribed, after discharge from the hospital.    # It is  recommended to the patient to  follow up with your outpatient psychiatric provider and PCP.  # It was discussed with the patient, the impact of alcohol, drugs, tobacco have been there overall psychiatric and medical wellbeing, and total abstinence from substance use was recommended the patient.ed.  # Prescriptions provided or sent directly to preferred pharmacy at discharge. Patient agreeable to plan. Given opportunity to ask questions. Appears to feel comfortable with discharge.    # In the event of worsening symptoms, the patient is instructed to call the crisis hotline, 911 and or go to the nearest ED for appropriate evaluation and treatment of symptoms. To follow-up with primary care provider for other medical issues, concerns and or health care needs  # Patient was discharged home with a plan to follow up as noted below.     Psychiatric Specialty Exam  Presentation  General Appearance: Casual; Fairly Groomed  Eye Contact:Good  Speech:Clear and Coherent; Normal Rate  Speech Volume:Normal  Handedness:Right   Mood and Affect  Mood:Euthymic  Duration of Depression Symptoms: Greater than two weeks  Affect:Congruent; Constricted; Full Range   Thought Process  Thought Processes:Linear  Descriptions of Associations:Intact  Orientation:Full (Time, Place and Person)  Thought Content:Logical  History of Schizophrenia/Schizoaffective disorder:No  Duration of Psychotic Symptoms:No data recorded Hallucinations:Hallucinations: None  Ideas of Reference:None  Suicidal Thoughts:Suicidal Thoughts: No  Homicidal Thoughts:Homicidal Thoughts: No   Sensorium  Memory:Immediate Good; Recent Good; Remote Good  Judgment:Fair  Insight:Fair   Executive Functions  Concentration:Fair  Attention Span:Fair  Recall:Fair  Fund of Knowledge:Good  Language:Good   Psychomotor Activity  Psychomotor Activity:Psychomotor Activity: Normal (no eps on day of dc. aims score zero  on day of dc.)   Assets  Assets:Communication Skills; Desire for Improvement; Social Support; Physical Health; Vocational/Educational   Sleep  Sleep:Sleep: Fair   Physical Exam: Physical Exam See discharge summary  ROS See discharge summary  Blood pressure 93/65, pulse 83, temperature 98.1 F (36.7 C), temperature source Oral, resp. rate 12, height 5\' 3"  (1.6 m), weight 78 kg, SpO2 100 %. Body mass index is 30.47 kg/m.  Mental Status Per Nursing Assessment::   On Admission:  Suicidal ideation indicated by patient  Demographic Factors:  Adolescent or young adult and Divorced or widowed  Loss Factors: Loss of significant relationship  Historical Factors: Prior suicide attempts and Impulsivity  Risk Reduction Factors:   Responsible for children under 48 years of age, Sense of responsibility to family, Employed, Positive social support, Positive therapeutic relationship, and Positive coping skills or problem solving skills  Continued Clinical Symptoms:  MDD- mood is stable. Denies SI.   Cognitive Features That Contribute To Risk:  None    Suicide Risk:  Mild:  There are no identifiable suicide plans, no associated intent, mild dysphoria and related symptoms, good self-control (both objective and subjective assessment), few other risk factors, and identifiable protective factors, including available and accessible social support.    Follow-up Information     [facility]. Go on 01/22/2022.   Specialty: Behavioral Health Why: * PLEASE CALL TO CANCEL IF YOU MOVE OUTSIDE [place].  You have an appointment for medication management services on 01/22/22 at 11:00 am.  This appointment will be held in person. Contact information: [address] [facility] [place] [phone]        [facility] Follow up on 12/22/2021.   Specialty: Behavioral Health Why: You  have an appointment for therapy services on  12/22/21 at 4:00 pm.  This will be a Virtual appointment. Contact information: [address] [place] [address] [phone]        [facility]. Call.   Why: Please call this provider to schedule an appointment for medication management and/or therapy services. Contact information: [address] [place], [place] [address]  Phone: [phone] Fax: [phone]                Plan Of Care/Follow-up recommendations:   Activity: as tolerated  Diet: heart healthy  Other: -Follow-up with your outpatient psychiatric provider -instructions on appointment date, time, and address (location) are provided to you in discharge paperwork.  -Take your psychiatric medications as prescribed at discharge - instructions are provided to you in the discharge paperwork  -Follow-up with outpatient primary care doctor and other specialists -for management of preventative medicine and chronic medical disease, including concern for seizures.   -Testing: Follow-up with outpatient provider for abnormal lab results:  Abnml lipid panel  -Recommend abstinence from alcohol, tobacco, and other illicit drug use at discharge.   -If your psychiatric symptoms recur, worsen, or if you have side effects to your psychiatric medications, call your outpatient psychiatric provider, 911, [phone] or go to the nearest emergency department.  -If suicidal thoughts recur, call your outpatient psychiatric provider, 911, [phone] or go to the nearest emergency department.   Cristy Hilts, MD 12/22/2021, 10:15 AM

## 2021-12-22 NOTE — [facility] Group Notes (Signed)
Patient attended Pharmacy group .  

## 2021-12-22 NOTE — Discharge Summary (Signed)
Physician Discharge Summary Note  Patient:  Melinda Mills is an 31 y.o., female MRN:  [id number] DOB:  13-Jan-1991 Patient phone:  [phone] (home)  Patient address:   [address] [place] [address],  Total Time spent with patient: 15 minutes  Date of Admission:  12/15/2021 Date of Discharge: 12/22/2021  Reason for Admission:   Melinda Mills is a 22 year-old woman with a reported past psychiatric history of MDD, anxiety, and ADHD, who was brought to the ED by a concerned friend after the friend was informed that the patient had prepared suicide notes for her family to find the next day. She was placed under involuntary commitment status due to suicidal ideation with intention and plan to overdose on pills.   Prior to admission psychiatric medications: Patient describes cessation of all psychiatric medications in June of this year, then restarting lexapro, buspar and guanfacine about 1 month ago. Of note, she is a [employer] scheme manager and does not take ADHD medication (Mydayis) during summer months.  However, prior to cessation of medications in June her regimen included: -Buspar 10 mg BID (taken as once daily) -Lexapro 20 mg daily -Guanfacine 1 mg daily -Mydayis 37.5 mg daily   Per patient, her psychiatric diagnoses were adequately controlled with her historical regimen (as below) until three months ago when a series of significant stressors appeared in her life. The patient began the process of separation from her husband approximately three months prior to admission after she impulsively "made out" with two other partners (denies sexual contact). Approximately two months prior to admission, an important ex-boyfriend returned to town after 8 years away, and dealing with the dynamic of seeing her ex-boyfriend and separating from her husband has been a significant stressor. Over these months, the patient describes increasing struggles with hyperactivity, restlessness, difficulty  relaxing, being too talkative, acting without thinking, impatience, interrupting others, spending too much, impulsive intense relationships, not being able to postpone gratification, sensation seeking and risk-taking behaviors, mood lability, low frustration tolerance, emotional impulsivity, irritability, and anger outburst.  Patient reports that suicidal thoughts were impulsive, and that she researched which pills would be more lethal, adequate quantity, and estimated the amount of pills she would need to achieve lethality.   Psychiatric review of systems: Patient reports that her mood has been dysphoric but denies pervasive sadness and anhedonia for the weeks leading up to this admission.  She does report poor sleep, about 4 hours per sleep per night.  Reports that energy is up and down.  Reports that appetite is normal (of note, patient reports low appetite when she is on her stimulant medication).  Reports difficulty with concentration, that is chronic.  At this time she denies having any suicidal thoughts and homicidal thoughts.  Denies any psychotic symptoms.  Reports anxiety is excessive, generalized, chronic, elevated.  Reports having panic attacks about once per week.  In regards to symptoms of hypomanic or manic episodes, at this time she does not meet criteria for having a hypomanic or manic episode in the past, but she does describe symptoms such as periods of time of elevated mood lasting for about 3 days, when she has more impulsivity, spending more money, is more talkative.  We discussed at length, that this is more likely due to untreated ADHD hyperactivity and impulsive symptoms. She reports a history of traumatic neglect  Principal Problem: MDD (major depressive disorder), recurrent, severe, with psychosis (HCC) Discharge Diagnoses: Principal Problem:   MDD (major depressive disorder), recurrent,  severe, with psychosis (HCC) Active Problems:   History of seizure   GAD (generalized  anxiety disorder)   ADHD (attention deficit hyperactivity disorder), combined type   Cannabis use, uncomplicated   Past Psychiatric History: Patient describes a historical diagnosis of ADHD from childhood which was re-evaluated and confirmed in 2020. Additionally, she was diagnosed with MDD during her first year of undergraduate (2012). She also describes significant anxiety and panic attacks which began shortly after the birth of her son (2019). She was treated for MDD with Zoloft in college but stopped at graduation due to inconvenience/lack of efficacy. However, in 2020 she began treatment with Lexapro for depression and anxiety with Buspar added to her regimen in 2021. Her ADHD was treated with methylphenidate in childhood. After ADHD re-diagnosis in 2020 she failed trials of Adderall ER and methylphenidate and has been maintained on Mydayis 37.5 mg daily. Two weeks prior to admission, she began a trial of guanfacine 1 mg daily for additional inadequately controlled ADHD symptoms such as impulsivity and agitation. She denies previous psychiatric hospitalization.  Patient reports that 1 time in college she drank excessive alcohol and intent to harm herself and overdose.  She denies other suicide attempts in the past.   Social history: Living in home in [place]. Separating from husband. Has a 29-year-old son.  Employed as Pension scheme managerspecial education teacher.   Substance use history: Rare tobacco use, once per month to once per week.  Not regular.  Smokes marijuana in the evenings.  Reports binge type alcohol use, but has behavioral outburst during this time, and is also negatively affected her life.  Rule out alcohol use disorder.  Past Medical History:  Past Medical History:  Diagnosis Date   ADHD (attention deficit hyperactivity disorder)    Depression    Seizures (HCC)     Past Surgical History:  Procedure Laterality Date   APPENDECTOMY  2008/2009   Family History:  Family History  Adopted:  Yes   Family Psychiatric  History: No known clinically significant family psychiatric history.  Social History:  Social History   Substance and Sexual Activity  Alcohol Use Not Currently     Social History   Substance and Sexual Activity  Drug Use Not Currently   Types: Marijuana    Social History   Socioeconomic History   Marital status: Legally Separated    Spouse name: Not on file   Number of children: 1   Years of education: Not on file   Highest education level: Bachelor's degree (e.g., BA, AB, BS)  Occupational History   Not on file  Tobacco Use   Smoking status: Never   Smokeless tobacco: Never  Vaping Use   Vaping Use: Never used  Substance and Sexual Activity   Alcohol use: Not Currently   Drug use: Not Currently    Types: Marijuana   Sexual activity: Yes  Other Topics Concern   Not on file  Social History Narrative   Not on file   Social Determinants of Health   Financial Resource Strain: Low Risk  (08/15/2018)   Overall Financial Resource Strain (CARDIA)    Difficulty of Paying Living Expenses: Not very hard  Food Insecurity: No Food Insecurity (02/09/2018)   Hunger Vital Sign    Worried About Running Out of Food in the Last Year: Never true    Ran Out of Food in the Last Year: Never true  Transportation Needs: No Transportation Needs (02/09/2018)   PRAPARE - Transportation    Lack of  Transportation (Medical): No    Lack of Transportation (Non-Medical): No  Physical Activity: Inactive (02/09/2018)   Exercise Vital Sign    Days of Exercise per Week: 0 days    Minutes of Exercise per Session: 0 min  Stress: No Stress Concern Present (02/09/2018)   [employer] of Occupational Health - Occupational Stress Questionnaire    Feeling of Stress : Not at all  Social Connections: Unknown (08/15/2018)   Social Connection and Isolation Panel [NHANES]    Frequency of Communication with Friends and Family: Not on file    Frequency of Social Gatherings with  Friends and Family: Not on file    Attends Religious Services: Not on file    Active Member of Clubs or Organizations: Not on file    Attends Banker Meetings: Not on file    Marital Status: Living with partner    Hospital Course: During the patient's hospitalization, patient had extensive initial psychiatric evaluation, and follow-up psychiatric evaluations every day.  Psychiatric diagnoses provided upon initial assessment: -MDD severe recurrent without psychosis -GAD with panic attacks -ADHD -Rule out cannabis use disorder -Rule out alcohol use disorder -History of epilepsy +/-pseudoseizures  Our conceptualization of the patient's pathology was that her presenting symptoms were most likely 2/2 untreated ADHD, such as: Difficulty organizing and planning, difficulty with sleeping, difficulty with focus, restlessness, difficulty relaxing, talking too much, fidgeting, not being able to accomplish tasks due to restlessness, not able to sit still, restless sleep, acting without thinking, difficulty waiting her turn, blurting things out, interrupting others, impatience, spending too much, starting relationships quickly, not being able to postpone gratification, sensation seeking and risk-taking behaviors, mood lability, low frustration tolerance, emotional impulsivity, irritability, and anger outburst.  We therefore restarted on her medication and observe her system.  She did have improvement overall of all the symptoms, but she continued to have some mood lability that did not fully respond to restarting ADHD medication, it was decided severity symptoms, to start Abilify for mood stabilization.  The patient reports Abilify was very helpful for mood stability, in addition to her other medications.  Patient's psychiatric medications were adjusted on admission: -Continue Lexapro 20 mg once a day for MDD and GAD -Increase BuSpar from 10 mg once daily to 10 mg twice daily -Continue  guanfacine 1 mg nightly for ADHD and impulse control -Start Keppra 500 mg twice daily for seizure disorder -Restart Mydayis 25 mg qam for adhd - will restart at lower dose as pt has not taken for a few months.  During the hospitalization, other adjustments were made to the patient's psychiatric medication regimen: -Adderall XR 20 mg daily was used as substitute for Mydayis (could not be restarted while inpatient) -Started Abilify 5 mg once daily for mood stabilization  Patient's care was discussed during the interdisciplinary team meeting every day during the hospitalization.  The patient denied having side effects to prescribed psychiatric medication.  Gradually, patient started adjusting to milieu. The patient was evaluated each day by a clinical provider to ascertain response to treatment. Improvement was noted by the patient's report of decreasing symptoms, improved sleep and appetite, affect, medication tolerance, behavior, and participation in unit programming.  Patient was asked each day to complete a self inventory noting mood, mental status, pain, new symptoms, anxiety and concerns.    Symptoms were reported as significantly decreased or resolved completely by discharge.   On day of discharge, the patient reports that their mood is stable. The patient denied having suicidal  thoughts for more than 48 hours prior to discharge.  Patient denies having homicidal thoughts.  Patient denies having auditory hallucinations.  Patient denies any visual hallucinations or other symptoms of psychosis. The patient was motivated to continue taking medication with a goal of continued improvement in mental health.   The patient reports their target psychiatric symptoms of depressed mood, irritability, impulsivity, and suicidal thoughts all responded well to the psychiatric medications, and the patient reports overall benefit other psychiatric hospitalization. Supportive psychotherapy was provided to the  patient. The patient also participated in regular group therapy while hospitalized. Coping skills, problem solving as well as relaxation therapies were also part of the unit programming.  Labs were reviewed with the patient, and abnormal results were discussed with the patient.  The patient is able to verbalize their individual safety plan to this provider.  # It is recommended to the patient to continue psychiatric medications as prescribed, after discharge from the hospital.    # It is recommended to the patient to follow up with your outpatient psychiatric provider and PCP.  # It was discussed with the patient, the impact of alcohol, drugs, tobacco have been there overall psychiatric and medical wellbeing, and total abstinence from substance use was recommended the patient.ed.  # Prescriptions provided or sent directly to preferred pharmacy at discharge. Patient agreeable to plan. Given opportunity to ask questions. Appears to feel comfortable with discharge.    # In the event of worsening symptoms, the patient is instructed to call the crisis hotline, 911 and or go to the nearest ED for appropriate evaluation and treatment of symptoms. To follow-up with primary care provider for other medical issues, concerns and or health care needs  # Patient was discharged home with a plan to follow up as noted below.   Psychiatric Specialty Exam: Physical Exam Vitals and nursing note reviewed.  Constitutional:      General: She is not in acute distress. HENT:     Head: Normocephalic and atraumatic.  Eyes:     Conjunctiva/sclera: Conjunctivae normal.  Pulmonary:     Effort: Pulmonary effort is normal.  Skin:    General: Skin is warm and dry.  Neurological:     Mental Status: She is alert and oriented to person, place, and time. Mental status is at baseline.  Psychiatric:        Mood and Affect: Mood normal.        Behavior: Behavior normal.        Thought Content: Thought content normal.         Judgment: Judgment normal.     Review of Systems  Constitutional:  Negative for chills, diaphoresis, fatigue and fever.  HENT:  Negative for congestion, drooling and sinus pressure.   Eyes:  Negative for photophobia and visual disturbance.  Respiratory:  Negative for cough, chest tightness, shortness of breath and stridor.   Cardiovascular:  Negative for chest pain and palpitations.  Gastrointestinal:  Negative for abdominal distention, abdominal pain, constipation, diarrhea, nausea and vomiting.  Endocrine: Negative for cold intolerance, heat intolerance and polyuria.  Genitourinary: Negative.   Musculoskeletal:  Negative for arthralgias, gait problem and myalgias.  Skin:  Negative for color change and rash.  Allergic/Immunologic: Negative for environmental allergies and immunocompromised state.  Neurological:  Negative for dizziness, tremors, seizures, weakness, numbness and headaches.  Hematological: Negative.   Psychiatric/Behavioral:  Negative for agitation, behavioral problems, confusion, decreased concentration, dysphoric mood, hallucinations, self-injury, sleep disturbance and suicidal ideas. The patient is not  nervous/anxious and is not hyperactive.     Blood pressure 111/63, pulse 76, temperature 98.1 F (36.7 C), temperature source Oral, resp. rate 12, height 5\' 3"  (1.6 m), weight 78 kg, SpO2 100 %.Body mass index is 30.47 kg/m.  General Appearance: Casual and Fairly Groomed  Eye Contact:  Good  Speech:  Normal Rate  Volume:  Normal  Mood:  Euthymic  Affect:  Appropriate and Congruent  Thought Process:  Coherent and Linear  Orientation:  Full (Time, Place, and Person)  Thought Content:  WDL and Logical  Suicidal Thoughts:  No  Homicidal Thoughts:  No  Memory:  Immediate;   Good Recent;   Good Remote;   Good  Judgement:  Intact  Insight:  Good  Psychomotor Activity:  Normal  Concentration:  Attention Span: Good  Recall:  NA  Fund of Knowledge:  NA  Language:   Good  Akathisia:  No  Handed:  Right  AIMS (if indicated):     Assets:  Communication Skills Desire for Improvement Housing Physical Health Social Support  ADL's:  Intact  Cognition:  WNL  Sleep:  Number of Hours: 7.25     Social History   Tobacco Use  Smoking Status Never  Smokeless Tobacco Never   Tobacco Cessation:  A prescription for an FDA-approved tobacco cessation medication provided at discharge   Blood Alcohol level:  Lab Results  Component Value Date   ETH <10 12/14/2021    Metabolic Disorder Labs:  Lab Results  Component Value Date   HGBA1C 4.8 12/16/2021   MPG 91.06 12/16/2021   No results found for: "PROLACTIN" Lab Results  Component Value Date   CHOL 198 12/16/2021   TRIG 61 12/16/2021   HDL 63 12/16/2021   CHOLHDL 3.1 12/16/2021   VLDL 12 12/16/2021   LDLCALC 123 (H) 12/16/2021    See Psychiatric Specialty Exam and Suicide Risk Assessment completed by Attending Physician prior to discharge.  Discharge destination:  Home  Is patient on multiple antipsychotic therapies at discharge:  No   Has Patient had three or more failed trials of antipsychotic monotherapy by history:  No  Recommended Plan for Multiple Antipsychotic Therapies: NA  Discharge Instructions     Diet - low sodium heart healthy   Complete by: As directed    Increase activity slowly   Complete by: As directed       Allergies as of 12/22/2021   No Known Allergies      Medication List     STOP taking these medications    Balziva 0.4-35 MG-MCG tablet Generic drug: norethindrone-ethinyl estradiol   methylphenidate 40 MG CR capsule Commonly known as: METADATE CD       TAKE these medications      Indication  ARIPiprazole 5 MG tablet Commonly known as: ABILIFY Take 1 tablet (5 mg total) by mouth daily. Start taking on: December 23, 2021  Indication: Major Depressive Disorder   busPIRone 10 MG tablet Commonly known as: BUSPAR Take 1 tablet (10 mg total) by  mouth 2 (two) times daily.  Indication: Major Depressive Disorder   escitalopram 20 MG tablet Commonly known as: LEXAPRO Take 1 tablet (20 mg total) by mouth daily. Start taking on: December 23, 2021  Indication: Major Depressive Disorder   guanFACINE 1 MG tablet Commonly known as: TENEX Take 1 tablet (1 mg total) by mouth at bedtime.  Indication: adhd   hydrOXYzine 25 MG tablet Commonly known as: ATARAX TAKE 1/2-1 TABLETS (12.5-25 MG TOTAL) BY MOUTH  2 (TWO) TIMES DAILY AS NEEDED.  Indication: Feeling Anxious   levETIRAcetam 500 MG tablet Commonly known as: KEPPRA Take 1 tablet (500 mg total) by mouth 2 (two) times daily.  Indication: Seizure   Mydayis 37.5 MG Cp24 Generic drug: Amphet-Dextroamphet 3-Bead ER Take 1 capsule by mouth daily. Take 25 mg once daily dose at discharge. Pt already has a prescription. What changed: additional instructions  Indication: Attention Deficit Hyperactivity Disorder   traZODone 50 MG tablet Commonly known as: DESYREL Take 1 tablet (50 mg total) by mouth at bedtime as needed for sleep.  Indication: Trouble Sleeping, Major Depressive Disorder        Follow-up Information     [facility]. Go on 01/22/2022.   Specialty: Behavioral Health Why: * PLEASE CALL TO CANCEL IF YOU MOVE OUTSIDE [place].  You have an appointment for medication management services on 01/22/22 at 11:00 am.  This appointment will be held in person. Contact information: [address] [place] [address] [phone]        [facility] Follow up on 12/22/2021.   Specialty: Behavioral Health Why: You have an appointment for therapy services on  12/22/21 at 4:00 pm.  This will be a Virtual appointment. Contact information: [address] [place] [address] [phone]        [facility]. Call.   Why: Please call  this provider to schedule an appointment for medication management and/or therapy services. Contact information: [address] [place], [place] [address]  Phone: [phone] Fax: [phone]                Activity: as tolerated   Diet: heart healthy   Other: -Follow-up with your outpatient psychiatric provider -instructions on appointment date, time, and address (location) are provided to you in discharge paperwork.   -Take your psychiatric medications as prescribed at discharge - instructions are provided to you in the discharge paperwork   -Follow-up with outpatient primary care doctor and other specialists -for management of preventative medicine and chronic medical disease, including concern for seizures.    -Testing: Follow-up with outpatient provider for abnormal lab results:  Abnml lipid panel   -Recommend abstinence from alcohol, tobacco, and other illicit drug use at discharge.    -If your psychiatric symptoms recur, worsen, or if you have side effects to your psychiatric medications, call your outpatient psychiatric provider, 911, [phone] or go to the nearest emergency department.   -If suicidal thoughts recur, call your outpatient psychiatric provider, 911, 988 or go to the nearest emergency department.  Illene Regulus, MS4, [employer]CSOM  Total Time Spent in Direct Patient Care:  I personally spent 35 minutes on the unit in direct patient care. The direct patient care time included face-to-face time with the patient, reviewing the patient's chart, communicating with other professionals, and coordinating care. Greater than 50% of this time was spent in counseling or coordinating care with the patient regarding goals of hospitalization, psycho-education, and discharge planning needs.  On my assessment the patient denied SI, HI, AVH, paranoia, ideas of reference, or first rank symptoms on day of discharge. Patient denied drug cravings or active signs of withdrawal. Patient  denied medication side-effects. Patient was not deemed to be a danger to self or others on day of discharge and was in agreement with discharge plans.   I personally was present and performed or re-performed the history, physical exam and medical  decision-making activities of this service and have verified that the service and findings are accurately documented in the student's note, , as addended by me or notated below:  I directly edited the dc summary, as above.   Phineas Inches, MD Psychiatrist    Phineas Inches, MD Psychiatrist

## 2021-12-22 NOTE — Plan of Care (Signed)
Verbal Consent 

## 2021-12-22 NOTE — [facility] Suicide Risk Assessment (Signed)
[facility] Admission Suicide Risk Assessment   Nursing information obtained from:  Patient Demographic factors:  Living alone, Caucasian Current Mental Status:  Suicidal ideation indicated by patient Loss Factors:  Legal issues, Financial problems / change in socioeconomic status, Loss of significant relationship Historical Factors:  NA Risk Reduction Factors:  Employed  Total Time spent with patient: 30 minutes Principal Problem: MDD (major depressive disorder), recurrent, severe, with psychosis (HCC) Diagnosis:  Principal Problem:   MDD (major depressive disorder), recurrent, severe, with psychosis (HCC) Active Problems:   History of seizure   GAD (generalized anxiety disorder)   ADHD (attention deficit hyperactivity disorder), combined type   Cannabis use, uncomplicated  Subjective Data:   Melinda Mills is a 31 year-old woman with a reported past psychiatric history of MDD, anxiety, and ADHD, who was brought to the ED by a concerned friend after the friend was informed that the patient had prepared suicide notes for her family to find the next day. She was placed under involuntary commitment status due to suicidal ideation with intention and plan to overdose on pills.   Prior to admission psychiatric medications: Patient describes cessation of all psychiatric medications in June of this year, then restarting lexapro, buspar and guanfacine about 1 month ago. Of note, she is a [employer] manager and does not take ADHD medication (Mydayis) during summer months.  However, prior to cessation of medications in June her regimen included: -Buspar 10 mg BID (taken as once daily) -Lexapro 20 mg daily -Guanfacine 1 mg daily -Mydayis 37.5 mg daily     Per patient, her psychiatric diagnoses were adequately controlled with her historical regimen (as below) until three months ago when a series of significant stressors appeared in her life. The patient began the process of separation from her  husband approximately three months prior to admission after she impulsively "made out" with two other partners (denies sexual contact). Approximately two months prior to admission, an important ex-boyfriend returned to town after 8 years away, and dealing with the dynamic of seeing her ex-boyfriend and separating from her husband has been a significant stressor. Over these months, the patient describes increasing struggles with hyperactivity, restlessness, difficulty relaxing, being too talkative, acting without thinking, impatience, interrupting others, spending too much, impulsive intense relationships, not being able to postpone gratification, sensation seeking and risk-taking behaviors, mood lability, low frustration tolerance, emotional impulsivity, irritability, and anger outburst.  Patient reports that suicidal thoughts were impulsive, and that she researched which pills would be more lethal, adequate quantity, and estimated the amount of pills she would need to achieve lethality.   Psychiatric review of systems: Patient reports that her mood has been dysphoric but denies pervasive sadness and anhedonia for the weeks leading up to this admission.  She does report poor sleep, about 4 hours per sleep per night.  Reports that energy is up and down.  Reports that appetite is normal (of note, patient reports low appetite when she is on her stimulant medication).  Reports difficulty with concentration, that is chronic.  At this time she denies having any suicidal thoughts and homicidal thoughts.  Denies any psychotic symptoms.  Reports anxiety is excessive, generalized, chronic, elevated.  Reports having panic attacks about once per week.  In regards to symptoms of hypomanic or manic episodes, at this time she does not meet criteria for having a hypomanic or manic episode in the past, but she does describe symptoms such as periods of time of elevated mood lasting for about 3 days,  when she has more impulsivity,  spending more money, is more talkative.  We discussed at length, that this is more likely due to untreated ADHD hyperactivity and impulsive symptoms. She reports a history of traumatic neglect.   Past psychiatric history: Patient describes a historical diagnosis of ADHD from childhood which was re-evaluated and confirmed in 2020. Additionally, she was diagnosed with MDD during her first year of undergraduate (2012). She also describes significant anxiety and panic attacks which began shortly after the birth of her son (2019). She was treated for MDD with Zoloft in college but stopped at graduation due to inconvenience/lack of efficacy. However, in 2020 she began treatment with Lexapro for depression and anxiety with Buspar added to her regimen in 2021. Her ADHD was treated with methylphenidate in childhood. After ADHD re-diagnosis in 2020 she failed trials of Adderall ER and methylphenidate and has been maintained on Mydayis 37.5 mg daily. Two weeks prior to admission, she began a trial of guanfacine 1 mg daily for additional inadequately controlled ADHD symptoms such as impulsivity and agitation. She denies previous psychiatric hospitalization.  Patient reports that 1 time in college she drank excessive alcohol and intent to harm herself and overdose.  She denies other suicide attempts in the past.     Past medical history: Patient describes historical diagnosis of a "seizure disorder" for which she was prescribed Keppra (from approximately 2016-2017). Seizure activity is associated with heightened emotional states and patient can feel the coming on. Per patient, diagnosis was confirmed on EEG.  Patient reports having 2 seizure-like episodes in the past 2 months, coinciding with stress and emotions, with similar symptoms to those that occurred in 2016/2017.  Not currently on Keppra. Additionally, patient has a known family history of Huntington's. 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 Surgical history: Appendectomy NKDA     Social history: Separating from husband.   1 son but is 4.  Employed.   Substance use history: Rare tobacco use, once per month to once per week.  Not regular.  Smokes marijuana in the evenings.  Reports binge type alcohol use, but has behavioral outburst during this time, and is also negatively affected her life.  Rule out alcohol use disorder.   Continued Clinical Symptoms:  Alcohol Use Disorder Identification Test Final Score (AUDIT): 1 The "Alcohol Use Disorders Identification Test", Guidelines for Use in Primary Care, Second Edition.  World Science writer [facility]). Score between 0-7:  no or low risk or alcohol related problems. Score between 8-15:  moderate risk of alcohol related problems. Score between 16-19:  high risk of alcohol related problems. Score 20 or above:  warrants further diagnostic evaluation for alcohol dependence and treatment.   CLINICAL FACTORS:   Severe Anxiety and/or Agitation Depression:   Impulsivity Alcohol/Substance Abuse/Dependencies More than one psychiatric diagnosis Unstable or Poor Therapeutic Relationship Previous Psychiatric Diagnoses and Treatments Medical Diagnoses and Treatments/Surgeries       Psychiatric Specialty Exam:   Presentation  General Appearance: Casual   Eye Contact:Good   Speech:Normal Rate   Speech Volume:Normal   Handedness:Right     Mood and Affect  Mood:Anxious; Dysphoric   Affect:Appropriate; Congruent; Full Range     Thought Process  Thought Processes:Linear   Duration of Psychotic Symptoms: No data recorded Past Diagnosis of Schizophrenia or Psychoactive disorder: No   Descriptions of Associations:Intact   Orientation:Full (Time, Place and Person)  Thought Content:Logical   Hallucinations:Hallucinations: None   Ideas of Reference:None   Suicidal  Thoughts:Suicidal Thoughts: No   Homicidal Thoughts:Homicidal Thoughts: No     Sensorium  Memory:Immediate Good; Recent Good; Remote Good   Judgment:Impaired   Insight:Lacking     Executive Functions  Concentration:Fair   Attention Span:Fair   Recall:Good   Fund of Knowledge:Good   Language:Good     Psychomotor Activity  Psychomotor Activity:Psychomotor Activity: Normal   Assets  Assets:Communication Skills; Desire for Improvement; Leisure Time; Physical Health; Social Support; Resilience     Sleep  Sleep:Sleep: Poor  Physical Exam: Physical Exam See H&P  ROS See H&P  Blood pressure 93/65, pulse 83, temperature 98.1 F (36.7 C), temperature source Oral, resp. rate 12, height 5\' 3"  (1.6 m), weight 78 kg, SpO2 100 %. Body mass index is 30.47 kg/m.   COGNITIVE FEATURES THAT CONTRIBUTE TO RISK:  None    SUICIDE RISK:   Moderate:  Frequent suicidal ideation with limited intensity, and duration, some specificity in terms of plans, no associated intent, good self-control, limited dysphoria/symptomatology, some risk factors present, and identifiable protective factors, including available and accessible social support.  PLAN OF CARE:   See H&P for assessment, diagnosis list, and plan.    I certify that inpatient services furnished can reasonably be expected to improve the patient's condition.   , MD 12/22/2021, 10:20 AM

## 2021-12-23 NOTE — Telephone Encounter (Signed)
Patient was given refills at her hospital discharge today.

## 2022-01-19 ENCOUNTER — Other Ambulatory Visit: Payer: Self-pay | Admitting: Psychiatry

## 2022-01-19 DIAGNOSIS — F3342 Major depressive disorder, recurrent, in full remission: Secondary | ICD-10-CM

## 2022-01-19 DIAGNOSIS — F411 Generalized anxiety disorder: Secondary | ICD-10-CM

## 2022-01-20 ENCOUNTER — Ambulatory Visit (INDEPENDENT_AMBULATORY_CARE_PROVIDER_SITE_OTHER): Payer: BC Managed Care – PPO | Admitting: Clinical

## 2022-01-20 DIAGNOSIS — F411 Generalized anxiety disorder: Secondary | ICD-10-CM | POA: Diagnosis not present

## 2022-01-20 DIAGNOSIS — F331 Major depressive disorder, recurrent, moderate: Secondary | ICD-10-CM | POA: Diagnosis not present

## 2022-01-20 NOTE — Progress Notes (Signed)
IN PERSON  I connected with Melinda Mills on 01/20/22 at  4:00 PM EDT in person and verified that I am speaking with the correct person using two identifiers.  Location: Patient: Office Provider: Office   I discussed the limitations of evaluation and management by telemedicine and the availability of in person appointments. The patient expressed understanding and agreed to proceed.  THERAPIST PROGRESS NOTE     Session Time: 4:00 PM-4:55 PM   Participation Level: Active   Behavioral Response: Casual and Alert,Depressed   Type of Therapy: Individual Therapy   Treatment Goals addressed: Depression and Anxiety   Interventions: CBT   Summary: Melinda Mills a 31 y.o. female who presents with  MDD/ GAD . The OPT therapist worked with the patient for her OPT treatment. The OPT therapist utilized Motivational Interviewing to assist in creating therapeutic repore. The patient in the session was engaged and work in collaboration giving feedback about her triggers and symptoms over the past few weeks. The patient spoke about the events leading up to her hospitalization. The patient  spoke about triangulation between her involvement with her ex-husband and boyfriend. The OPT therapist utilized Cognitive Behavioral Therapy through cognitive restructuring as well as worked with the patient on coping strategies to assist in management of mood and anxiety. The OPT therapist overviewed in session with the patient basic need areas examining the patients current eating habits, sleep schedule, exercise, and hygiene. The patient spoke about her medication therapy and was encouraged to be consistent by her OPT therapist.   Suicidal/Homicidal: Nowithout intent/plan   Therapist Response:The OPT therapist worked with the patient for the patients scheduled session. The patient was engaged in her session and gave feedback in relation to triggers, symptoms, and behavior responses over the past few weeks. The OPT  therapist worked with the patient utilizing an in session Cognitive Behavioral Therapy exercise. The patient was responsive in the session and verbalized, " I realize I cant get back with my husband and keep my boyfriend I cant have them both". The OPT therapist worked with the patient providing ongoing psycho-education. The OPT therapist worked with the patient in the session overviewing her interactions with family. The OPT therapist worked with the patient on non-medicine changes she can try to help with her MH symptoms.. The OPT therapist will work with the patient in her next scheduled session.     Plan: return in 2/3 weeks    Diagnosis:      Axis I: MDD/ GAD   Axis II: No diagnosis       Collaboration of Care: No additional collaboration for this session.   Patient/Guardian was advised Release of Information must be obtained prior to any record release in order to collaborate their care with an outside provider. Patient/Guardian was advised if they have not already done so to contact the registration department to sign all necessary forms in order for [place] to release information regarding their care.    Consent: Patient/Guardian gives verbal consent for treatment and assignment of benefits for services provided during this visit. Patient/Guardian expressed understanding and agreed to proceed.    I discussed the assessment and treatment plan with the patient. The patient was provided an opportunity to ask questions and all were answered. The patient agreed with the plan and demonstrated an understanding of the instructions.   The patient was advised to call back or seek an in-person evaluation if the symptoms worsen or if the condition fails to improve as anticipated.  I provided 55 minutes of face-to-face time during this encounter.   [name], LCSW   01/20/2022

## 2022-01-22 ENCOUNTER — Encounter: Payer: Self-pay | Admitting: Psychiatry

## 2022-01-22 ENCOUNTER — Telehealth (INDEPENDENT_AMBULATORY_CARE_PROVIDER_SITE_OTHER): Payer: BC Managed Care – PPO | Admitting: Psychiatry

## 2022-01-22 DIAGNOSIS — F331 Major depressive disorder, recurrent, moderate: Secondary | ICD-10-CM | POA: Insufficient documentation

## 2022-01-22 DIAGNOSIS — F411 Generalized anxiety disorder: Secondary | ICD-10-CM

## 2022-01-22 DIAGNOSIS — G4701 Insomnia due to medical condition: Secondary | ICD-10-CM

## 2022-01-22 DIAGNOSIS — R569 Unspecified convulsions: Secondary | ICD-10-CM

## 2022-01-22 DIAGNOSIS — F902 Attention-deficit hyperactivity disorder, combined type: Secondary | ICD-10-CM | POA: Diagnosis not present

## 2022-01-22 DIAGNOSIS — F1290 Cannabis use, unspecified, uncomplicated: Secondary | ICD-10-CM

## 2022-01-22 DIAGNOSIS — F3341 Major depressive disorder, recurrent, in partial remission: Secondary | ICD-10-CM | POA: Diagnosis not present

## 2022-01-22 MED ORDER — ARIPIPRAZOLE 5 MG PO TABS: 5.0000 mg | ORAL_TABLET | Freq: Every day | ORAL | 1 refills | Status: DC

## 2022-01-22 MED ORDER — TRAZODONE HCL 50 MG PO TABS: 50.0000 mg | ORAL_TABLET | Freq: Every evening | ORAL | 1 refills | Status: DC | PRN

## 2022-01-22 MED ORDER — BUSPIRONE HCL 10 MG PO TABS: 10.0000 mg | ORAL_TABLET | Freq: Two times a day (BID) | ORAL | 1 refills | Status: DC

## 2022-01-22 NOTE — Progress Notes (Signed)
Virtual Visit via Video Note  I connected with Melinda Mills on 01/22/22 at 11:20 AM EDT by a video enabled telemedicine application and verified that I am speaking with the correct person using two identifiers.  Location Provider Location : [facility] Patient Location : Work  Participants: Patient , Provider    I discussed the limitations of evaluation and management by telemedicine and the availability of in person appointments. The patient expressed understanding and agreed to proceed.    I discussed the assessment and treatment plan with the patient. The patient was provided an opportunity to ask questions and all were answered. The patient agreed with the plan and demonstrated an understanding of the instructions.   The patient was advised to call back or seek an in-person evaluation if the symptoms worsen or if the condition fails to improve as anticipated.   BH MD OP Progress Note  01/22/2022 12:49 PM Melinda Mills  MRN:  [id number]  Chief Complaint:  Chief Complaint  Patient presents with   Follow-up: 31 year old Caucasian female with history of depression, anxiety, recent inpatient behavioral health admission after suicidality, presented for medication management.   HPI: Melinda Mills is a 31 year old Caucasian female, separated, lives in [place], has a history of MDD, GAD, insomnia, ADHD was evaluated by telemedicine today.  Patient was admitted to behavioral health Hospital in [place], 12/15/2021 - 12/22/2021-I have reviewed notes per Dr. Sherron Flemings.  Patient was stabilized and discharged on Abilify, trazodone, guaifenesin, Mydayis, Lexapro, BuSpar.  Patient also was started on Keppra 500 mg twice a day for seizure disorder.  Patient today returns reporting that mood wise she has been feeling better.  She does not feel as depressed or anxious as she used to be.  She continues to struggle with sleep although since school has started back she has been sleeping better.  She  believes the trazodone does help when she takes it.  She has to be more compliant with it.  Patient continues to have psychosocial stressors of relationship struggles with her spouse.  Currently separated.  She reports she is currently in a relationship with someone, going well.  Reports work is going well.  It is good to be back at school.  Patient is currently in psychotherapy with Mr. Suzan Garibaldi, motivated to stay in therapy.  Currently denies any suicidality, homicidality or perceptual disturbances.  Patient does report that she does have a history of seizures, may have had it while she was in college.  She reports a month ago she had seizure-like spells when she had some shaking and she passed out and does not remember anything that happened.  She reports when she gets like that she also has speech difficulty.  She reports she hence was started back on Keppra while she was admitted to the hospital and continues to take it.  Agreeable to follow up with primary care provider and also agreeable to referral to neurology.  Does not remember workups that she has had in the past however may have had an EEG in the past.  Patient continues to be on Mydayis for ADHD symptoms.  Prescribed by [place] attention specialist.  Currently on a 25 mg dosage.  Patient reports she stopped taking the guanfacine since she had a rash when she took it.  Patient does report using cannabis at night to help her to sleep.  Patient denies any other concerns today.  Visit Diagnosis:    ICD-10-CM   1. MDD (major depressive disorder), recurrent, in partial remission (HCC)  F33.41 ARIPiprazole (ABILIFY) 5 MG tablet    traZODone (DESYREL) 50 MG tablet    busPIRone (BUSPAR) 10 MG tablet    2. GAD (generalized anxiety disorder)  F41.1 traZODone (DESYREL) 50 MG tablet    busPIRone (BUSPAR) 10 MG tablet    3. Insomnia due to medical condition  G47.01    mood, RLS    4. ADHD (attention deficit hyperactivity disorder),  combined type  F90.2     5. Seizures (HCC)  R56.9    per history    6. Use of cannabis  F12.90    Rule out cannabis use disorder      Past Psychiatric History: Reviewed past psychiatric history from progress note on 08/15/2018.  Past trials of Zoloft, Lexapro, BuSpar.  Past Medical History:  Past Medical History:  Diagnosis Date   ADHD (attention deficit hyperactivity disorder)    Depression    Seizures (HCC)     Past Surgical History:  Procedure Laterality Date   APPENDECTOMY  2008/2009    Family Psychiatric History: Reviewed family psychiatric history from progress note on 08/15/2018.  Family History:  Family History  Adopted: Yes    Social History: Reviewed social history from progress note on 08/15/2018. Social History   Socioeconomic History   Marital status: Legally Separated    Spouse name: Not on file   Number of children: 1   Years of education: Not on file   Highest education level: Bachelor's degree (e.g., BA, AB, BS)  Occupational History   Not on file  Tobacco Use   Smoking status: Never   Smokeless tobacco: Never  Vaping Use   Vaping Use: Never used  Substance and Sexual Activity   Alcohol use: Not Currently   Drug use: Not Currently    Types: Marijuana   Sexual activity: Yes  Other Topics Concern   Not on file  Social History Narrative   Not on file   Social Determinants of Health   Financial Resource Strain: Low Risk  (08/15/2018)   Overall Financial Resource Strain (CARDIA)    Difficulty of Paying Living Expenses: Not very hard  Food Insecurity: No Food Insecurity (02/09/2018)   Hunger Vital Sign    Worried About Running Out of Food in the Last Year: Never true    Ran Out of Food in the Last Year: Never true  Transportation Needs: No Transportation Needs (02/09/2018)   PRAPARE - Administrator, Civil Service (Medical): No    Lack of Transportation (Non-Medical): No  Physical Activity: Inactive (02/09/2018)   Exercise Vital  Sign    Days of Exercise per Week: 0 days    Minutes of Exercise per Session: 0 min  Stress: No Stress Concern Present (02/09/2018)   [employer] of Occupational Health - Occupational Stress Questionnaire    Feeling of Stress : Not at all  Social Connections: Unknown (08/15/2018)   Social Connection and Isolation Panel [NHANES]    Frequency of Communication with Friends and Family: Not on file    Frequency of Social Gatherings with Friends and Family: Not on file    Attends Religious Services: Not on file    Active Member of Clubs or Organizations: Not on file    Attends Banker Meetings: Not on file    Marital Status: Living with partner    Allergies:  Allergies  Allergen Reactions   Guanfacine Rash    Metabolic Disorder Labs: Lab Results  Component Value Date   HGBA1C 4.8 12/16/2021  MPG 91.06 12/16/2021   No results found for: "PROLACTIN" Lab Results  Component Value Date   CHOL 198 12/16/2021   TRIG 61 12/16/2021   HDL 63 12/16/2021   CHOLHDL 3.1 12/16/2021   VLDL 12 12/16/2021   LDLCALC 123 (H) 12/16/2021   Lab Results  Component Value Date   TSH 3.132 12/16/2021   TSH 1.520 04/03/2021    Therapeutic Level Labs: No results found for: "LITHIUM" No results found for: "VALPROATE" No results found for: "CBMZ"  Current Medications: Current Outpatient Medications  Medication Sig Dispense Refill   ARIPiprazole (ABILIFY) 5 MG tablet Take 1 tablet (5 mg total) by mouth daily. 30 tablet 1   escitalopram (LEXAPRO) 20 MG tablet TAKE 1 TABLET BY MOUTH EVERY DAY 90 tablet 0   hydrOXYzine (ATARAX) 25 MG tablet TAKE 1/2-1 TABLETS (12.5-25 MG TOTAL) BY MOUTH 2 (TWO) TIMES DAILY AS NEEDED. 30 tablet 0   levETIRAcetam (KEPPRA) 500 MG tablet Take 1 tablet (500 mg total) by mouth 2 (two) times daily. 60 tablet 0   MYDAYIS 25 MG CP24 Take 1 capsule by mouth daily.     busPIRone (BUSPAR) 10 MG tablet Take 1 tablet (10 mg total) by mouth 2 (two) times daily. 60  tablet 1   MYDAYIS 37.5 MG CP24 Take 1 capsule by mouth daily. Take 25 mg once daily dose at discharge. Pt already has a prescription. (Patient not taking: Reported on 01/22/2022) 30 capsule 0   traZODone (DESYREL) 50 MG tablet Take 1 tablet (50 mg total) by mouth at bedtime as needed for sleep. 30 tablet 1   No current facility-administered medications for this visit.     Musculoskeletal: Strength & Muscle Tone:  UTA Gait & Station:  Seated Patient leans: N/A  Psychiatric Specialty Exam: Review of Systems  Neurological:  Positive for seizures.  Psychiatric/Behavioral:  Positive for sleep disturbance. The patient is nervous/anxious.   All other systems reviewed and are negative.   There were no vitals taken for this visit.There is no height or weight on file to calculate BMI.  General Appearance: Casual  Eye Contact:  Fair  Speech:  Clear and Coherent  Volume:  Normal  Mood:  Anxious coping well  Affect:  Congruent  Thought Process:  Goal Directed and Descriptions of Associations: Intact  Orientation:  Full (Time, Place, and Person)  Thought Content: Logical   Suicidal Thoughts:  No  Homicidal Thoughts:  No  Memory:  Immediate;   Fair Recent;   Fair Remote;   Fair  Judgement:  Fair  Insight:  Fair  Psychomotor Activity:  Normal  Concentration:  Concentration: Fair and Attention Span: Fair  Recall:  [employer] of Knowledge: Fair  Language: Fair  Akathisia:  No  Handed:  Right  AIMS (if indicated): done  Assets:  Communication Skills Desire for Improvement Housing Social Support  ADL's:  Intact  Cognition: WNL  Sleep:   restless   Screenings: AIMS    Flowsheet Row Video Visit from [facility]/22/2022 in [facility]  AIMS Total Score 0 0      AUDIT    Flowsheet Row Admission (Discharged[facility]   Alcohol Use Disorder Identification Test Final Score (AUDIT) 1      GAD-7    Flowsheet Row Counselor from [facility]2021 [facility] Video Visit from 09/10/2020 in [facility]  Total GAD-7 Score 11 5      PHQ2-9    Flowsheet Row Video Visit from 01/22/2022 in [facility] Counselor from 12/22/2021 [facility] Office Visit from 11/03/2021 in [facility] Video Visit from 07/21/2021 in [facility] Counselor from 06/22/2021 in [facility]  PHQ-2 Total Score 1 2 2  0 0  PHQ-9 Total Score 4 12 12  -- --      Flowsheet Row Video Visit from 01/22/2022 in [facility] Admission (Discharged) [facility] ED from 12/14/2021 in [facility]  C-SSRS RISK CATEGORY High Risk High Risk High Risk        Assessment and Plan: Melinda Mills is a 31 year old Caucasian female, separated, employed, lives in [place], has a history of MDD, GAD, history of seizure disorder was evaluated by telemedicine today.  Patient with recent inpatient behavioral health admission, currently improving, will benefit from the following plan.  Plan MDD in partial remission BuSpar 10 mg p.o. twice daily Lexapro 20 mg p.o. daily Abilify 5 mg p.o. daily in the morning Trazodone 50 mg at bedtime as needed.  GAD-improving BuSpar 10 mg p.o. twice daily Lexapro 20 mg p.o. daily Hydroxyzine 25 mg twice a day as needed for anxiety.  However has been limiting use.   Insomnia-improving Patient to make use of trazodone 50 mg at bedtime as needed Advised to work on sleep hygiene.  ADHD-improving Patient follows up with [facility] attention specialist. She is on  psychostimulant-mydayis-discussed the risk of being on medications like this when she has a history of seizure given the fact that she had recent seizure-like spells.  Seizure-like spells-unstable History of seizure disorder.  Will refer to neurology. Patient to contact primary care provider for management of Keppra which was recently prescribed. Patient advised, provided education about all her psychotropic medications, could lower her seizure threshold.  Use of cannabis-rule out cannabis use disorder-provided counseling.  Discussed interaction with medications as well as the effect on her mood, cognition.  I have reviewed notes per Dr. 37 12/15/2021 - 12/22/2021 as noted above.  Follow-up in clinic in 4 to 6 weeks or sooner if needed.   Collaboration of Care: Collaboration of Care: Referral or follow-up with counselor/therapist AEB encouraged to follow up with neurology, primary care provider as well as a therapist.  Patient/Guardian was advised Release of Information must be obtained prior to any record release in order to collaborate their care with an outside provider. Patient/Guardian was advised if they have not already done so to contact the registration department to sign all necessary forms in order for 12/17/2021 to release information regarding their care.   Consent: Patient/Guardian gives verbal consent for treatment and assignment of benefits for services provided during this visit. Patient/Guardian expressed understanding and agreed to proceed.   This note was generated in part or whole with voice recognition software. Voice recognition is usually quite accurate but there are transcription errors that can and very often do occur. I apologize for any typographical errors that were not detected and corrected.      02/21/2022, MD 01/22/2022, 12:49 PM

## 2022-01-27 ENCOUNTER — Ambulatory Visit: Payer: BC Managed Care – PPO | Admitting: Psychiatry

## 2022-02-08 ENCOUNTER — Telehealth: Payer: Self-pay | Admitting: Psychiatry

## 2022-02-08 DIAGNOSIS — F3341 Major depressive disorder, recurrent, in partial remission: Secondary | ICD-10-CM

## 2022-02-08 DIAGNOSIS — F411 Generalized anxiety disorder: Secondary | ICD-10-CM

## 2022-02-08 DIAGNOSIS — Z9189 Other specified personal risk factors, not elsewhere classified: Secondary | ICD-10-CM

## 2022-02-08 NOTE — Telephone Encounter (Signed)
Return call to the patient-patient is not suicidal, denies any homicidality or perceptual disturbances.  Reports depression continues to be a problem and she does not feel the Abilify 5 mg is helpful.  Will get EKG done prior to increasing the Abilify dosage.  Provided phone #[phone], EKG order placed.  Patient also has not received a call from neurology at-patient with history of seizure disorder needs neurology consultation.

## 2022-02-08 NOTE — Telephone Encounter (Signed)
Patient has left a message 02-07-22 regarding increasing her Abilify 5mg  to 10 mg. States the 5 mg is not working. Please advise

## 2022-02-08 NOTE — Telephone Encounter (Signed)
Referral was sent.

## 2022-02-10 ENCOUNTER — Ambulatory Visit
Admission: RE | Admit: 2022-02-10 | Discharge: 2022-02-10 | Disposition: A | Discharge status: Home / Self Care | Payer: BC Managed Care – PPO | Source: Ambulatory Visit | Attending: Psychiatry | Admitting: Psychiatry

## 2022-02-10 DIAGNOSIS — F3341 Major depressive disorder, recurrent, in partial remission: Secondary | ICD-10-CM | POA: Insufficient documentation

## 2022-02-10 DIAGNOSIS — Z9189 Other specified personal risk factors, not elsewhere classified: Secondary | ICD-10-CM | POA: Insufficient documentation

## 2022-02-11 ENCOUNTER — Telehealth: Payer: Self-pay | Admitting: Psychiatry

## 2022-02-11 DIAGNOSIS — F3341 Major depressive disorder, recurrent, in partial remission: Secondary | ICD-10-CM

## 2022-02-11 MED ORDER — ARIPIPRAZOLE 10 MG PO TABS: 10.0000 mg | ORAL_TABLET | Freq: Every day | ORAL | 0 refills | Status: DC

## 2022-02-11 NOTE — Telephone Encounter (Signed)
notified patient of changes in mediation and that it was sent to the pharmacy

## 2022-02-11 NOTE — Telephone Encounter (Signed)
I have reviewed EKG-dated 02/10/2022.  Okay to increase Abilify.  Will increase Abilify to 10 mg and I have sent it to [facility] [address]

## 2022-02-19 ENCOUNTER — Other Ambulatory Visit: Payer: Self-pay | Admitting: Psychiatry

## 2022-02-19 DIAGNOSIS — F411 Generalized anxiety disorder: Secondary | ICD-10-CM

## 2022-02-19 DIAGNOSIS — F3341 Major depressive disorder, recurrent, in partial remission: Secondary | ICD-10-CM

## 2022-02-22 ENCOUNTER — Encounter: Payer: Self-pay | Admitting: Psychiatry

## 2022-02-22 ENCOUNTER — Ambulatory Visit: Payer: BC Managed Care – PPO | Admitting: Psychiatry

## 2022-02-22 VITALS — BP 118/78 | HR 85 | Temp 98.3°F | Wt 183.6 lb

## 2022-02-22 DIAGNOSIS — G4701 Insomnia due to medical condition: Secondary | ICD-10-CM | POA: Diagnosis not present

## 2022-02-22 DIAGNOSIS — F411 Generalized anxiety disorder: Secondary | ICD-10-CM | POA: Diagnosis not present

## 2022-02-22 DIAGNOSIS — F3341 Major depressive disorder, recurrent, in partial remission: Secondary | ICD-10-CM | POA: Diagnosis not present

## 2022-02-22 DIAGNOSIS — F902 Attention-deficit hyperactivity disorder, combined type: Secondary | ICD-10-CM

## 2022-02-22 DIAGNOSIS — R569 Unspecified convulsions: Secondary | ICD-10-CM

## 2022-02-22 DIAGNOSIS — F1290 Cannabis use, unspecified, uncomplicated: Secondary | ICD-10-CM

## 2022-02-22 MED ORDER — ARIPIPRAZOLE 10 MG PO TABS: 10.0000 mg | ORAL_TABLET | Freq: Every day | ORAL | 2 refills | Status: DC

## 2022-02-22 NOTE — Progress Notes (Signed)
[facility] MD OP Progress Note  02/22/2022 3:58 PM Melinda Mills  MRN:  [id number]  Chief Complaint:  Chief Complaint  Patient presents with   Follow-up   Depression   Anxiety   HPI: Melinda Mills is a 31 year old Caucasian female, separated, lives in [place], has a history of MDD, GAD, insomnia, ADHD was evaluated in office today.  Patient today reports she has been improving with regards to her mood.  She reports however few days ago she had an episode when she felt overwhelmed and felt she could not cope with the stress.  Patient however denies any suicidality.  Patient reports although she is anxious she has been coping okay.  Reports she does have multiple psychosocial stressors including going through divorce, work-related stresses.  She has started psychotherapy sessions with Mr. Maye Hides, reports therapy sessions are beneficial.  Patient is compliant on the medications as prescribed.  Reports she increased the dosage of Abilify just 3 days ago.  Tolerating it well so far.  Does have some jaw clenching however reports she had it even before the Abilify and usually does it when she is anxious or hyperfocused.  Does not believe it is due to the Abilify.  She does report weight gain likely multifactorial.  She has not been exercising and needs to make some adjustment with her diet since she is not following a good diet at this time.  Reports she does crave  junk food which is a problem.  Reports sleep is good.  Uses the trazodone as needed.  Patient denies any suicidality, homicidality or perceptual disturbances.  Patient denies any other concerns today. Visit Diagnosis:    ICD-10-CM   1. MDD (major depressive disorder), recurrent, in partial remission (HCC)  F33.41 ARIPiprazole (ABILIFY) 10 MG tablet    2. GAD (generalized anxiety disorder)  F41.1     3. Insomnia due to medical condition  G47.01    mood, RLS    4. ADHD (attention deficit hyperactivity disorder), combined type   F90.2     5. Use of cannabis  F12.90     6. Seizures ([place])  R56.9    per history      Past Psychiatric History: Reviewed past psychiatric history from progress note on 08/15/2018.  Past trials of Zoloft, Lexapro, BuSpar.  Past Medical History:  Past Medical History:  Diagnosis Date   ADHD (attention deficit hyperactivity disorder)    Depression    Seizures ([place])     Past Surgical History:  Procedure Laterality Date   APPENDECTOMY  2008/2009    Family Psychiatric History: Reviewed family psychiatric history from progress note on 08/15/2018.  Family History:  Family History  Adopted: Yes    Social History: Reviewed social history from progress note on 08/15/2018. Social History   Socioeconomic History   Marital status: Legally Separated    Spouse name: Not on file   Number of children: 1   Years of education: Not on file   Highest education level: Bachelor's degree (e.g., BA, AB, BS)  Occupational History   Not on file  Tobacco Use   Smoking status: Never   Smokeless tobacco: Never  Vaping Use   Vaping Use: Never used  Substance and Sexual Activity   Alcohol use: Not Currently   Drug use: Not Currently    Types: Marijuana   Sexual activity: Yes  Other Topics Concern   Not on file  Social History Narrative   Not on file   Social Determinants of Health  Financial Resource Strain: Low Risk  (08/15/2018)   Overall Financial Resource Strain (CARDIA)    Difficulty of Paying Living Expenses: Not very hard  Food Insecurity: No Food Insecurity (02/09/2018)   Hunger Vital Sign    Worried About Running Out of Food in the Last Year: Never true    Ran Out of Food in the Last Year: Never true  Transportation Needs: No Transportation Needs (02/09/2018)   PRAPARE - Administrator, Civil Service (Medical): No    Lack of Transportation (Non-Medical): No  Physical Activity: Inactive (02/09/2018)   Exercise Vital Sign    Days of Exercise per Week: 0 days     Minutes of Exercise per Session: 0 min  Stress: No Stress Concern Present (02/09/2018)   [employer] of Occupational Health - Occupational Stress Questionnaire    Feeling of Stress : Not at all  Social Connections: Unknown (08/15/2018)   Social Connection and Isolation Panel [NHANES]    Frequency of Communication with Friends and Family: Not on file    Frequency of Social Gatherings with Friends and Family: Not on file    Attends Religious Services: Not on file    Active Member of Clubs or Organizations: Not on file    Attends Banker Meetings: Not on file    Marital Status: Living with partner    Allergies:  Allergies  Allergen Reactions   Guanfacine Rash    Metabolic Disorder Labs: Lab Results  Component Value Date   HGBA1C 4.8 12/16/2021   MPG 91.06 12/16/2021   No results found for: "PROLACTIN" Lab Results  Component Value Date   CHOL 198 12/16/2021   TRIG 61 12/16/2021   HDL 63 12/16/2021   CHOLHDL 3.1 12/16/2021   VLDL 12 12/16/2021   LDLCALC 123 (H) 12/16/2021   Lab Results  Component Value Date   TSH 3.132 12/16/2021   TSH 1.520 04/03/2021    Therapeutic Level Labs: No results found for: "LITHIUM" No results found for: "VALPROATE" No results found for: "CBMZ"  Current Medications: Current Outpatient Medications  Medication Sig Dispense Refill   busPIRone (BUSPAR) 10 MG tablet TAKE 1 TABLET BY MOUTH TWICE A DAY 180 tablet 0   escitalopram (LEXAPRO) 20 MG tablet TAKE 1 TABLET BY MOUTH EVERY DAY 90 tablet 0   hydrOXYzine (ATARAX) 25 MG tablet TAKE 1/2-1 TABLETS (12.5-25 MG TOTAL) BY MOUTH 2 (TWO) TIMES DAILY AS NEEDED. 30 tablet 0   levETIRAcetam (KEPPRA) 500 MG tablet Take 1 tablet (500 mg total) by mouth 2 (two) times daily. 60 tablet 0   MYDAYIS 25 MG CP24 Take 1 capsule by mouth daily.     traZODone (DESYREL) 50 MG tablet TAKE 1 TABLET BY MOUTH AT BEDTIME AS NEEDED FOR SLEEP. 90 tablet 0   ARIPiprazole (ABILIFY) 10 MG tablet Take 1  tablet (10 mg total) by mouth daily. 30 tablet 2   No current facility-administered medications for this visit.     Musculoskeletal: Strength & Muscle Tone: within normal limits Gait & Station: normal Patient leans: N/A  Psychiatric Specialty Exam: Review of Systems  Psychiatric/Behavioral:  Positive for dysphoric mood. The patient is nervous/anxious.   All other systems reviewed and are negative.   Blood pressure 118/78, pulse 85, temperature 98.3 F (36.8 C), temperature source Temporal, weight 183 lb 9.6 oz (83.3 kg).Body mass index is 32.52 kg/m.  General Appearance: Casual  Eye Contact:  Fair  Speech:  Clear and Coherent  Volume:  Normal  Mood:  Anxious and Depressed improving  Affect:  Appropriate  Thought Process:  Goal Directed and Descriptions of Associations: Intact  Orientation:  Full (Time, Place, and Person)  Thought Content: Logical   Suicidal Thoughts:  No  Homicidal Thoughts:  No  Memory:  Immediate;   Fair Recent;   Fair Remote;   Fair  Judgement:  Fair  Insight:  Fair  Psychomotor Activity:  Normal  Concentration:  Concentration: Fair and Attention Span: Fair  Recall:  [employer] of Knowledge: Fair  Language: Fair  Akathisia:  No  Handed:  Right  AIMS (if indicated): done  Assets:  Communication Skills Desire for Improvement Housing Social Support  ADL's:  Intact  Cognition: WNL  Sleep:  Fair   Screenings: [place] Office Visit from 02/22/2022 in [facility] Video Visit from 01/22/2022 in [place] Admission (Discharged) from 12/15/2021 in [facility]  AIMS Total Score 0 0 0      AUDIT    Flowsheet Row Admission (Discharged) from 12/15/2021 in [facility]  Alcohol Use Disorder Identification Test Final Score (AUDIT) 1      GAD-7    Flowsheet Row Office Visit from 02/22/2022 in [facility] Counselor from 12/22/2021 in [facility] Video Visit from 09/10/2020 in [facility]  Total GAD-7 Score 7 11 5       PHQ2-9    [place] Visit from 02/22/2022 in [place] Video Visit from 01/22/2022 in [place] from 12/22/2021 in [place] Office Visit from 11/03/2021 in [place] Video Visit from 07/21/2021 in [place]  PHQ-2 Total Score 2 1 2 2  0  PHQ-9 Total Score 12 4 12 12  --      [place] Office Visit from 02/22/2022 in [place] Video Visit from 01/22/2022 in [place] Admission (Discharged) from 12/15/2021 in [place] [facility]  C-SSRS RISK CATEGORY Moderate Risk High Risk High Risk        Assessment and Plan: Aneres Muhl is a 31 year old Caucasian female, separated, employed, lives in [place], has a history of MDD, GAD, history of seizure disorder was evaluated in office today.  Patient is currently improving on the current medication regimen, will benefit from the following plan.  Plan MDD in partial remission BuSpar 10 mg p.o. twice daily Lexapro 20 mg p.o. daily Abilify increased dosage of 10 mg p.o. daily in the morning, dosage increased 3 days ago. Trazodone 50 mg at bedtime as needed   GAD-improving BuSpar 10 mg p.o. twice daily Lexapro 20 mg p.o. daily Hydroxyzine 25 mg p.o. twice daily as needed for anxiety.  Insomnia-stable Continue trazodone 50 mg at bedtime as needed  Seizure-like spells-patient has upcoming appointment with neurology.  Use of cannabis-rule out cannabis use disorder-patient to continue CBT   I have reviewed EKG-dated  Follow-up in clinic in 2 months or sooner if  needed.   This note was generated in part or whole with voice recognition software. Voice recognition is usually quite accurate but there are transcription errors that can and very often do occur. I apologize for any typographical errors that were not detected and corrected.     Ursula Alert, MD 02/23/2022, 3:02 PM

## 2022-02-24 ENCOUNTER — Ambulatory Visit (INDEPENDENT_AMBULATORY_CARE_PROVIDER_SITE_OTHER): Payer: BC Managed Care – PPO | Admitting: Clinical

## 2022-02-24 DIAGNOSIS — F411 Generalized anxiety disorder: Secondary | ICD-10-CM | POA: Diagnosis not present

## 2022-02-24 DIAGNOSIS — F331 Major depressive disorder, recurrent, moderate: Secondary | ICD-10-CM
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
## 2022-03-04 ENCOUNTER — Ambulatory Visit: Payer: BC Managed Care – PPO | Admitting: Neurology

## 2022-03-04 ENCOUNTER — Encounter: Payer: Self-pay | Admitting: Neurology

## 2022-03-04 VITALS — BP 122/84 | HR 80 | Ht 63.0 in | Wt 180.0 lb

## 2022-03-04 DIAGNOSIS — G40909 Epilepsy, unspecified, not intractable, without status epilepticus: Secondary | ICD-10-CM

## 2022-03-04 MED ORDER — LAMOTRIGINE 25 MG PO TABS: ORAL_TABLET | ORAL | 3 refills | Status: DC

## 2022-03-04 NOTE — Progress Notes (Signed)
[facility]  PATIENT: Melinda Mills DOB: 02/15/1991  REQUESTING CLINICIAN: Jomarie Longs, MD HISTORY FROM: Patient  REASON FOR VISIT: Seizure   HISTORICAL  CHIEF COMPLAINT:  Chief Complaint  Patient presents with   New Patient (Initial Visit)    Rm 15 alone here for consult on seizures. Pt was dx with stress related seizures in college. Psychiatrist unable to prescribe seizures meds recommended consult with neurologist on best meds to take going forward.  She is on Keppra 500 bid tolerating well.     HISTORY OF PRESENT ILLNESS:  This is a 31 year old woman past medical history of major depressive disorder, anxiety disorder who is presenting to establish care for seizure.  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  At that time, she was told that her seizures were induced by stress.  She described them as fainting episodes, sometime associated with twitching.  She was told during her seizures, her breathing gets shallow.  She reports prior to these events sometimes she will feel like almost having a panic attack, that she feels lightheaded then she passes out.  She did have a EEG in 2016 which showed epileptogenic potential, therefore she was started on Keppra.  She has been on Keppra for 5 years then discontinued.  She reported back in August she was admitted at  behavioral health and Keppra was restarted.  Since then she report being more anxious and depressed.  She reported her last seizure was 3 days ago and she was very upset on the phone with he friends, then she passed out. Denies any injuries with these seizures.    Handedness: Right   Onset: 2013  Seizure Type: Syncope, sometimes feels lightheaded, feels like she is going to have a panic attack   Current frequency: Last episode 3 days ago, prior to that was a few months ago   Any injuries from seizures: Hit head  Seizure risk factors: None reported    Previous ASMs: Levetiracetam   Currenty ASMs: Levetiracetam 500 mg BID restarted in August   ASMs side effects: Drowsiness  Brain Images: Normal 2016  Previous EEGs: paroxysmal bursts of semi-rhythmic delta activity intermixed with sharp components   OTHER MEDICAL CONDITIONS: Anxiety. Depression   REVIEW OF SYSTEMS: Full 14 system review of systems performed and negative with exception of: As noted in the HPI    ALLERGIES: Allergies  Allergen Reactions   Guanfacine Rash    HOME MEDICATIONS: Outpatient Medications Prior to Visit  Medication Sig Dispense Refill   ARIPiprazole (ABILIFY) 10 MG tablet Take 1 tablet (10 mg total) by mouth daily. 30 tablet 2   busPIRone (BUSPAR) 10 MG tablet TAKE 1 TABLET BY MOUTH TWICE A DAY 180 tablet 0   escitalopram (LEXAPRO) 20 MG tablet TAKE 1 TABLET BY MOUTH EVERY DAY 90 tablet 0   hydrOXYzine (ATARAX) 25 MG tablet TAKE 1/2-1 TABLETS (12.5-25 MG TOTAL) BY MOUTH 2 (TWO) TIMES DAILY AS NEEDED. 30 tablet 0   MYDAYIS 25 MG CP24 Take 1 capsule by mouth daily.     traZODone (DESYREL) 50 MG tablet TAKE 1 TABLET BY MOUTH AT BEDTIME AS NEEDED FOR SLEEP. 90 tablet 0   levETIRAcetam (KEPPRA) 500 MG tablet Take 1 tablet (500 mg total) by mouth 2 (two) times daily. 60 tablet 0   No facility-administered medications prior to visit.    PAST MEDICAL HISTORY: Past Medical History:  Diagnosis Date   ADHD (attention deficit  hyperactivity disorder)    Depression    Seizures ([place])     PAST SURGICAL HISTORY: Past Surgical History:  Procedure Laterality Date   APPENDECTOMY  2008/2009    FAMILY HISTORY: Family History  Adopted: Yes    SOCIAL HISTORY: Social History   Socioeconomic History   Marital status: Legally Separated    Spouse name: Not on file   Number of children: 1   Years of education: Not on file   Highest education level: Bachelor's degree (e.g., BA, AB, BS)  Occupational History   Not on file  Tobacco Use   Smoking status:  Never   Smokeless tobacco: Never  Vaping Use   Vaping Use: Never used  Substance and Sexual Activity   Alcohol use: Not Currently   Drug use: Not Currently    Types: Marijuana   Sexual activity: Yes  Other Topics Concern   Not on file  Social History Narrative   Right handed    Caffeine 3 cups per day    Social Determinants of Health   Financial Resource Strain: Low Risk  (08/15/2018)   Overall Financial Resource Strain (CARDIA)    Difficulty of Paying Living Expenses: Not very hard  Food Insecurity: No Food Insecurity (02/09/2018)   Hunger Vital Sign    Worried About Running Out of Food in the Last Year: Never true    Ran Out of Food in the Last Year: Never true  Transportation Needs: No Transportation Needs (02/09/2018)   PRAPARE - Hydrologist (Medical): No    Lack of Transportation (Non-Medical): No  Physical Activity: Inactive (02/09/2018)   Exercise Vital Sign    Days of Exercise per Week: 0 days    Minutes of Exercise per Session: 0 min  Stress: No Stress Concern Present (02/09/2018)   [place]    Feeling of Stress : Not at all  Social Connections: Unknown (08/15/2018)   Social Connection and Isolation Panel [NHANES]    Frequency of Communication with Friends and Family: Not on file    Frequency of Social Gatherings with Friends and Family: Not on file    Attends Religious Services: Not on file    Active Member of Clubs or Organizations: Not on file    Attends Archivist Meetings: Not on file    Marital Status: Living with partner  Intimate Partner Violence: Not At Risk (08/15/2018)   Humiliation, Afraid, Rape, and Kick questionnaire    Fear of Current or Ex-Partner: No    Emotionally Abused: No    Physically Abused: No    Sexually Abused: No     PHYSICAL EXAM  GENERAL EXAM/CONSTITUTIONAL: Vitals:  Vitals:   03/04/22 0802  BP: 122/84  Pulse: 80  Weight:  180 lb (81.6 kg)  Height: 5\' 3"  (1.6 m)   Body mass index is 31.89 kg/m. Wt Readings from Last 3 Encounters:  03/04/22 180 lb (81.6 kg)  02/22/22 183 lb 9.6 oz (83.3 kg)  12/15/21 172 lb (78 kg)   Patient is in no distress; well developed, nourished and groomed; neck is supple  EYES: Pupils round and reactive to light, Visual fields full to confrontation, Extraocular movements intacts,  No results found.  MUSCULOSKELETAL: Gait, strength, tone, movements noted in Neurologic exam below  NEUROLOGIC: MENTAL STATUS:      No data to display         awake, alert, oriented to person, place and time  recent and remote memory intact normal attention and concentration language fluent, comprehension intact, naming intact fund of knowledge appropriate  CRANIAL NERVE:  2nd, 3rd, 4th, 6th - pupils equal and reactive to light, visual fields full to confrontation, extraocular muscles intact, no nystagmus 5th - facial sensation symmetric 7th - facial strength symmetric 8th - hearing intact 9th - palate elevates symmetrically, uvula midline 11th - shoulder shrug symmetric 12th - tongue protrusion midline  MOTOR:  normal bulk and tone, full strength in the BUE, BLE  SENSORY:  normal and symmetric to light touch  COORDINATION:  finger-nose-finger, fine finger movements normal  REFLEXES:  deep tendon reflexes present and symmetric  GAIT/STATION:  normal     DIAGNOSTIC DATA (LABS, IMAGING, TESTING) - I reviewed patient records, labs, notes, testing and imaging myself where available.  Lab Results  Component Value Date   WBC 8.2 12/16/2021   HGB 14.7 12/16/2021   HCT 44.7 12/16/2021   MCV 90.5 12/16/2021   PLT 225 12/16/2021      Component Value Date/Time   NA 140 12/16/2021 0639   K 4.1 12/16/2021 0639   CL 106 12/16/2021 0639   CO2 26 12/16/2021 0639   GLUCOSE 92 12/16/2021 0639   BUN 11 12/16/2021 0639   CREATININE 0.79 12/16/2021 0639   CALCIUM 9.3  12/16/2021 0639   PROT 7.3 12/16/2021 0639   ALBUMIN 4.1 12/16/2021 0639   AST 18 12/16/2021 0639   ALT 17 12/16/2021 0639   ALKPHOS 70 12/16/2021 0639   BILITOT 0.6 12/16/2021 0639   GFRNONAA >60 12/16/2021 0639   GFRAA >60 02/10/2018 0527   Lab Results  Component Value Date   CHOL 198 12/16/2021   HDL 63 12/16/2021   LDLCALC 123 (H) 12/16/2021   TRIG 61 12/16/2021   Lab Results  Component Value Date   HGBA1C 4.8 12/16/2021   No results found for: "VITAMINB12" Lab Results  Component Value Date   TSH 3.132 12/16/2021    EEG 2016 Abnormal EEG demonstrating a few paroxysmal bursts of semi-rhythmic delta activity intermixed with sharp components.  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  Clinical correlate is required  Brain MRI 2016 Unremarkable MRI of the brain with and without gadolinium enhancement.   ASSESSMENT AND PLAN  [age] y.o. year old female  with anxiety and depression who is presenting with paroxysmal events concerning for seizures.  These events started in 2013, she was told that they are stress induced.  In August she was admitted to the behavioral health and at that time Keppra was restarted.  Since then she has reported increased depression.  I have told patient that Keppra might not be the right medication for her due to neuropsychiatric side effects.  I will plan to switch her to lamotrigine.  We will obtain a routine EEG.  I also advised her to document all of her seizures to see if there is any correlation with the increase stress or patient being upset.  Follow-up in 6 weeks.    1. Seizure disorder [facility](HCC)     Patient Instructions  Continue with Keppra 500 mg BID  Start with Lamotrigine titration as noted below  Week 1: 25mg  (one pill) at night  Week 2: 25mg  (one pill) twice daily Week 3: 50mg  (two pills) twice daily  Week 4: 75mg  (three pills) twice daily Week 5: 100mg  (fours pills) twice daily  Will obtain a  Lamotrigine level (blood test) and complete metabolic panel at week  6  Please stop the medication and call the office as soon as you develop a new rash    Per [facility] statutes, patients with seizures are not allowed to drive until they have been seizure-free for six months.  Other recommendations include using caution when using heavy equipment or power tools. Avoid working on ladders or at heights. Take showers instead of baths.  Do not swim alone.  Ensure the water temperature is not too high on the home water heater. Do not go swimming alone. Do not lock yourself in a room alone (i.e. bathroom). When caring for infants or small children, sit down when holding, feeding, or changing them to minimize risk of injury to the child in the event you have a seizure. Maintain good sleep hygiene. Avoid alcohol.  Also recommend adequate sleep, hydration, good diet and minimize stress.   During the Seizure  - First, ensure adequate ventilation and place patients on the floor on their left side  Loosen clothing around the neck and ensure the airway is patent. If the patient is clenching the teeth, do not force the mouth open with any object as this can cause severe damage - Remove all items from the surrounding that can be hazardous. The patient may be oblivious to what's happening and may not even know what he or she is doing. If the patient is confused and wandering, either gently guide him/her away and block access to outside areas - Reassure the individual and be comforting - Call 911. In most cases, the seizure ends before EMS arrives. However, there are cases when seizures may last over 3 to 5 minutes. Or the individual may have developed breathing difficulties or severe injuries. If a pregnant patient or a person with diabetes develops a seizure, it is prudent to call an ambulance. - Finally, if the patient does not regain full consciousness, then call EMS. Most patients will remain confused  for about 45 to 90 minutes after a seizure, so you must use judgment in calling for help. - Avoid restraints but make sure the patient is in a bed with padded side rails - Place the individual in a lateral position with the neck slightly flexed; this will help the saliva drain from the mouth and prevent the tongue from falling backward - Remove all nearby furniture and other hazards from the area - Provide verbal assurance as the individual is regaining consciousness - Provide the patient with privacy if possible - Call for help and start treatment as ordered by the caregiver   After the Seizure (Postictal Stage)  After a seizure, most patients experience confusion, fatigue, muscle pain and/or a headache. Thus, one should permit the individual to sleep. For the next few days, reassurance is essential. Being calm and helping reorient the person is also of importance.  Most seizures are painless and end spontaneously. Seizures are not harmful to others but can lead to complications such as stress on the lungs, brain and the heart. Individuals with prior lung problems may develop labored breathing and respiratory distress.     Orders Placed This Encounter  Procedures   EEG adult    Meds ordered this encounter  Medications   lamoTRIgine (LAMICTAL) 25 MG tablet    Sig: Week 1: 25mg  (one pill) at night. Week 2: 25mg  (one pill) twice daily. Week 3: 50mg  (two pills) twice daily. Week 4: 75mg  (three pills) twice daily. Week 5: 100 mg (four pills) twice daily.    Dispense:  240 tablet    Refill:  3    Return in about 6 weeks (around 04/15/2022).    Windell Norfolk, MD 03/04/2022, 9:01 AM  [facility] [address], [address] [place], [place] [address] [phone]

## 2022-03-04 NOTE — Patient Instructions (Addendum)
Continue with Keppra 500 mg BID  Start with Lamotrigine titration as noted below  Week 1: 25mg  (one pill) at night  Week 2: 25mg  (one pill) twice daily Week 3: 50mg  (two pills) twice daily  Week 4: 75mg  (three pills) twice daily Week 5: 100mg  (fours pills) twice daily  Will obtain a Lamotrigine level (blood test) and complete metabolic panel at week 6  Please stop the medication and call the office as soon as you develop a new rash

## 2022-03-17 ENCOUNTER — Other Ambulatory Visit: Payer: Self-pay | Admitting: Psychiatry

## 2022-03-17 ENCOUNTER — Ambulatory Visit: Payer: BC Managed Care – PPO | Admitting: Neurology

## 2022-03-17 DIAGNOSIS — G40909 Epilepsy, unspecified, not intractable, without status epilepticus: Secondary | ICD-10-CM | POA: Diagnosis not present

## 2022-03-17 DIAGNOSIS — F3341 Major depressive disorder, recurrent, in partial remission: Secondary | ICD-10-CM

## 2022-03-17 NOTE — Procedures (Signed)
    History:  31 year old woman with seizure disorder  EEG classification:  Awake and asleep  Description of the recording: The background rhythms of this recording consists of a fairly well modulated medium amplitude background activity of 9-10 Hz. As the record progresses, the patient initially is in the waking state, but appears to enter the early stage II sleep during the recording, with rudimentary sleep spindles and vertex sharp wave activity seen. During the wakeful state, photic stimulation is performed, and no abnormal responses were seen. Hyperventilation was also performed, no abnormal response seen. No epileptiform discharges seen during this recording. There was no focal slowing.  Abnormality: None   Impression: This is a normal EEG recording in the waking and sleeping state. No evidence interictal epileptiform discharges were seen at any time during the recording.  A normal EEG does not exclude a diagnosis of epilepsy.    Alric Ran, MD [facility]

## 2022-03-22 ENCOUNTER — Ambulatory Visit (INDEPENDENT_AMBULATORY_CARE_PROVIDER_SITE_OTHER): Payer: BC Managed Care – PPO | Admitting: Clinical

## 2022-03-22 DIAGNOSIS — F411 Generalized anxiety disorder: Secondary | ICD-10-CM

## 2022-03-22 DIAGNOSIS — F331 Major depressive disorder, recurrent, moderate: Secondary | ICD-10-CM | POA: Diagnosis not present
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
## 2022-03-23 ENCOUNTER — Ambulatory Visit: Payer: BC Managed Care – PPO | Admitting: Psychiatry

## 2022-04-06 ENCOUNTER — Ambulatory Visit: Payer: BC Managed Care – PPO | Admitting: Neurology

## 2022-04-06 ENCOUNTER — Encounter: Payer: Self-pay | Admitting: Neurology

## 2022-04-06 VITALS — BP 121/79 | HR 85 | Ht 63.0 in | Wt 180.0 lb

## 2022-04-06 DIAGNOSIS — Z5181 Encounter for therapeutic drug level monitoring: Secondary | ICD-10-CM

## 2022-04-06 DIAGNOSIS — G40909 Epilepsy, unspecified, not intractable, without status epilepticus: Secondary | ICD-10-CM | POA: Diagnosis not present

## 2022-04-06 MED ORDER — LAMOTRIGINE 100 MG PO TABS: 100.0000 mg | ORAL_TABLET | Freq: Every day | ORAL | 3 refills | Status: DC

## 2022-04-06 NOTE — Progress Notes (Signed)
[facility]  PATIENT: Melinda Mills DOB: Sep 25, 1990  REQUESTING CLINICIAN: Ward, Elenora Fenderhelsea C, MD HISTORY FROM: Patient  REASON FOR VISIT: Seizure   HISTORICAL  CHIEF COMPLAINT:  Chief Complaint  Patient presents with   Follow-up    Rm 13. Alone. No new seizure activity.    INTERVAL HISTORY 04/06/2022:  Patient presents today for follow-up, last visit was October 12, at that time plan was to switch the Keppra to lamotrigine because of side effect and she reports switching the medication and increasing the lamotrigine to 100 mg twice daily.  Reports that  her mood is good, she discontinued Keppra, denies any seizure or seizure-like activity.  Overall she is doing very well. No complaints, no concerns.    HISTORY OF PRESENT ILLNESS:  This is a 31 year old woman past medical history of major depressive disorder, anxiety disorder who is presenting to establish care for seizure.  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  At that time, she was told that her seizures were induced by stress.  She described them as fainting episodes, sometime associated with twitching.  She was told during her seizures, her breathing gets shallow.  She reports prior to these events sometimes she will feel like almost having a panic attack, that she feels lightheaded then she passes out.  She did have a EEG in 2016 which showed epileptogenic potential, therefore she was started on Keppra.  She has been on Keppra for 5 years then discontinued.  She reported back in August she was admitted at  behavioral health and Keppra was restarted.  Since then she report being more anxious and depressed.  She reported her last seizure was 3 days ago and she was very upset on the phone with he friends, then she passed out. Denies any injuries with these seizures.    Handedness: Right   Onset: 2013  Seizure Type: Syncope, sometimes feels lightheaded,  feels like she is going to have a panic attack   Current frequency: Last episode 3 days ago, prior to that was a few months ago   Any injuries from seizures: Hit head  Seizure risk factors: None reported   Previous ASMs: Levetiracetam, Lamotrigine   Currenty ASMs: Lamotrigine 100 mg BID  ASMs side effects: Drowsiness  Brain Images: Normal 2016  Previous EEGs: paroxysmal bursts of semi-rhythmic delta activity intermixed with sharp components   OTHER MEDICAL CONDITIONS: Anxiety. Depression   REVIEW OF SYSTEMS: Full 14 system review of systems performed and negative with exception of: As noted in the HPI    ALLERGIES: Allergies  Allergen Reactions   Guanfacine Rash    HOME MEDICATIONS: Outpatient Medications Prior to Visit  Medication Sig Dispense Refill   ARIPiprazole (ABILIFY) 10 MG tablet Take 1 tablet (10 mg total) by mouth daily. 30 tablet 2   busPIRone (BUSPAR) 10 MG tablet TAKE 1 TABLET BY MOUTH TWICE A DAY 180 tablet 0   escitalopram (LEXAPRO) 20 MG tablet TAKE 1 TABLET BY MOUTH EVERY DAY 90 tablet 0   hydrOXYzine (ATARAX) 25 MG tablet TAKE 1/2-1 TABLETS (12.5-25 MG TOTAL) BY MOUTH 2 (TWO) TIMES DAILY AS NEEDED. 30 tablet 0   MYDAYIS 25 MG CP24 Take 1 capsule by mouth daily.     traZODone (DESYREL) 50 MG tablet TAKE 1 TABLET BY MOUTH AT BEDTIME AS NEEDED FOR SLEEP. 90 tablet 0   lamoTRIgine (LAMICTAL) 25 MG tablet Week 1: 25mg  (one pill) at night.  Week 2: 25mg  (one pill) twice daily. Week 3: 50mg  (two pills) twice daily. Week 4: 75mg  (three pills) twice daily. Week 5: 100 mg (four pills) twice daily. 240 tablet 3   levETIRAcetam (KEPPRA) 500 MG tablet Take 1 tablet (500 mg total) by mouth 2 (two) times daily. 60 tablet 0   No facility-administered medications prior to visit.    PAST MEDICAL HISTORY: Past Medical History:  Diagnosis Date   ADHD (attention deficit hyperactivity disorder)    Depression    Seizures (HCC)     PAST SURGICAL HISTORY: Past Surgical  History:  Procedure Laterality Date   APPENDECTOMY  2008/2009    FAMILY HISTORY: Family History  Adopted: Yes    SOCIAL HISTORY: Social History   Socioeconomic History   Marital status: Legally Separated    Spouse name: Not on file   Number of children: 1   Years of education: Not on file   Highest education level: Bachelor's degree (e.g., BA, AB, BS)  Occupational History   Not on file  Tobacco Use   Smoking status: Never   Smokeless tobacco: Never  Vaping Use   Vaping Use: Never used  Substance and Sexual Activity   Alcohol use: Not Currently   Drug use: Not Currently    Types: Marijuana   Sexual activity: Yes  Other Topics Concern   Not on file  Social History Narrative   Right handed    Caffeine 3 cups per day    Social Determinants of Health   Financial Resource Strain: Low Risk  (08/15/2018)   Overall Financial Resource Strain (CARDIA)    Difficulty of Paying Living Expenses: Not very hard  Food Insecurity: No Food Insecurity (02/09/2018)   Hunger Vital Sign    Worried About Running Out of Food in the Last Year: Never true    Ran Out of Food in the Last Year: Never true  Transportation Needs: No Transportation Needs (02/09/2018)   PRAPARE - 08/17/2018 (Medical): No    Lack of Transportation (Non-Medical): No  Physical Activity: Inactive (02/09/2018)   Exercise Vital Sign    Days of Exercise per Week: 0 days    Minutes of Exercise per Session: 0 min  Stress: No Stress Concern Present (02/09/2018)   Administrator, [employer] Occup[employer] - Occupational Stress Questionnaire    Feeling of Stress : Not at all  Social Connections: Unknown (08/15/2018)   Social Connection and Isolation Panel [NHANES]    Frequency of Communication with Friends and Family: Not on file    Frequency of Social Gatherings with Friends and Family: Not on file    Attends Religious Services: Not on file    Active Member of Clubs or Organizations: Not on  file    Attends 02/11/2018 Meetings: Not on file    Marital Status: Living with partner  Intimate Partner Violence: Not At Risk (08/15/2018)   Humiliation, Afraid, Rape, and Kick questionnaire    Fear of Current or Ex-Partner: No    Emotionally Abused: No    Physically Abused: No    Sexually Abused: No     PHYSICAL EXAM  GENERAL EXAM/CONSTITUTIONAL: Vitals:  Vitals:   04/06/22 1009  BP: 121/79  Pulse: 85  Weight: 180 lb (81.6 kg)  Height: 5\' 3"  (1.6 m)   Body mass index is 31.89 kg/m. Wt Readings from Last 3 Encounters:  04/06/22 180 lb (81.6 kg)  03/04/22 180 lb (81.6 kg)  12/14/21 170 lb (  77.1 kg)   Patient is in no distress; well developed, nourished and groomed; neck is supple  EYES: Pupils round and reactive to light, Visual fields full to confrontation, Extraocular movements intacts,  No results found.  MUSCULOSKELETAL: Gait, strength, tone, movements noted in Neurologic exam below  NEUROLOGIC: MENTAL STATUS:      No data to display         awake, alert, oriented to person, place and time recent and remote memory intact normal attention and concentration language fluent, comprehension intact, naming intact fund of knowledge appropriate  CRANIAL NERVE:  2nd, 3rd, 4th, 6th - pupils equal and reactive to light, visual fields full to confrontation, extraocular muscles intact, no nystagmus 5th - facial sensation symmetric 7th - facial strength symmetric 8th - hearing intact 9th - palate elevates symmetrically, uvula midline 11th - shoulder shrug symmetric 12th - tongue protrusion midline  MOTOR:  normal bulk and tone, full strength in the BUE, BLE  SENSORY:  normal and symmetric to light touch  COORDINATION:  finger-nose-finger, fine finger movements normal  GAIT/STATION:  normal     DIAGNOSTIC DATA (LABS, IMAGING, TESTING) - I reviewed patient records, labs, notes, testing and imaging myself where available.  Lab Results   Component Value Date   WBC 8.2 12/16/2021   HGB 14.7 12/16/2021   HCT 44.7 12/16/2021   MCV 90.5 12/16/2021   PLT 225 12/16/2021      Component Value Date/Time   NA 140 12/16/2021 0639   K 4.1 12/16/2021 0639   CL 106 12/16/2021 0639   CO2 26 12/16/2021 0639   GLUCOSE 92 12/16/2021 0639   BUN 11 12/16/2021 0639   CREATININE 0.79 12/16/2021 0639   CALCIUM 9.3 12/16/2021 0639   PROT 7.3 12/16/2021 0639   ALBUMIN 4.1 12/16/2021 0639   AST 18 12/16/2021 0639   ALT 17 12/16/2021 0639   ALKPHOS 70 12/16/2021 0639   BILITOT 0.6 12/16/2021 0639   GFRNONAA >60 12/16/2021 0639   GFRAA >60 02/10/2018 0527   Lab Results  Component Value Date   CHOL 198 12/16/2021   HDL 63 12/16/2021   LDLCALC 123 (H) 12/16/2021   TRIG 61 12/16/2021   Lab Results  Component Value Date   HGBA1C 4.8 12/16/2021   No results found for: "VITAMINB12" Lab Results  Component Value Date   TSH 3.132 12/16/2021    EEG 2016 Abnormal EEG demonstrating a few paroxysmal bursts of semi-rhythmic delta activity intermixed with sharp components.  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  Clinical correlate is required  Brain MRI 2016 Unremarkable MRI of the brain with and without gadolinium enhancement.  EEG 03/17/22: Normal   ASSESSMENT AND PLAN  31 y.o. year old female  with anxiety and depression who is presenting for follow up for seizure.  She discontinued Keppra and currently on lamotrigine 100 mg twice daily.  Overall she is doing well, denies any adverse reaction, reports that her mood is better.  I will check a lamotrigine level and continue patient on lamotrigine 100 mg twice daily.  I will also check a BMP.  I will see her in 6 months for follow-up or sooner if worse.  She voices understanding.     1. Seizure disorder (HCC)   2. Therapeutic drug monitoring      Patient Instructions  Continue with lamotrigine 100 mg twice daily We will check a  lamotrigine level today with BMP Continue other medication Follow-up in 6 months or  sooner if worse   Per [facility] statutes, patients with seizures are not allowed to drive until they have been seizure-free for six months.  Other recommendations include using caution when using heavy equipment or power tools. Avoid working on ladders or at heights. Take showers instead of baths.  Do not swim alone.  Ensure the water temperature is not too high on the home water heater. Do not go swimming alone. Do not lock yourself in a room alone (i.e. bathroom). When caring for infants or small children, sit down when holding, feeding, or changing them to minimize risk of injury to the child in the event you have a seizure. Maintain good sleep hygiene. Avoid alcohol.  Also recommend adequate sleep, hydration, good diet and minimize stress.   During the Seizure  - First, ensure adequate ventilation and place patients on the floor on their left side  Loosen clothing around the neck and ensure the airway is patent. If the patient is clenching the teeth, do not force the mouth open with any object as this can cause severe damage - Remove all items from the surrounding that can be hazardous. The patient may be oblivious to what's happening and may not even know what he or she is doing. If the patient is confused and wandering, either gently guide him/her away and block access to outside areas - Reassure the individual and be comforting - Call 911. In most cases, the seizure ends before EMS arrives. However, there are cases when seizures may last over 3 to 5 minutes. Or the individual may have developed breathing difficulties or severe injuries. If a pregnant patient or a person with diabetes develops a seizure, it is prudent to call an ambulance. - Finally, if the patient does not regain full consciousness, then call EMS. Most patients will remain confused for about 45 to 90 minutes after a seizure, so you  must use judgment in calling for help. - Avoid restraints but make sure the patient is in a bed with padded side rails - Place the individual in a lateral position with the neck slightly flexed; this will help the saliva drain from the mouth and prevent the tongue from falling backward - Remove all nearby furniture and other hazards from the area - Provide verbal assurance as the individual is regaining consciousness - Provide the patient with privacy if possible - Call for help and start treatment as ordered by the caregiver   After the Seizure (Postictal Stage)  After a seizure, most patients experience confusion, fatigue, muscle pain and/or a headache. Thus, one should permit the individual to sleep. For the next few days, reassurance is essential. Being calm and helping reorient the person is also of importance.  Most seizures are painless and end spontaneously. Seizures are not harmful to others but can lead to complications such as stress on the lungs, brain and the heart. Individuals with prior lung problems may develop labored breathing and respiratory distress.     Orders Placed This Encounter  Procedures   Lamotrigine level   Basic Metabolic Panel    Meds ordered this encounter  Medications   lamoTRIgine (LAMICTAL) 100 MG tablet    Sig: Take 1 tablet (100 mg total) by mouth daily.    Dispense:  90 tablet    Refill:  3    Return in about 6 months (around 10/05/2022).    Windell Norfolk, MD 04/06/2022, 10:34 AM  [facility] [address] [place], [place]  [address] [phone] 

## 2022-04-06 NOTE — Patient Instructions (Signed)
Continue with lamotrigine 100 mg twice daily We will check a lamotrigine level today with BMP Continue other medication Follow-up in 6 months or sooner if worse

## 2022-04-08 LAB — BASIC METABOLIC PANEL
BUN/Creatinine Ratio: 15 (ref 9–23)
BUN: 14 mg/dL (ref 6–20)
CO2: 24 mmol/L (ref 20–29)
Calcium: 9.8 mg/dL (ref 8.7–10.2)
Chloride: 102 mmol/L (ref 96–106)
Creatinine, Ser: 0.92 mg/dL (ref 0.57–1.00)
Glucose: 78 mg/dL (ref 70–99)
Potassium: 4.2 mmol/L (ref 3.5–5.2)
Sodium: 141 mmol/L (ref 134–144)
eGFR: 86 mL/min/{1.73_m2} (ref >59)

## 2022-04-08 LAB — LAMOTRIGINE LEVEL: Lamotrigine Lvl: 2.5 ug/mL (ref 2.0–20.0)

## 2022-04-19 ENCOUNTER — Other Ambulatory Visit: Payer: Self-pay | Admitting: Psychiatry

## 2022-04-19 DIAGNOSIS — F3341 Major depressive disorder, recurrent, in partial remission: Secondary | ICD-10-CM

## 2022-04-20 ENCOUNTER — Ambulatory Visit (INDEPENDENT_AMBULATORY_CARE_PROVIDER_SITE_OTHER): Payer: BC Managed Care – PPO | Admitting: Clinical

## 2022-04-20 ENCOUNTER — Encounter: Payer: Self-pay | Admitting: Psychiatry

## 2022-04-20 ENCOUNTER — Encounter (HOSPITAL_COMMUNITY): Payer: Self-pay

## 2022-04-20 ENCOUNTER — Telehealth: Payer: BC Managed Care – PPO | Admitting: Psychiatry

## 2022-04-20 DIAGNOSIS — F411 Generalized anxiety disorder: Secondary | ICD-10-CM

## 2022-04-20 DIAGNOSIS — Z9189 Other specified personal risk factors, not elsewhere classified: Secondary | ICD-10-CM | POA: Insufficient documentation

## 2022-04-20 DIAGNOSIS — F331 Major depressive disorder, recurrent, moderate: Secondary | ICD-10-CM

## 2022-04-20 DIAGNOSIS — F1290 Cannabis use, unspecified, uncomplicated: Secondary | ICD-10-CM

## 2022-04-20 DIAGNOSIS — G4701 Insomnia due to medical condition: Secondary | ICD-10-CM

## 2022-04-20 DIAGNOSIS — F902 Attention-deficit hyperactivity disorder, combined type: Secondary | ICD-10-CM

## 2022-04-20 MED ORDER — ESCITALOPRAM OXALATE 10 MG PO TABS: 10.0000 mg | ORAL_TABLET | Freq: Every day | ORAL | 1 refills | Status: DC

## 2022-04-20 MED ORDER — ARIPIPRAZOLE 15 MG PO TABS: 15.0000 mg | ORAL_TABLET | Freq: Every day | ORAL | 1 refills | Status: DC

## 2022-04-20 NOTE — Progress Notes (Unsigned)
Virtual Visit via Video Note  I connected with Melinda Mills on 04/20/22 at  4:30 PM EST by a video enabled telemedicine application and verified that I am speaking with the correct person using two identifiers.  Location Provider Location : [facility] Patient Location : Home  Participants: Patient , Provider    I discussed the limitations of evaluation and management by telemedicine and the availability of in person appointments. The patient expressed understanding and agreed to proceed.   I discussed the assessment and treatment plan with the patient. The patient was provided an opportunity to ask questions and all were answered. The patient agreed with the plan and demonstrated an understanding of the instructions.   The patient was advised to call back or seek an in-person evaluation if the symptoms worsen or if the condition fails to improve as anticipated.   BH MD OP Progress Note  04/21/2022 8:23 AM Melinda Mills  MRN:  [id number]  Chief Complaint:  Chief Complaint  Patient presents with   Follow-up   Medication Refill   Depression   Anxiety   HPI: Melinda Mills is a 31 year old Caucasian female, separated, lives in [place], has a history of MDD, GAD, insomnia, ADHD was evaluated by telemedicine today.  Patient today reports her anxiety symptoms are currently stable.  Patient however reports she continues to feel depressed, has been feeling down almost daily, low energy, anhedonia, and sown.  Patient also reports passive thoughts of not wanting to be here, last time she felt that way was a week ago.  Reports she did not have a plan.  Currently denies any suicidality.  Reports sleep as okay most nights however there are some nights when she cannot fall asleep early and those nights she takes cannabis to fall asleep.  She reports she has been using cannabis every other day at least.  Denies any craving.  Denies any withdrawal symptoms.  Reports she will be able to cut  back.  Patient denies any homicidality or perceptual disturbances.  Reports good support system from a friend and her sister.  Reports she spent her Thanksgiving with her family.  That went well.  Currently sharing custody of her child, has her son every other week.  Continues to have relationship struggles with her spouse with whom she is separated.  Waiting for the 1 year mark to get a divorce.  Continues to be in CBT, reports that has been going well.  Trying to have more frequent sessions with her therapist.  Compliant on her medications, denies any significant side effects however wonders if she could reduce the dosage of Lexapro, did not elaborate.  Agreeable to dosage increase of her Abilify in the meantime.    Visit Diagnosis:    ICD-10-CM   1. MDD (major depressive disorder), recurrent episode, moderate (HCC)  F33.1 ARIPiprazole (ABILIFY) 15 MG tablet    escitalopram (LEXAPRO) 10 MG tablet    2. GAD (generalized anxiety disorder)  F41.1 escitalopram (LEXAPRO) 10 MG tablet    3. Insomnia due to medical condition  G47.01    mood    4. ADHD (attention deficit hyperactivity disorder), combined type  F90.2     5. Long term current use of cannabis  F12.90     6. At risk for prolonged QT interval syndrome  Z91.89 EKG 12-Lead      Past Psychiatric History: Reviewed past psychiatric history from progress note on 08/15/2018.  Past trials of Zoloft, Lexapro, BuSpar.  Past Medical History:  Past Medical  History:  Diagnosis Date   ADHD (attention deficit hyperactivity disorder)    Depression    Seizures (HCC)     Past Surgical History:  Procedure Laterality Date   APPENDECTOMY  2008/2009    Family Psychiatric History: Reviewed family psychiatric history from progress note on 08/15/2018.  Family History:  Family History  Adopted: Yes    Social History: Reviewed social history from progress note on 08/15/2018. Social History   Socioeconomic History   Marital status:  Legally Separated    Spouse name: Not on file   Number of children: 1   Years of education: Not on file   Highest education level: Bachelor's degree (e.g., BA, AB, BS)  Occupational History   Not on file  Tobacco Use   Smoking status: Never   Smokeless tobacco: Never  Vaping Use   Vaping Use: Never used  Substance and Sexual Activity   Alcohol use: Not Currently   Drug use: Not Currently    Types: Marijuana   Sexual activity: Yes  Other Topics Concern   Not on file  Social History Narrative   Right handed    Caffeine 3 cups per day    Social Determinants of Health   Financial Resource Strain: Low Risk  (08/15/2018)   Overall Financial Resource Strain (CARDIA)    Difficulty of Paying Living Expenses: Not very hard  Food Insecurity: No Food Insecurity (02/09/2018)   Hunger Vital Sign    Worried About Running Out of Food in the Last Year: Never true    Ran Out of Food in the Last Year: Never true  Transportation Needs: No Transportation Needs (02/09/2018)   PRAPARE - Administrator, Civil Service (Medical): No    Lack of Transportation (Non-Medical): No  Physical Activity: Inactive (02/09/2018)   Exercise Vital Sign    Days of Exercise per Week: 0 days    Minutes of Exercise per Session: 0 min  Stress: No Stress Concern Present (02/09/2018)   [employer] of Occupational Health - Occupational Stress Questionnaire    Feeling of Stress : Not at all  Social Connections: Unknown (08/15/2018)   Social Connection and Isolation Panel [NHANES]    Frequency of Communication with Friends and Family: Not on file    Frequency of Social Gatherings with Friends and Family: Not on file    Attends Religious Services: Not on file    Active Member of Clubs or Organizations: Not on file    Attends Banker Meetings: Not on file    Marital Status: Living with partner    Allergies:  Allergies  Allergen Reactions   Guanfacine Rash    Metabolic Disorder  Labs: Lab Results  Component Value Date   HGBA1C 4.8 12/16/2021   MPG 91.06 12/16/2021   No results found for: "PROLACTIN" Lab Results  Component Value Date   CHOL 198 12/16/2021   TRIG 61 12/16/2021   HDL 63 12/16/2021   CHOLHDL 3.1 12/16/2021   VLDL 12 12/16/2021   LDLCALC 123 (H) 12/16/2021   Lab Results  Component Value Date   TSH 3.132 12/16/2021   TSH 1.520 04/03/2021    Therapeutic Level Labs: No results found for: "LITHIUM" No results found for: "VALPROATE" No results found for: "CBMZ"  Current Medications: Current Outpatient Medications  Medication Sig Dispense Refill   ARIPiprazole (ABILIFY) 15 MG tablet Take 1 tablet (15 mg total) by mouth daily. 30 tablet 1   busPIRone (BUSPAR) 10 MG tablet TAKE 1 TABLET  BY MOUTH TWICE A DAY 180 tablet 0   escitalopram (LEXAPRO) 10 MG tablet Take 1 tablet (10 mg total) by mouth daily. 30 tablet 1   hydrOXYzine (ATARAX) 25 MG tablet TAKE 1/2-1 TABLETS (12.5-25 MG TOTAL) BY MOUTH 2 (TWO) TIMES DAILY AS NEEDED. 30 tablet 0   lamoTRIgine (LAMICTAL) 100 MG tablet Take 1 tablet (100 mg total) by mouth daily. 90 tablet 3   MYDAYIS 25 MG CP24 Take 1 capsule by mouth daily.     traZODone (DESYREL) 50 MG tablet TAKE 1 TABLET BY MOUTH AT BEDTIME AS NEEDED FOR SLEEP. 90 tablet 0   No current facility-administered medications for this visit.     Musculoskeletal: Strength & Muscle Tone:  UTA Gait & Station:  Seated Patient leans: N/A  Psychiatric Specialty Exam: Review of Systems  Psychiatric/Behavioral:  Positive for dysphoric mood.   All other systems reviewed and are negative.   There were no vitals taken for this visit.There is no height or weight on file to calculate BMI.  General Appearance: Casual  Eye Contact:  Good  Speech:  Clear and Coherent  Volume:  Normal  Mood:  Depressed  Affect:  Appropriate  Thought Process:  Goal Directed and Descriptions of Associations: Intact  Orientation:  Full (Time, Place, and Person)   Thought Content: Logical   Suicidal Thoughts:  No  Homicidal Thoughts:  No  Memory:  Immediate;   Fair Recent;   Fair Remote;   Fair  Judgement:  Fair  Insight:  Fair  Psychomotor Activity:  Normal  Concentration:  Concentration: Fair and Attention Span: Fair  Recall:  [employer] of Knowledge: Fair  Language: Fair  Akathisia:  No  Handed:  Right  AIMS (if indicated): not done  Assets:  Communication Skills Desire for Improvement Housing Social Support Talents/Skills Transportation  ADL's:  Intact  Cognition: WNL  Sleep:  Fair   Screenings: AIMS    Flowsheet Row Office Visit from 02/22/2022 in [facility] Video Visit from 01/22/2022 in [facility] Ad[facility] (Dis[facility]  AIMS Total Score 0 0 0      AUDIT    Flowsheet Row Admission (Discharged[facility]  Alcohol Use Disorder Identification Test Final Score (AUDIT) 1      GAD-7    Flowsheet Row Office Visit from 02/22/2022 in [facility] Counselor from [facility]20[facility] Video Visit from 09/10/2020 in [facility]  Total GAD-7 Score 7 11 5       PHQ2-9    Flowsheet Row Video Visit from 04/20/2022 in [facility] Office Visit from 02/22/2022 in [facility] Video Visit from 01/22/2022 in [facility] Counselor from 12/22/2021 [facility] Office Visit from 11/03/2021 in [facility]  PHQ-2 Total Score 5 2 1 2 2   PHQ-9 Total Score 13 12 4 12 12       Flowsheet Row Video Visit from 04/20/2022 in [facility] Office Visit from 02/22/2022 in [facility] Video Visit from 01/22/2022 in [facility]  C-SSRS RISK CATEGORY Moderate Risk Moderate Risk High Risk        Assessment and Plan: Phoebe Marter is a 31 year old Caucasian female, separated, employed, lives in [place], has a history of MDD, GAD, history of seizure disorder was evaluated  by telemedicine today.  Patient is currently struggling with depression, also with comorbid cannabis use, significant use, will benefit from the following plan.  Plan MDD-unstable BuSpar 10 mg p.o. twice daily Reduce Lexapro to 10 mg p.o. daily Will increase Abilify to 15 mg p.o. daily however will need an EKG first. Trazodone 50 mg at bedtime as needed  GAD-stable BuSpar 10 mg p.o. twice daily Lexapro dosage reduced as noted above. Hydroxyzine 25 mg p.o. twice daily as needed for anxiety  Insomnia-restless at times. Continue trazodone 50 mg at bedtime as needed.  Patient advised to stop using cannabis, advised to use trazodone for sleep and to work on sleep hygiene.  Long-term use of cannabis-rule out cannabis use disorder-unstable Patient provided counseling.   Provided the effect on her heart especially with medications like Abilify, tachycardia.  At risk for prolonged QT syndrome-we will order EKG-provided [phone] to get this done.  Once I review the EKG will send higher dosage of Abilify to the pharmacy.  Follow-up in clinic in 2 to 3 weeks or sooner if needed in person.   Collaboration of Care: Collaboration of Care: Referral or follow-up with counselor/therapist AEB encouraged to continue psychotherapy sessions.  Will coordinate care with therapist.  Patient/Guardian was advised Release of Information must be obtained prior to any record release in order to collaborate their care with an outside provider. Patient/Guardian was advised if they have not already done so to contact the registration department to sign all necessary forms in order for [place]  to release information regarding their care.   Consent: Patient/Guardian gives verbal consent for treatment and assignment of benefits for services provided during this visit. Patient/Guardian expressed understanding and agreed to proceed.   This note was generated in part or whole with voice recognition software. Voice recognition is usually quite accurate but there are transcription errors that can and very often do occur. I apologize for any typographical errors that were not detected and corrected.      Jomarie Longs, MD 04/21/2022, 8:23 AM
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
## 2022-05-11 ENCOUNTER — Ambulatory Visit: Payer: BC Managed Care – PPO | Admitting: Psychiatry

## 2022-05-12 ENCOUNTER — Other Ambulatory Visit
Admission: RE | Admit: 2022-05-12 | Discharge: 2022-05-12 | Disposition: A | Discharge status: Home / Self Care | Payer: BC Managed Care – PPO | Source: Ambulatory Visit | Attending: Psychiatry | Admitting: Psychiatry

## 2022-05-12 ENCOUNTER — Encounter: Payer: Self-pay | Admitting: Psychiatry

## 2022-05-12 ENCOUNTER — Ambulatory Visit: Payer: BC Managed Care – PPO | Admitting: Psychiatry

## 2022-05-12 VITALS — BP 107/70 | HR 91 | Temp 98.3°F | Ht 63.0 in | Wt 180.6 lb

## 2022-05-12 DIAGNOSIS — F902 Attention-deficit hyperactivity disorder, combined type: Secondary | ICD-10-CM

## 2022-05-12 DIAGNOSIS — F1290 Cannabis use, unspecified, uncomplicated: Secondary | ICD-10-CM | POA: Insufficient documentation

## 2022-05-12 DIAGNOSIS — F331 Major depressive disorder, recurrent, moderate: Secondary | ICD-10-CM

## 2022-05-12 DIAGNOSIS — F411 Generalized anxiety disorder: Secondary | ICD-10-CM | POA: Diagnosis not present

## 2022-05-12 DIAGNOSIS — G4701 Insomnia due to medical condition: Secondary | ICD-10-CM

## 2022-05-12 MED ORDER — TRAZODONE HCL 50 MG PO TABS: 50.0000 mg | ORAL_TABLET | Freq: Every evening | ORAL | 0 refills | Status: AC | PRN

## 2022-05-12 NOTE — Progress Notes (Unsigned)
BH MD OP Progress Note  05/12/2022 5:09 PM Tiphanie Vo  MRN:  [id number]  Chief Complaint:  Chief Complaint  Patient presents with   Follow-up   Depression   Anxiety   Medication Refill   HPI: Melinda Mills is a 31 year old Caucasian female, separated, lives in [place], employed, has a history of MDD, GAD, insomnia, ADHD was evaluated in the office today.  Patient today reports she is currently on her holiday break.  She reports she is planning to spend time with family and has several events throughout the holiday season.  Looks forward to that.  Reports she has been coparenting with her husband with whom she is separated.  That has been going well.  She does have a friend that she spends time with.  Has started going to the gym again.  Would like to keep up with that since it makes her feel better.  She is in a relationship with her boyfriend however currently they are putting it on hold although they continue to be together.  Reports depression symptoms have improved compared to how it was last visit.  Does have on and off thoughts about not wanting to be here however denies any suicidality, intent or plan.  Reports sleep is better.  Does report she has been gaining weight likely due to the Abilify.  She wants to make sure she keeps going to the gym.  Denies any other side effects.  She does not really the BuSpar is beneficial.  Interested in stopping this medication.  Reports she is compliant on her medications otherwise.  Has been cutting back on the cannabis use, aware of long-term adverse side effects and long-term plan is to stop using it.  Continues to follow-up with neurology, currently on Lamictal for seizure-like spells.  Has not had any spells since her last visit.  Denies any suicidality, homicidality or perceptual disturbances.  Continues to follow-up with Mr. Suzan Garibaldi and reports therapy sessions are beneficial.  Patient denies any other concerns  today.  Visit Diagnosis:    ICD-10-CM   1. Major depressive disorder, recurrent episode, moderate (HCC)  F33.1     2. GAD (generalized anxiety disorder)  F41.1 traZODone (DESYREL) 50 MG tablet    3. Insomnia due to medical condition  G47.01    Mood    4. ADHD (attention deficit hyperactivity disorder), combined type  F90.2     5. Long term current use of cannabis  F12.90 Urine drugs of abuse scrn w alc, routine (Ref Lab)      Past Psychiatric History: Reviewed past psychiatric history from progress note on 08/15/2018.  Past trials of Zoloft, Lexapro, BuSpar.  Past Medical History:  Past Medical History:  Diagnosis Date   ADHD (attention deficit hyperactivity disorder)    Depression    Seizures (HCC)     Past Surgical History:  Procedure Laterality Date   APPENDECTOMY  2008/2009    Family Psychiatric History: Reviewed family psychiatric history from progress note on 08/15/2018.  Family History:  Family History  Adopted: Yes    Social History: Reviewed social history from progress note on 08/15/2018. Social History   Socioeconomic History   Marital status: Legally Separated    Spouse name: Not on file   Number of children: 1   Years of education: Not on file   Highest education level: Bachelor's degree (e.g., BA, AB, BS)  Occupational History   Not on file  Tobacco Use   Smoking status: Never   Smokeless  tobacco: Never  Vaping Use   Vaping Use: Never used  Substance and Sexual Activity   Alcohol use: Not Currently   Drug use: Not Currently    Types: Marijuana   Sexual activity: Yes  Other Topics Concern   Not on file  Social History Narrative   Right handed    Caffeine 3 cups per day    Social Determinants of Health   Financial Resource Strain: Low Risk  (08/15/2018)   Overall Financial Resource Strain (CARDIA)    Difficulty of Paying Living Expenses: Not very hard  Food Insecurity: No Food Insecurity (02/09/2018)   Hunger Vital Sign    Worried About  Running Out of Food in the Last Year: Never true    Ran Out of Food in the Last Year: Never true  Transportation Needs: No Transportation Needs (02/09/2018)   PRAPARE - Administrator, Civil Service (Medical): No    Lack of Transportation (Non-Medical): No  Physical Activity: Inactive (02/09/2018)   Exercise Vital Sign    Days of Exercise per Week: 0 days    Minutes of Exercise per Session: 0 min  Stress: No Stress Concern Present (02/09/2018)   [employer] of Occupational Health - Occupational Stress Questionnaire    Feeling of Stress : Not at all  Social Connections: Unknown (08/15/2018)   Social Connection and Isolation Panel [NHANES]    Frequency of Communication with Friends and Family: Not on file    Frequency of Social Gatherings with Friends and Family: Not on file    Attends Religious Services: Not on file    Active Member of Clubs or Organizations: Not on file    Attends Banker Meetings: Not on file    Marital Status: Living with partner    Allergies:  Allergies  Allergen Reactions   Guanfacine Rash    Metabolic Disorder Labs: Lab Results  Component Value Date   HGBA1C 4.8 12/16/2021   MPG 91.06 12/16/2021   No results found for: "PROLACTIN" Lab Results  Component Value Date   CHOL 198 12/16/2021   TRIG 61 12/16/2021   HDL 63 12/16/2021   CHOLHDL 3.1 12/16/2021   VLDL 12 12/16/2021   LDLCALC 123 (H) 12/16/2021   Lab Results  Component Value Date   TSH 3.132 12/16/2021   TSH 1.520 04/03/2021    Therapeutic Level Labs: No results found for: "LITHIUM" No results found for: "VALPROATE" No results found for: "CBMZ"  Current Medications: Current Outpatient Medications  Medication Sig Dispense Refill   ARIPiprazole (ABILIFY) 10 MG tablet Take 10 mg by mouth daily.     escitalopram (LEXAPRO) 10 MG tablet Take 1 tablet (10 mg total) by mouth daily. 30 tablet 1   hydrOXYzine (ATARAX) 25 MG tablet TAKE 1/2-1 TABLETS (12.5-25 MG  TOTAL) BY MOUTH 2 (TWO) TIMES DAILY AS NEEDED. 30 tablet 0   lamoTRIgine (LAMICTAL) 100 MG tablet Take 1 tablet (100 mg total) by mouth daily. 90 tablet 3   MYDAYIS 25 MG CP24 Take 1 capsule by mouth daily.     traZODone (DESYREL) 50 MG tablet Take 1 tablet (50 mg total) by mouth at bedtime as needed. for sleep 90 tablet 0   No current facility-administered medications for this visit.     Musculoskeletal: Strength & Muscle Tone: within normal limits Gait & Station: normal Patient leans: N/A  Psychiatric Specialty Exam: Review of Systems  Psychiatric/Behavioral:  Positive for dysphoric mood.   All other systems reviewed and are negative.  Blood pressure 107/70, pulse 91, temperature 98.3 F (36.8 C), temperature source Oral, height 5\' 3"  (1.6 m), weight 180 lb 9.6 oz (81.9 kg).Body mass index is 31.99 kg/m.  General Appearance: Casual  Eye Contact:  Good  Speech:  Clear and Coherent  Volume:  Normal  Mood:  Depressed improving  Affect:  Appropriate  Thought Process:  Goal Directed and Descriptions of Associations: Intact  Orientation:  Full (Time, Place, and Person)  Thought Content: Logical   Suicidal Thoughts:  No  Homicidal Thoughts:  No  Memory:  Immediate;   Fair Recent;   Fair Remote;   Fair  Judgement:  Fair  Insight:  Fair  Psychomotor Activity:  Normal  Concentration:  Concentration: Fair and Attention Span: Fair  Recall:  F[employer]Fair  Fund of Knowledge: Fair  Language: Fair  Akathisia:  No  Handed:  Right  AIMS (if indicated): done  Assets:  Communication Skills Desire for Improvement Housing Talents/Skills Transportation  ADL's:  Intact  Cognition: WNL  Sleep:  Fair   Screenings: AIMS    Flowsheet Row Office Visit from [facility]12[facility]2022 in [facility] Office Visit from [facility]22[facility]20[facility] Video Visit from [facility] Ad[facility] (Dis[facility]  AIMS Total Score 0 0 0 0      AUDIT    Flowsheet Row Ad[facility] (Discharged[facility]  Alcohol Use Disorder Identification Test Final Score (AUDIT) 1      GAD-7    Flowsheet Row [facility] Visit [facility]22[facility]2022 [facility] Counselor from [facility] Video [facility]  Total GAD-7 Score 7 11 5       PHQ2-9    Flowsheet Row [facility] 05[facility]12[facility]2022 [facility] Video Visit [facility] Office Visit from [facility]22[facility]2022 [facility] Video Visit from 01/22/2022 [facility] Counselor from 12/22[facility]2021 [facility]  PHQ-2 Total Score 1 5 2 1 2   PHQ-9 Total Score 10 13 12 4 12       Flowsheet Row [facility] Video Visit from [facility]/20/2022 [facility] Office Visit from 02/22/2022 in [facility]  C-SSRS RISK CATEGORY Moderate Risk Moderate Risk Moderate Risk        Assessment and Plan: Johnney OuHollie Franta is a 31 year old Caucasian female, separated, employed, [place] in [place], has a history of MDD, GAD, history of seizure disorder was evaluated in office today.  Patient with improvement with regards to her mood continues to have comorbid cannabis use, interested in tapering it down and eventually stopping.  Discussed plan as noted below.  Plan MDD-improving Discontinue BuSpar for lack of benefit. Lexapro 10 mg p.o. daily Continue Abilify 10 mg p.o. daily. Trazodone 50 mg at bedtime as needed She does have Lamictal available which she uses for seizure-like spells,  could increase the dosage of Lamictal in the future.  GAD-stable Continue Lexapro 10 mg p.o. daily-reduced dosage. Continue CBT with her therapist Mr. Suzan Garibaldierry Carter. Hydroxyzine 25 mg p.o. twice daily as needed for anxiety  Insomnia-improving Trazodone 50 mg at bedtime as needed Patient advised to stop using cannabis.  Long-term use of cannabis-rule out cannabis use disorder-improving Provided counseling again. Will get urine drug screen,  patient to go to [facility] lab.   Follow-up in clinic in 5 weeks or sooner if needed.  This note was generated in part or whole with voice recognition software. Voice recognition is usually quite accurate but there are transcription errors that can and very often do occur. I apologize for any typographical errors that were not detected and corrected.     Jomarie Longs, MD 05/12/2022, 5:09 PM

## 2022-05-18 ENCOUNTER — Encounter (HOSPITAL_COMMUNITY): Payer: Self-pay

## 2022-05-18 ENCOUNTER — Ambulatory Visit (HOSPITAL_COMMUNITY): Payer: BC Managed Care – PPO | Admitting: Clinical

## 2022-05-19 LAB — URINE DRUGS OF ABUSE SCREEN W ALC, ROUTINE (REF LAB)
Barbiturate, Ur: NEGATIVE ng/mL
Benzodiazepine Quant, Ur: NEGATIVE ng/mL
Cocaine (Metab.): NEGATIVE ng/mL
Ethanol U, Quan: NEGATIVE %
Methadone Screen, Urine: NEGATIVE ng/mL
Opiate Quant, Ur: NEGATIVE ng/mL
Phencyclidine, Ur: NEGATIVE ng/mL
Propoxyphene, Urine: NEGATIVE ng/mL

## 2022-05-19 LAB — AMPHETAMINE CONF, UR
Amphetamine GC/MS Conf: >3000 ng/mL
Amphetamine, Ur: POSITIVE — AB
Amphetamines: POSITIVE — AB
Methamphetamine, Ur: NEGATIVE

## 2022-05-19 LAB — PANEL 799049
CARBOXY THC GC/MS CONF: 39 ng/mL
Cannabinoid GC/MS, Ur: POSITIVE — AB

## 2022-05-20 ENCOUNTER — Other Ambulatory Visit: Payer: Self-pay | Admitting: Psychiatry

## 2022-05-20 DIAGNOSIS — F331 Major depressive disorder, recurrent, moderate: Secondary | ICD-10-CM

## 2022-05-20 DIAGNOSIS — F411 Generalized anxiety disorder: Secondary | ICD-10-CM

## 2022-06-01 ENCOUNTER — Ambulatory Visit (INDEPENDENT_AMBULATORY_CARE_PROVIDER_SITE_OTHER): Payer: BC Managed Care – PPO | Admitting: Clinical

## 2022-06-01 DIAGNOSIS — F411 Generalized anxiety disorder: Secondary | ICD-10-CM

## 2022-06-01 DIAGNOSIS — F331 Major depressive disorder, recurrent, moderate: Secondary | ICD-10-CM
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
## 2022-06-04 ENCOUNTER — Other Ambulatory Visit: Payer: Self-pay | Admitting: Neurology

## 2022-06-21 ENCOUNTER — Ambulatory Visit: Payer: BC Managed Care – PPO | Admitting: Psychiatry

## 2022-06-22 ENCOUNTER — Ambulatory Visit (INDEPENDENT_AMBULATORY_CARE_PROVIDER_SITE_OTHER): Payer: BC Managed Care – PPO | Admitting: Clinical

## 2022-06-22 DIAGNOSIS — F411 Generalized anxiety disorder: Secondary | ICD-10-CM

## 2022-06-22 DIAGNOSIS — F331 Major depressive disorder, recurrent, moderate: Secondary | ICD-10-CM

## 2022-06-22 NOTE — Progress Notes (Signed)
Virtual Visit via Video Note   I connected with [name] on 06/22/22 at  2:00 PM EST by a video enabled telemedicine application and verified that I am speaking with the correct person using two identifiers.   Location: Patient: Home Provider: Office   I discussed the limitations of evaluation and management by telemedicine and the availability of in person appointments. The patient expressed understanding and agreed to proceed.   THERAPIST PROGRESS NOTE     Session Time: 2:00 PM-2:45 PM   Participation Level: Active   Behavioral Response: Casual and Alert,Depressed   Type of Therapy: Individual Therapy   Treatment Goals addressed: Depression and Anxiety   Interventions: CBT   Summary: Melinda Mills a 32 y.o. female who presents with  MDD/ GAD . The OPT therapist worked with the patient for her OPT treatment. The OPT therapist utilized Motivational Interviewing to assist in creating therapeutic repore. The patient in the session was engaged and work in collaboration giving feedback about her triggers and symptoms over the past few weeks. The patient spoke about interactions with  her ex boyfriend over the past few weeks as well as upcoming court hearing of the ex boyfriend coming up within the next week as previously this was continued in which there is a likelihood of him going to [place]. The OPT therapist utilized Cognitive Behavioral Therapy through cognitive restructuring as well as worked with the patient on coping strategies to assist in management of mood and anxiety.  The OPT therapist overviewed in session with the patient the impact of the ex- boyfriend being incarcerated. The patient spoke about her realization that it is not logical, however, described continuing to talk with her ex-boyfriend , however, affirms she will discontinue her interaction with him right after going to visit him one last time this weekend.The OPT therapist reviewed as well with the patient basic need  areas examining the patients current eating habits, sleep schedule, exercise, and hygiene.    Suicidal/Homicidal: Nowithout intent/plan   Therapist Response:The OPT therapist worked with the patient for the patients scheduled session. The patient was engaged in her session and gave feedback in relation to triggers, symptoms, and behavior responses over the past few weeks. The OPT therapist worked with the patient utilizing an in session Cognitive Behavioral Therapy exercise. The patient was responsive in the session and verbalized, " I realize I am not making a decision that makes since but I am bored". The OPT therapist worked with the patient providing ongoing psycho-education. The OPT therapist worked with the patient evaluating her use of coping to balance her external stressors. The patient spoke about her goal of getting back into working out at the gym.The patient spoke about her med management and the OPT therapist over-viewed upcoming appointments as listed in her [place]. The OPT therapist will work with the patient in her next scheduled session.     Plan: return in 2/3 weeks    Diagnosis:      Axis I: MDD/ GAD   Axis II: No diagnosis       Collaboration of Care: No additional collaboration for this session.   Patient/Guardian was advised Release of Information must be obtained prior to any record release in order to collaborate their care with an outside provider. Patient/Guardian was advised if they have not already done so to contact the registration department to sign all necessary forms in order for [place] to release information regarding their care.    Consent: Patient/Guardian gives verbal consent for  treatment and assignment of benefits for services provided during this visit. Patient/Guardian expressed understanding and agreed to proceed.    I discussed the assessment and treatment plan with the patient. The patient was provided an opportunity to ask questions and all were answered.  The patient agreed with the plan and demonstrated an understanding of the instructions.   The patient was advised to call back or seek an in-person evaluation if the symptoms worsen or if the condition fails to improve as anticipated.   I provided 45 minutes of non-face-to-face time during this encounter.   Lennox Grumbles, LCSW   06/22/2022

## 2022-06-28 ENCOUNTER — Encounter: Payer: Self-pay | Admitting: Psychiatry

## 2022-06-28 ENCOUNTER — Ambulatory Visit: Payer: BC Managed Care – PPO | Admitting: Psychiatry

## 2022-06-28 VITALS — BP 104/68 | HR 90 | Temp 98.1°F | Ht 63.5 in | Wt 173.0 lb

## 2022-06-28 DIAGNOSIS — F411 Generalized anxiety disorder: Secondary | ICD-10-CM | POA: Diagnosis not present

## 2022-06-28 DIAGNOSIS — F3342 Major depressive disorder, recurrent, in full remission: Secondary | ICD-10-CM | POA: Diagnosis not present

## 2022-06-28 DIAGNOSIS — G4701 Insomnia due to medical condition: Secondary | ICD-10-CM | POA: Diagnosis not present

## 2022-06-28 DIAGNOSIS — F902 Attention-deficit hyperactivity disorder, combined type: Secondary | ICD-10-CM

## 2022-06-28 DIAGNOSIS — F1290 Cannabis use, unspecified, uncomplicated: Secondary | ICD-10-CM

## 2022-06-28 MED ORDER — ARIPIPRAZOLE 10 MG PO TABS: 5.0000 mg | ORAL_TABLET | Freq: Every day | ORAL | 1 refills | Status: DC

## 2022-06-28 NOTE — Progress Notes (Unsigned)
[facility] MD OP Progress Note  06/28/2022 4:54 PM Sonia Bromell  MRN:  [id number]  Chief Complaint:  Chief Complaint  Patient presents with   Follow-up   Anxiety   Medication Refill   Depression   HPI: Melinda Mills is a 32 year old Caucasian female, separated, lives in [place], employed, has a history of MDD, GAD, insomnia, ADHD was evaluated in office today.  Patient today reports overall she is doing well with regards to her mood.  Denies any significant sadness, hopelessness.  She however reports she is tired of her current job since it is a lot on her.  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  She is unable to get any kind of support.  She reports she often wakes up in the morning not wanting to go to work.  She hence has started applying to new jobs.  Currently awaiting that.  Patient reports overall she has been otherwise doing fairly well.  Has not had any seizure-like spells since her last visit.  Has been making use of her support system.  Has been spending time with her son whom she shares custody with her ex-husband.  That is currently going well.  Patient reports she is happy that she may have lost a few pounds since her last visit.  Currently walking as well as motivated to start exercising regularly.  Has stopped using cannabis.  Reports she is currently compliant on Mydayis for her attentional focus problems.  Continues to follow-up with her provider for the same.  Denies any suicidality, homicidality or perceptual disturbances.  Patient denies any other concerns today.  Visit Diagnosis:    ICD-10-CM   1. MDD (major depressive disorder), recurrent, in full remission ([place])  F33.42 ARIPiprazole (ABILIFY) 10 MG tablet    2. GAD (generalized anxiety disorder)  F41.1 ARIPiprazole (ABILIFY) 10 MG tablet    3. Insomnia due to medical condition  G47.01    mood    4. ADHD (attention deficit hyperactivity disorder), combined  type  F90.2     5. Long term current use of cannabis  F12.90       Past Psychiatric History: Reviewed past psychiatric history from progress note on 08/15/2018.  Past trials of Zoloft, Lexapro, BuSpar.  Past Medical History:  Past Medical History:  Diagnosis Date   ADHD (attention deficit hyperactivity disorder)    Depression    Seizures ([place])     Past Surgical History:  Procedure Laterality Date   APPENDECTOMY  2008/2009    Family Psychiatric History: Reviewed family psychiatric history from progress note on 08/15/2018.  Family History:  Family History  Adopted: Yes    Social History: Reviewed social history from progress note on 08/15/2018. Social History   Socioeconomic History   Marital status: Legally Separated    Spouse name: Not on file   Number of children: 1   Years of education: Not on file   Highest education level: Bachelor's degree (e.g., BA, AB, BS)  Occupational History   Not on file  Tobacco Use   Smoking status: Never   Smokeless tobacco: Never  Vaping Use   Vaping Use: Never used  Substance and Sexual Activity   Alcohol use: Not Currently    Comment: A few drinks per month   Drug use: Not Currently    Types: Marijuana   Sexual activity: Yes    Partners: Female    Birth control/protection: Condom  Other Topics Concern  Not on file  Social History Narrative   Right handed    Caffeine 3 cups per day    Social Determinants of Health   Financial Resource Strain: Low Risk  (08/15/2018)   Overall Financial Resource Strain (CARDIA)    Difficulty of Paying Living Expenses: Not very hard  Food Insecurity: No Food Insecurity (02/09/2018)   Hunger Vital Sign    Worried About Running Out of Food in the Last Year: Never true    Ran Out of Food in the Last Year: Never true  Transportation Needs: No Transportation Needs (02/09/2018)   PRAPARE - Administrator, Civil Service (Medical): No    Lack of Transportation (Non-Medical): No   Physical Activity: Inactive (02/09/2018)   Exercise Vital Sign    Days of Exercise per Week: 0 days    Minutes of Exercise per Session: 0 min  Stress: No Stress Concern Present (02/09/2018)   [employer] of Occupational Health - Occupational Stress Questionnaire    Feeling of Stress : Not at all  Social Connections: Unknown (08/15/2018)   Social Connection and Isolation Panel [NHANES]    Frequency of Communication with Friends and Family: Not on file    Frequency of Social Gatherings with Friends and Family: Not on file    Attends Religious Services: Not on file    Active Member of Clubs or Organizations: Not on file    Attends Banker Meetings: Not on file    Marital Status: Living with partner    Allergies:  Allergies  Allergen Reactions   Guanfacine Rash    Metabolic Disorder Labs: Lab Results  Component Value Date   HGBA1C 4.8 12/16/2021   MPG 91.06 12/16/2021   No results found for: "PROLACTIN" Lab Results  Component Value Date   CHOL 198 12/16/2021   TRIG 61 12/16/2021   HDL 63 12/16/2021   CHOLHDL 3.1 12/16/2021   VLDL 12 12/16/2021   LDLCALC 123 (H) 12/16/2021   Lab Results  Component Value Date   TSH 3.132 12/16/2021   TSH 1.520 04/03/2021    Therapeutic Level Labs: No results found for: "LITHIUM" No results found for: "VALPROATE" No results found for: "CBMZ"  Current Medications: Current Outpatient Medications  Medication Sig Dispense Refill   escitalopram (LEXAPRO) 10 MG tablet TAKE 1 TABLET BY MOUTH EVERY DAY 90 tablet 0   hydrOXYzine (ATARAX) 25 MG tablet TAKE 1/2-1 TABLETS (12.5-25 MG TOTAL) BY MOUTH 2 (TWO) TIMES DAILY AS NEEDED. 30 tablet 0   lamoTRIgine (LAMICTAL) 100 MG tablet Take 1 tablet (100 mg total) by mouth daily. 90 tablet 3   MYDAYIS 25 MG CP24 Take 1 capsule by mouth daily.     traZODone (DESYREL) 50 MG tablet Take 1 tablet (50 mg total) by mouth at bedtime as needed. for sleep 90 tablet 0   ARIPiprazole (ABILIFY)  10 MG tablet Take 0.5 tablets (5 mg total) by mouth daily. 30 tablet 1   No current facility-administered medications for this visit.     Musculoskeletal: Strength & Muscle Tone: within normal limits Gait & Station: normal Patient leans: N/A  Psychiatric Specialty Exam: Review of Systems  Psychiatric/Behavioral: Negative.    All other systems reviewed and are negative.   Blood pressure 104/68, pulse 90, temperature 98.1 F (36.7 C), height 5' 3.5" (1.613 m), weight 173 lb (78.5 kg), SpO2 99 %.Body mass index is 30.16 kg/m.  General Appearance: Casual  Eye Contact:  Fair  Speech:  Clear and Coherent  Volume:  Normal  Mood:  Euthymic  Affect:  Congruent  Thought Process:  Goal Directed and Descriptions of Associations: Intact  Orientation:  Full (Time, Place, and Person)  Thought Content: Logical   Suicidal Thoughts:  No  Homicidal Thoughts:  No  Memory:  Immediate;   Fair Recent;   Fair Remote;   Fair  Judgement:  Fair  Insight:  Fair  Psychomotor Activity:  Normal  Concentration:  Concentration: Fair and Attention Span: Fair  Recall:  [employer] of Knowledge: Fair  Language: Fair  Akathisia:  No  Handed:  Right  AIMS (if indicated): done  Assets:  Communication Skills Desire for Improvement Housing Social Support  ADL's:  Intact  Cognition: WNL  Sleep:  Fair   Screenings: [facility] Office Visit from 06/28/2022 in [facility] Office Visit from 05/12/2022 in [facility] Office Visit from 02/22/2022 in [facility] Video Visit from 01/22/2022 in [facility] Admission (Discharged) from 12/15/2021 in [facility]  AIMS Total Score 0 0 0 0 0      AUDIT    Flowsheet Row Admission (Discharged) from 12/15/2021 in [facility]  Alcohol  Use Disorder Identification Test Final Score (AUDIT) 1      GAD-7    Flowsheet Row Office Visit from 06/28/2022 in [facility] Office Visit from 02/22/2022 in [facility] Counselor from 12/22/2021 in [facility] Video Visit from 09/10/2020 in [facility]  Total GAD-7 Score 6 7 11 5       PHQ2-9    [facility] Office Visit from 06/28/2022 in [facility] Office Visit from 05/12/2022 in [facility] Video Visit from 04/20/2022 in [facility] Office Visit from 02/22/2022 in [facility] Video Visit from 01/22/2022 in [facility]  PHQ-2 Total Score 2 1 5 2 1   PHQ-9 Total Score 7 10 13 12 4       [facility] Office Visit from 06/28/2022 in [facility] Office Visit from 05/12/2022 in [facility] Video Visit from 04/20/2022 in [facility] No Risk Moderate Risk Moderate Risk        Assessment and Plan: Vassie Kugel is a 32 year old Caucasian female, separated, employed, lives in [place], has a history of MDD, GAD, history of seizure disorder was evaluated in office today.  Patient is currently improving, agreeable to tapering down off Abilify, will benefit from the following plan.  Plan MDD in remission Lexapro 10 mg p.o. daily Trazodone 50 mg at bedtime. Reduce Abilify to 5 mg p.o. daily due to concerns of long-term side effects. Also on Lamictal for seizure-like spells per neurology.  Lamictal is also a mood stabilizer.  GAD-stable Lexapro 10 mg p.o. daily-reduced dosage Hydroxyzine 25 mg  p.o. twice daily as needed for anxiety Continue CBT with therapist Mr. Maye Hides  Insomnia-stable Trazodone 50 mg at bedtime.  Long-term use of cannabis-rule out cannabis use disorder-in early remission Patient reports she quit using cannabis Will monitor closely  Follow-up in clinic in 2 months or sooner if needed.   This note was generated  in part or whole with voice recognition software. Voice recognition is usually quite accurate but there are transcription errors that can and very often do occur. I apologize for any typographical errors that were not detected and corrected.     Ursula Alert, MD 06/29/2022, 1:07 PM

## 2022-07-02 ENCOUNTER — Other Ambulatory Visit: Payer: Self-pay | Admitting: Psychiatry

## 2022-07-02 DIAGNOSIS — F411 Generalized anxiety disorder: Secondary | ICD-10-CM

## 2022-07-02 DIAGNOSIS — F3342 Major depressive disorder, recurrent, in full remission: Secondary | ICD-10-CM

## 2022-07-05 ENCOUNTER — Ambulatory Visit (INDEPENDENT_AMBULATORY_CARE_PROVIDER_SITE_OTHER): Payer: BC Managed Care – PPO | Admitting: Clinical

## 2022-07-05 DIAGNOSIS — F411 Generalized anxiety disorder: Secondary | ICD-10-CM

## 2022-07-05 DIAGNOSIS — F32A Depression, unspecified: Secondary | ICD-10-CM

## 2022-07-05 DIAGNOSIS — F331 Major depressive disorder, recurrent, moderate: Secondary | ICD-10-CM

## 2022-07-05 NOTE — Progress Notes (Signed)
Virtual Visit via Video Note   I connected with Melinda Mills on 07/05/22 at  3:00 PM EST by a video enabled telemedicine application and verified that I am speaking with the correct person using two identifiers.   Location: Patient: Home Provider: Office   I discussed the limitations of evaluation and management by telemedicine and the availability of in person appointments. The patient expressed understanding and agreed to proceed.   THERAPIST PROGRESS NOTE     Session Time: 3:00 PM-3:55 PM   Participation Level: Active   Behavioral Response: Casual and Alert,Depressed   Type of Therapy: Individual Therapy   Treatment Goals addressed: Depression and Anxiety   Interventions: CBT   Summary: Melinda Mills a 32 y.o. female who presents with  MDD/ GAD . The OPT therapist worked with the patient for her OPT treatment. The OPT therapist utilized Motivational Interviewing to assist in creating therapeutic repore. The patient in the session was engaged and work in collaboration giving feedback about her triggers and symptoms over the past few weeks. The patient spoke about interactions with  her ex boyfriend over the past few weeks as well as outcome of the  court hearing of the ex boyfriend who was sentenced to 30 days served over weekends in [place]. The OPT therapist utilized Cognitive Behavioral Therapy through cognitive restructuring as well as worked with the patient on coping strategies to assist in management of mood and anxiety.  The OPT therapist overviewed in session with the patient the impact of the ex- boyfriend being incarcerated over weekends. The patient spoke about her realization that it is not logical, however, described wanting to continue the relationship with the ex-boyfriend , however, affirms she will discontinue her interaction with him if there interaction does not improve.The OPT therapist reviewed as well with the patient basic need areas examining the patients current  eating habits, sleep schedule, exercise, and hygiene. The OPT therapist overviewed the potential danger of the back and forth pattern of the interaction between her and the ex-boyfriend.   Suicidal/Homicidal: Nowithout intent/plan   Therapist Response:The OPT therapist worked with the patient for the patients scheduled session. The patient was engaged in her session and gave feedback in relation to triggers, symptoms, and behavior responses over the past few weeks. The OPT therapist worked with the patient utilizing an in session Cognitive Behavioral Therapy exercise. The patient was responsive in the session and verbalized, " I realize that I may have should have walked away but I like the challenge and can't walk away wondering what if and I want to see now that the stress of what is going to happen is over if he will change and we potentially could have something together". The OPT therapist worked with the patient providing ongoing psycho-education. The OPT therapist worked with the patient evaluating her use of coping to balance her external stressors. The patient spoke about her goal of getting back into working out at the gym.The patient spoke about her med management and the OPT therapist over-viewed upcoming appointments as listed in her [facility]. The OPT therapist will work with the patient in her next scheduled session.     Plan: return in 2/3 weeks    Diagnosis:      Axis I: MDD/ GAD   Axis II: No diagnosis       Collaboration of Care: No additional collaboration for this session.   Patient/Guardian was advised Release of Information must be obtained prior to any record release in order to  collaborate their care with an outside provider. Patient/Guardian was advised if they have not already done so to contact the registration department to sign all necessary forms in order for [place] to release information regarding their care.    Consent: Patient/Guardian gives verbal consent for treatment  and assignment of benefits for services provided during this visit. Patient/Guardian expressed understanding and agreed to proceed.    I discussed the assessment and treatment plan with the patient. The patient was provided an opportunity to ask questions and all were answered. The patient agreed with the plan and demonstrated an understanding of the instructions.   The patient was advised to call back or seek an in-person evaluation if the symptoms worsen or if the condition fails to improve as anticipated.   I provided 55 minutes of non-face-to-face time during this encounter.   Lennox Grumbles, LCSW   07/05/2022

## 2022-07-27 ENCOUNTER — Ambulatory Visit (HOSPITAL_COMMUNITY): Payer: BC Managed Care – PPO | Admitting: Clinical

## 2022-07-27 DIAGNOSIS — F331 Major depressive disorder, recurrent, moderate: Secondary | ICD-10-CM

## 2022-07-27 DIAGNOSIS — F411 Generalized anxiety disorder: Secondary | ICD-10-CM | POA: Diagnosis not present

## 2022-07-27 NOTE — Progress Notes (Signed)
Virtual Visit via Video Note   I connected with Melinda Mills on 07/27/22 at  2:00 PM EST by a video enabled telemedicine application and verified that I am speaking with the correct person using two identifiers.   Location: Patient: Home Provider: Office   I discussed the limitations of evaluation and management by telemedicine and the availability of in person appointments. The patient expressed understanding and agreed to proceed.   THERAPIST PROGRESS NOTE     Session Time: 2:00 PM-2:55 PM   Participation Level: Active   Behavioral Response: Casual and Alert,Depressed   Type of Therapy: Individual Therapy   Treatment Goals addressed: Depression and Anxiety   Interventions: CBT   Summary: Melinda Mills a 32 y.o. female who presents with  MDD/ GAD . The OPT therapist worked with the patient for her OPT treatment. The OPT therapist utilized Motivational Interviewing to assist in creating therapeutic repore. The patient in the session was engaged and work in collaboration giving feedback about her triggers and symptoms over the past few weeks. The patient spoke about interactions with  her ex boyfriend over the past few weeks noting she got into a conflict over the weekend, however, noted it was not a large conflict. The patient spoke about interactions since the ex-boyfriend has been doing weekends in jail as part of his sentence from recent court hearing. The OPT therapist utilized Cognitive Behavioral Therapy through cognitive restructuring as well as worked with the patient on coping strategies to assist in management of mood and anxiety.The OPT therapist reviewed as well with the patient basic need areas examining the patients current eating habits, sleep schedule, exercise, and hygiene. The OPT therapist overviewed the potential danger of the back and forth pattern of the interaction between her and the ex-boyfriend.   Suicidal/Homicidal: Nowithout intent/plan   Therapist  Response:The OPT therapist worked with the patient for the patients scheduled session. The patient was engaged in her session and gave feedback in relation to triggers, symptoms, and behavior responses over the past few weeks. The OPT therapist worked with the patient utilizing an in session Cognitive Behavioral Therapy exercise. The patient was responsive in the session and verbalized, " It has been nice to have time apart during the weekend". The patient spoke about spending time with her son and working for the most part during the weekends.The OPT therapist worked with the patient providing ongoing psycho-education. The OPT therapist worked with the patient evaluating her use of coping to balance her external stressors. The patient spoke about her goal of getting back into working out at the gym.The patient spoke about her med management and the OPT therapist over-viewed upcoming appointments as listed in her [facility]. The OPT therapist will work with the patient in her next scheduled session.     Plan: return in 2/3 weeks    Diagnosis:      Axis I: MDD/ GAD   Axis II: No diagnosis       Collaboration of Care: No additional collaboration for this session.   Patient/Guardian was advised Release of Information must be obtained prior to any record release in order to collaborate their care with an outside provider. Patient/Guardian was advised if they have not already done so to contact the registration department to sign all necessary forms in order for [place] to release information regarding their care.    Consent: Patient/Guardian gives verbal consent for treatment and assignment of benefits for services provided during this visit. Patient/Guardian expressed understanding and agreed to  proceed.    I discussed the assessment and treatment plan with the patient. The patient was provided an opportunity to ask questions and all were answered. The patient agreed with the plan and demonstrated an  understanding of the instructions.   The patient was advised to call back or seek an in-person evaluation if the symptoms worsen or if the condition fails to improve as anticipated.   I provided 55 minutes of non-face-to-face time during this encounter.   Lennox Grumbles, LCSW   07/27/2022

## 2022-08-10 ENCOUNTER — Ambulatory Visit (INDEPENDENT_AMBULATORY_CARE_PROVIDER_SITE_OTHER): Payer: BC Managed Care – PPO | Admitting: Clinical

## 2022-08-10 DIAGNOSIS — F411 Generalized anxiety disorder: Secondary | ICD-10-CM | POA: Diagnosis not present

## 2022-08-10 DIAGNOSIS — F339 Major depressive disorder, recurrent, unspecified: Secondary | ICD-10-CM

## 2022-08-10 DIAGNOSIS — F331 Major depressive disorder, recurrent, moderate: Secondary | ICD-10-CM

## 2022-08-10 NOTE — Progress Notes (Signed)
Virtual Visit via Video Note   I connected with Melinda Mills on 08/10/22 at  2:00 PM EST by a video enabled telemedicine application and verified that I am speaking with the correct person using two identifiers.   Location: Patient: Home Provider: Office   I discussed the limitations of evaluation and management by telemedicine and the availability of in person appointments. The patient expressed understanding and agreed to proceed.   THERAPIST PROGRESS NOTE     Session Time: 2:00 PM-2:30 PM   Participation Level: Active   Behavioral Response: Casual and Alert,Depressed   Type of Therapy: Individual Therapy   Treatment Goals addressed: Depression and Anxiety   Interventions: CBT   Summary: Melinda Mills a 32 y.o. female who presents with  MDD/ GAD . The OPT therapist worked with the patient for her OPT treatment. The OPT therapist utilized Motivational Interviewing to assist in creating therapeutic repore. The patient in the session was engaged and work in collaboration giving feedback about her triggers and symptoms over the past few weeks. The patient spoke about interactions with  her ex boyfriend  who she has remained in contact with after initially verbalizing she was ready to leave the relationship and in this session spoke about going to stay with the ex-boyfriend due to being back in the area to address a traffic violation she acquired while in the area back in January. The OPT therapist utilized Cognitive Behavioral Therapy through cognitive restructuring as well as worked with the patient on coping strategies to assist in management of mood and anxiety.The OPT therapist reviewed as well with the patient basic need areas examining the patients current eating habits, sleep schedule, exercise, and hygiene. The patient noted in this session she has continued her interaction with the ex-boyfriend as if she was in the relationship, however, they are not officially back together. The  patient spoke about both herself and her ex-boyfriend are at a place where they are going to just see how things go as the ex-boyfriend will soon be completing his court ordered weekends in [place].The OPT therapist overviewed the potential danger of the back and forth pattern of the interaction between her and the ex-boyfriend which had before the ex-boyfriends court hearing and weekend incarceration become toxic.   Suicidal/Homicidal: Nowithout intent/plan   Therapist Response:The OPT therapist worked with the patient for the patients scheduled session. The patient was engaged in her session and gave feedback in relation to triggers, symptoms, and behavior responses over the past few weeks. The OPT therapist worked with the patient utilizing an in session Cognitive Behavioral Therapy exercise. The patient was responsive in the session and verbalized, " We are going to see where things go and I did inquire because I do not want to put my time and effort into things for it not to turn out with [place] together". The patient spoke about spending time with her son and working for the most part during the weekends.The OPT therapist worked with the patient providing ongoing psycho-education. The OPT therapist worked with the patient evaluating her use of coping to balance her external stressors. 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 to spend time with family as part of a mini-vacation. The patient spoke about her med management and the OPT therapist over-viewed upcoming appointments as listed  in her [facility]. The OPT therapist will work with the patient in her next scheduled session.     Plan: return in 2/3 weeks    Diagnosis:      Axis I: MDD/ GAD   Axis II: No diagnosis       Collaboration of Care: No additional collaboration for this session.   Patient/Guardian was advised Release of Information must be obtained prior to any record release in order to collaborate their care with an outside provider. Patient/Guardian was advised if they have not already done so to contact the registration department to sign all necessary forms in order for [place] to release information regarding their care.    Consent: Patient/Guardian gives verbal consent for treatment and assignment of benefits for services provided during this visit. Patient/Guardian expressed understanding and agreed to proceed.    I discussed the assessment and treatment plan with the patient. The patient was provided an opportunity to ask questions and all were answered. The patient agreed with the plan and demonstrated an understanding of the instructions.   The patient was advised to call back or seek an in-person evaluation if the symptoms worsen or if the condition fails to improve as anticipated.   I provided 30 minutes of non-face-to-face time during this encounter.   Lennox Grumbles, LCSW   08/10/2022

## 2022-08-24 ENCOUNTER — Ambulatory Visit (HOSPITAL_COMMUNITY): Payer: BC Managed Care – PPO | Admitting: Clinical

## 2022-08-26 ENCOUNTER — Encounter: Payer: Self-pay | Admitting: Psychiatry

## 2022-08-26 ENCOUNTER — Ambulatory Visit: Payer: BC Managed Care – PPO | Admitting: Psychiatry

## 2022-08-26 VITALS — BP 115/76 | HR 84 | Temp 97.7°F | Ht 63.5 in | Wt 167.6 lb

## 2022-08-26 DIAGNOSIS — F3342 Major depressive disorder, recurrent, in full remission: Secondary | ICD-10-CM | POA: Diagnosis not present

## 2022-08-26 DIAGNOSIS — G4701 Insomnia due to medical condition: Secondary | ICD-10-CM

## 2022-08-26 DIAGNOSIS — F902 Attention-deficit hyperactivity disorder, combined type: Secondary | ICD-10-CM | POA: Diagnosis not present

## 2022-08-26 DIAGNOSIS — F411 Generalized anxiety disorder: Secondary | ICD-10-CM | POA: Diagnosis not present

## 2022-08-26 DIAGNOSIS — F1290 Cannabis use, unspecified, uncomplicated: Secondary | ICD-10-CM

## 2022-08-26 MED ORDER — ESCITALOPRAM OXALATE 10 MG PO TABS: 10.0000 mg | ORAL_TABLET | Freq: Every day | ORAL | 0 refills | Status: DC

## 2022-08-26 MED ORDER — ARIPIPRAZOLE 2 MG PO TABS: 2.0000 mg | ORAL_TABLET | Freq: Every day | ORAL | 0 refills | Status: DC

## 2022-08-26 NOTE — Progress Notes (Signed)
[facility] MD OP Progress Note  08/26/2022 3:43 PM Teslyn Delahoz  MRN:  [id number]  Chief Complaint:  Chief Complaint  Patient presents with   Follow-up   Depression   Anxiety   HPI: Melinda Mills is a 32 year old Caucasian female, separated, lives in [place], employed, has a history of MDD, GAD, insomnia, ADHD was evaluated in office today.   Patient today reports she is currently on her spring break.  She reports she hence feels relaxed.  She also reports although she reduced the Abilify to 5 mg last visit it did not affect her much with regards to her mood.  She has not noticed a difference.  She denies sadness, hopelessness, anhedonia.  Reports anxiety symptoms are manageable.  Patient reports sleep is overall good except when her 2-year-old wakes her up in the middle of the night.  Uses the trazodone as needed.  Trazodone does make her groggy if she takes it too late.  Patient is compliant on the Lexapro.  Denies side effects.  Patient denies any seizure-like spells recently.  Continues to take the Lamictal.  Reports she has upcoming appointment with neurology.  Patient denies any suicidality, homicidality or perceptual disturbances.  Patient denies any other concerns today.   Visit Diagnosis:    ICD-10-CM   1. MDD (major depressive disorder), recurrent, in full remission  F33.42 ARIPiprazole (ABILIFY) 2 MG tablet    2. GAD (generalized anxiety disorder)  F41.1 escitalopram (LEXAPRO) 10 MG tablet    3. Insomnia due to medical condition  G47.01    mood    4. ADHD (attention deficit hyperactivity disorder), combined type  F90.2     5. Long term current use of cannabis  F12.90       Past Psychiatric History: I have reviewed past psychiatric history from progress note on 08/15/2018.  Past trials of Zoloft, Lexapro, BuSpar  Past Medical History:  Past Medical History:  Diagnosis Date   ADHD (attention deficit hyperactivity disorder)    Depression    Seizures     Past  Surgical History:  Procedure Laterality Date   APPENDECTOMY  2008/2009    Family Psychiatric History: Reviewed family psychiatric history from progress note on 08/15/2018.  Family History:  Family History  Adopted: Yes    Social History: Reviewed social history from progress note on 08/15/2018. Social History   Socioeconomic History   Marital status: Legally Separated    Spouse name: Not on file   Number of children: 1   Years of education: Not on file   Highest education level: Bachelor's degree (e.g., BA, AB, BS)  Occupational History   Not on file  Tobacco Use   Smoking status: Never   Smokeless tobacco: Never  Vaping Use   Vaping Use: Never used  Substance and Sexual Activity   Alcohol use: Not Currently    Comment: A few drinks per month   Drug use: Not Currently    Types: Marijuana   Sexual activity: Yes    Partners: Female    Birth control/protection: Condom  Other Topics Concern   Not on file  Social History Narrative   Right handed    Caffeine 3 cups per day    Social Determinants of Health   Financial Resource Strain: Low Risk  (08/15/2018)   Overall Financial Resource Strain (CARDIA)    Difficulty of Paying Living Expenses: Not very hard  Food Insecurity: No Food Insecurity (02/09/2018)   Hunger Vital Sign    Worried About Running Out  of Food in the Last Year: Never true    [place] in the Last Year: Never true  Transportation Needs: No Transportation Needs (02/09/2018)   PRAPARE - Hydrologist (Medical): No    Lack of Transportation (Non-Medical): No  Physical Activity: Inactive (02/09/2018)   Exercise Vital Sign    Days of Exercise per Week: 0 days    Minutes of Exercise per Session: 0 min  Stress: No Stress Concern Present (02/09/2018)   [place]    Feeling of Stress : Not at all  Social Connections: Unknown (08/15/2018)   Social Connection and  Isolation Panel [NHANES]    Frequency of Communication with Friends and Family: Not on file    Frequency of Social Gatherings with Friends and Family: Not on file    Attends Religious Services: Not on file    Active Member of Clubs or Organizations: Not on file    Attends Archivist Meetings: Not on file    Marital Status: Living with partner    Allergies:  Allergies  Allergen Reactions   Guanfacine Rash    Metabolic Disorder Labs: Lab Results  Component Value Date   HGBA1C 4.8 12/16/2021   MPG 91.06 12/16/2021   No results found for: "PROLACTIN" Lab Results  Component Value Date   CHOL 198 12/16/2021   TRIG 61 12/16/2021   HDL 63 12/16/2021   CHOLHDL 3.1 12/16/2021   VLDL 12 12/16/2021   LDLCALC 123 (H) 12/16/2021   Lab Results  Component Value Date   TSH 3.132 12/16/2021   TSH 1.520 04/03/2021    Therapeutic Level Labs: No results found for: "LITHIUM" No results found for: "VALPROATE" No results found for: "CBMZ"  Current Medications: Current Outpatient Medications  Medication Sig Dispense Refill   amphetamine-dextroamphetamine (ADDERALL XR) 25 MG 24 hr capsule Take by mouth every morning.     ARIPiprazole (ABILIFY) 2 MG tablet Take 1 tablet (2 mg total) by mouth daily for 14 days. 14 tablet 0   hydrOXYzine (ATARAX) 25 MG tablet TAKE 1/2-1 TABLETS (12.5-25 MG TOTAL) BY MOUTH 2 (TWO) TIMES DAILY AS NEEDED. 30 tablet 0   lamoTRIgine (LAMICTAL) 100 MG tablet Take 1 tablet (100 mg total) by mouth daily. 90 tablet 3   MYDAYIS 25 MG CP24 Take 1 capsule by mouth daily.     traZODone (DESYREL) 50 MG tablet Take 1 tablet (50 mg total) by mouth at bedtime as needed. for sleep 90 tablet 0   escitalopram (LEXAPRO) 10 MG tablet Take 1 tablet (10 mg total) by mouth daily. 90 tablet 0   No current facility-administered medications for this visit.     Musculoskeletal: Strength & Muscle Tone: within normal limits Gait & Station: normal Patient leans:  N/A  Psychiatric Specialty Exam: Review of Systems  Psychiatric/Behavioral: Negative.    All other systems reviewed and are negative.   Blood pressure 115/76, pulse 84, temperature 97.7 F (36.5 C), temperature source Skin, height 5' 3.5" (1.613 m), weight 167 lb 9.6 oz (76 kg).Body mass index is 29.22 kg/m.  General Appearance: Casual  Eye Contact:  Fair  Speech:  Clear and Coherent  Volume:  Normal  Mood:  Euthymic  Affect:  Congruent  Thought Process:  Goal Directed and Descriptions of Associations: Intact  Orientation:  Full (Time, Place, and Person)  Thought Content: Logical   Suicidal Thoughts:  No  Homicidal Thoughts:  No  Memory:  Immediate;   Fair Recent;   Fair Remote;   Fair  Judgement:  Fair  Insight:  Fair  Psychomotor Activity:  Normal  Concentration:  Concentration: Fair and Attention Span: Fair  Recall:  [employer] of Knowledge: Fair  Language: Fair  Akathisia:  No  Handed:  Right  AIMS (if indicated): not done  Assets:  Communication Skills Desire for Improvement Housing Social Support  ADL's:  Intact  Cognition: WNL  Sleep:  Fair   Screenings: [facility] Office Visit from 06/28/2022 in [facility] Office Visit from 05/12/2022 in [facility] Office Visit from 02/22/2022 in [facility] Video Visit from 01/22/2022 in [facility] Admission (Discharged) from 12/15/2021 in [facility]  AIMS Total Score 0 0 0 0 0      AUDIT    Flowsheet Row Admission (Discharged) from 12/15/2021 in [facility]  Alcohol Use Disorder Identification Test Final Score (AUDIT) 1      GAD-7    Flowsheet Row Office Visit from 08/26/2022 in [facility] Office Visit from 06/28/2022 in [facility] Office Visit from 02/22/2022 in [facility] Counselor from 12/22/2021 in [facility] Video Visit from 09/10/2020 in [facility]  Total GAD-7 Score 4 6 7 11 5       PHQ2-9    [place] Office Visit from 08/26/2022 in [place] Office Visit from 06/28/2022 in [facility] Office Visit from 05/12/2022 in [facility] Video Visit from 04/20/2022 in [facility] Office Visit from 02/22/2022 in [facility]  PHQ-2 Total Score 1 2 1 5 2   PHQ-9 Total Score 4 7 10 13 12       [facility] Office Visit from 08/26/2022 in [facility] Office Visit from 06/28/2022 in [facility] Office Visit from 05/12/2022 in [facility] CATEGORY Moderate Risk No Risk Moderate Risk        Assessment and Plan: Verdine Dring is a 32 year old Caucasian female, separated, employed, lives in [place], has a history of MDD, GAD, history of seizure disorder was evaluated in office today.  Patient is currently stable, agreeable to tapering off of Abilify.  Plan as noted below.  Plan MDD in remission Lexapro 10 mg p.o. daily Trazodone 50 mg at bedtime Reduce Abilify to 2 mg p.o. daily for 2 weeks and stop taking it. Patient to monitor her symptoms and if she has any worsening mood symptoms patient advised to let writer know. Lamictal, continue the same dosage, prescribed by neurology for seizures although it is also a mood stabilizer.  GAD-stable Lexapro 10 mg p.o. daily. Hydroxyzine 25 mg p.o. twice daily as needed Continue CBT with Mr.  Maye Hides  Insomnia-stable Trazodone 50 mg at bedtime  Long-term use of cannabis-in remission Will monitor closely  Follow-up in clinic in 6 to 8 weeks or sooner if needed.  Collaboration of Care: Collaboration of Care: Referral or follow-up with counselor/therapist AEB patient encouraged to continue psychotherapy.  Patient/Guardian was advised Release of Information must be obtained prior to any record release in  order to collaborate their care with an outside provider. Patient/Guardian was advised if they have not already done so to contact the registration department to sign all necessary forms in order for [place] to release information regarding their care.   Consent: Patient/Guardian gives verbal consent for treatment and assignment of benefits for services provided during this visit. Patient/Guardian expressed understanding and agreed to proceed.   This note was generated in part or whole with voice recognition software. Voice recognition is usually quite accurate but there are transcription errors that can and very often do occur. I apologize for any typographical errors that were not detected and corrected.    Ursula Alert, MD 08/26/2022, 3:43 PM

## 2022-09-20 ENCOUNTER — Ambulatory Visit (INDEPENDENT_AMBULATORY_CARE_PROVIDER_SITE_OTHER): Payer: BC Managed Care – PPO | Admitting: Clinical

## 2022-09-20 DIAGNOSIS — F331 Major depressive disorder, recurrent, moderate: Secondary | ICD-10-CM

## 2022-09-20 DIAGNOSIS — F411 Generalized anxiety disorder: Secondary | ICD-10-CM | POA: Diagnosis not present
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
## 2022-10-05 ENCOUNTER — Ambulatory Visit: Payer: BC Managed Care – PPO | Admitting: Neurology

## 2022-10-05 ENCOUNTER — Encounter: Payer: Self-pay | Admitting: Neurology

## 2022-10-05 VITALS — BP 120/80 | HR 97 | Ht 63.0 in | Wt 169.0 lb

## 2022-10-05 DIAGNOSIS — G40909 Epilepsy, unspecified, not intractable, without status epilepticus: Secondary | ICD-10-CM | POA: Diagnosis not present

## 2022-10-05 MED ORDER — LAMOTRIGINE 100 MG PO TABS: 100.0000 mg | ORAL_TABLET | Freq: Two times a day (BID) | ORAL | 3 refills | Status: DC

## 2022-10-05 NOTE — Patient Instructions (Signed)
Increase lamotrigine to 100 mg twice daily Continue your other medications Follow-up in 1 year or sooner if worse, at that time we will check a lamotrigine level.

## 2022-10-05 NOTE — Progress Notes (Signed)
[facility]  PATIENT: Melinda Mills DOB: [birth year]  REQUESTING CLINICIAN: Shane Crutch, PA HISTORY FROM: Patient  REASON FOR VISIT: Seizure   HISTORICAL  CHIEF COMPLAINT:  Chief Complaint  Patient presents with   Follow-up    Rm 14. Patient alone, no reports of seizure like activity, but has had aura feeling a couple of times since last visit.     INTERVAL HISTORY 10/05/2022:  Melinda Mills presents today for follow-up, last visit was in November and since then she has been doing well, she was recommended to continue with lamotrigine 100 mg twice daily but again she has been taking the medication daily. Denies any seizure or seizure-like activity.  She does state a couple times she had presyncopal episodes but actually did not fall.  Otherwise she is doing very well, she is compliant with the medication, even though she is taking it once a day instead of twice daily, denies any side effect.  No seizure or seizure activity, no concerns, or questions.    INTERVAL HISTORY 04/06/2022:  Patient presents today for follow-up, last visit was October 12, at that time plan was to switch the Keppra to lamotrigine because of side effect and she reports switching the medication and increasing the lamotrigine to 100 mg twice daily.  Reports that  her mood is good, she discontinued Keppra, denies any seizure or seizure-like activity.  Overall she is doing very well. No complaints, no concerns.    HISTORY OF PRESENT ILLNESS:  This is a 32 year old woman past medical history of major depressive disorder, anxiety disorder who is presenting to establish care for seizure.  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  At that time, she was told that her seizures were induced by stress.  She described them as fainting episodes, sometime associated with twitching.  She was told during her seizures, her breathing gets shallow.  She reports  prior to these events sometimes she will feel like almost having a panic attack, that she feels lightheaded then she passes out.  She did have a EEG in 2016 which showed epileptogenic potential, therefore she was started on Keppra.  She has been on Keppra for 5 years then discontinued.  She reported back in August she was admitted at  behavioral health and Keppra was restarted.  Since then she report being more anxious and depressed.  She reported her last seizure was 3 days ago and she was very upset on the phone with he friends, then she passed out. Denies any injuries with these seizures.    Handedness: Right   Onset: 2013  Seizure Type: Syncope, sometimes feels lightheaded, feels like she is going to have a panic attack   Current frequency: Last episode 3 days ago, prior to that was a few months ago   Any injuries from seizures: Hit head  Seizure risk factors: None reported   Previous ASMs: Levetiracetam, Lamotrigine   Currenty ASMs: Lamotrigine 100 mg BID  ASMs side effects: Drowsiness  Brain Images: Normal 2016  Previous EEGs: paroxysmal bursts of semi-rhythmic delta activity intermixed with sharp components   OTHER MEDICAL CONDITIONS: Anxiety. Depression   REVIEW OF SYSTEMS: Full 14 system review of systems performed and negative with exception of: As noted in the HPI    ALLERGIES: Allergies  Allergen Reactions   Guanfacine Rash    HOME MEDICATIONS: Outpatient Medications Prior to Visit  Medication Sig Dispense Refill   amphetamine-dextroamphetamine (ADDERALL XR)  25 MG 24 hr capsule Take by mouth every morning.     hydrOXYzine (ATARAX) 25 MG tablet TAKE 1/2-1 TABLETS (12.5-25 MG TOTAL) BY MOUTH 2 (TWO) TIMES DAILY AS NEEDED. 30 tablet 0   traZODone (DESYREL) 50 MG tablet Take 1 tablet (50 mg total) by mouth at bedtime as needed. for sleep 90 tablet 0   lamoTRIgine (LAMICTAL) 100 MG tablet Take 1 tablet (100 mg total) by mouth daily. 90 tablet 3   ARIPiprazole  (ABILIFY) 2 MG tablet Take 1 tablet (2 mg total) by mouth daily for 14 days. 14 tablet 0   escitalopram (LEXAPRO) 10 MG tablet Take 1 tablet (10 mg total) by mouth daily. 90 tablet 0   MYDAYIS 25 MG CP24 Take 1 capsule by mouth daily.     No facility-administered medications prior to visit.    PAST MEDICAL HISTORY: Past Medical History:  Diagnosis Date   ADHD (attention deficit hyperactivity disorder)    Depression    Seizures (HCC)     PAST SURGICAL HISTORY: Past Surgical History:  Procedure Laterality Date   APPENDECTOMY  2008/2009    FAMILY HISTORY: Family History  Adopted: Yes    SOCIAL HISTORY: Social History   Socioeconomic History   Marital status: Legally Separated    Spouse name: Not on file   Number of children: 1   Years of education: Not on file   Highest education level: Bachelor's degree (e.g., BA, AB, BS)  Occupational History   Not on file  Tobacco Use   Smoking status: Never   Smokeless tobacco: Never  Vaping Use   Vaping Use: Never used  Substance and Sexual Activity   Alcohol use: Not Currently    Comment: A few drinks per month   Drug use: Not Currently    Types: Marijuana   Sexual activity: Yes    Partners: Female    Birth control/protection: Condom  Other Topics Concern   Not on file  Social History Narrative   Right handed    Caffeine 3 cups per day    Social Determinants of Health   Financial Resource Strain: Low Risk  (08/15/2018)   Overall Financial Resource Strain (CARDIA)    Difficulty of Paying Living Expenses: Not very hard  Food Insecurity: No Food Insecurity (02/09/2018)   Hunger Vital Sign    Worried About Running Out of Food in the Last Year: Never true    Ran Out of Food in the Last Year: Never true  Transportation Needs: No Transportation Needs (02/09/2018)   PRAPARE - Administrator, Civil Service (Medical): No    Lack of Transportation (Non-Medical): No  Physical Activity: Inactive (02/09/2018)    Exercise Vital Sign    Days of Exercise per Week: 0 days    Minutes of Exercise per Session: 0 min  Stress: No Stress Concern Present (02/09/2018)   [employer] of Occupational Health - Occupational Stress Questionnaire    Feeling of Stress : Not at all  Social Connections: Unknown (08/15/2018)   Social Connection and Isolation Panel [NHANES]    Frequency of Communication with Friends and Family: Not on file    Frequency of Social Gatherings with Friends and Family: Not on file    Attends Religious Services: Not on file    Active Member of Clubs or Organizations: Not on file    Attends Banker Meetings: Not on file    Marital Status: Living with partner  Intimate Partner Violence: Not At Risk (  08/15/2018)   Humiliation, Afraid, Rape, and Kick questionnaire    Fear of Current or Ex-Partner: No    Emotionally Abused: No    Physically Abused: No    Sexually Abused: No     PHYSICAL EXAM  GENERAL EXAM/CONSTITUTIONAL: Vitals:  Vitals:   10/05/22 1451  BP: 120/80  Pulse: 97  Weight: 169 lb (76.7 kg)  Height: 5\' 3"  (1.6 m)    Body mass index is 29.94 kg/m. Wt Readings from Last 3 Encounters:  10/05/22 169 lb (76.7 kg)  04/06/22 180 lb (81.6 kg)  03/04/22 180 lb (81.6 kg)   Patient is in no distress; well developed, nourished and groomed; neck is supple  MUSCULOSKELETAL: Gait, strength, tone, movements noted in Neurologic exam below  NEUROLOGIC: MENTAL STATUS:      No data to display         awake, alert, oriented to person, place and time recent and remote memory intact normal attention and concentration language fluent, comprehension intact, naming intact fund of knowledge appropriate  CRANIAL NERVE:  2nd, 3rd, 4th, 6th - visual fields full to confrontation, extraocular muscles intact, no nystagmus 5th - facial sensation symmetric 7th - facial strength symmetric 8th - hearing intact 9th - palate elevates symmetrically, uvula midline 11th -  shoulder shrug symmetric 12th - tongue protrusion midline  MOTOR:  normal bulk and tone, full strength in the BUE, BLE  SENSORY:  normal and symmetric to light touch  COORDINATION:  finger-nose-finger, fine finger movements normal  GAIT/STATION:  normal     DIAGNOSTIC DATA (LABS, IMAGING, TESTING) - I reviewed patient records, labs, notes, testing and imaging myself where available.  Lab Results  Component Value Date   WBC 8.2 12/16/2021   HGB 14.7 12/16/2021   HCT 44.7 12/16/2021   MCV 90.5 12/16/2021   PLT 225 12/16/2021      Component Value Date/Time   NA 141 04/06/2022 1034   K 4.2 04/06/2022 1034   CL 102 04/06/2022 1034   CO2 24 04/06/2022 1034   GLUCOSE 78 04/06/2022 1034   GLUCOSE 92 12/16/2021 0639   BUN 14 04/06/2022 1034   CREATININE 0.92 04/06/2022 1034   CALCIUM 9.8 04/06/2022 1034   PROT 7.3 12/16/2021 0639   ALBUMIN 4.1 12/16/2021 0639   AST 18 12/16/2021 0639   ALT 17 12/16/2021 0639   ALKPHOS 70 12/16/2021 0639   BILITOT 0.6 12/16/2021 0639   GFRNONAA >60 12/16/2021 0639   GFRAA >60 02/10/2018 0527   Lab Results  Component Value Date   CHOL 198 12/16/2021   HDL 63 12/16/2021   LDLCALC 123 (H) 12/16/2021   TRIG 61 12/16/2021   Lab Results  Component Value Date   HGBA1C 4.8 12/16/2021   No results found for: "VITAMINB12" Lab Results  Component Value Date   TSH 3.132 12/16/2021    EEG 2016 Abnormal EEG demonstrating a few paroxysmal bursts of semi-rhythmic delta activity intermixed with sharp components.  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  Clinical correlate is required  Brain MRI 2016 Unremarkable MRI of the brain with and without gadolinium enhancement.  EEG 03/17/22: Normal   ASSESSMENT AND PLAN  32 y.o. year old female  with anxiety and depression who is presenting for follow up for seizure.  She denies any seizure or seizure activity since last visit.  She is on lamotrigine 100  mg daily, due to abnormal EEG, will recommend her to increase the lamotrigine to 100 mg twice  daily.  Advised her to contact me if she has a seizure or seizure activity.  Follow up in 1 year for follow-up, at that time, we will check a Lamotrigine level.    1. Seizure disorder [facility])      Patient Instructions  Increase lamotrigine to 100 mg twice daily Continue your other medications Follow-up in 1 year or sooner if worse, at that time we will check a lamotrigine level.   Per [facility] [place] statutes, patients with seizures are not allowed to drive until they have been seizure-free for six months.  Other recommendations include using caution when using heavy equipment or power tools. Avoid working on ladders or at heights. Take showers instead of baths.  Do not swim alone.  Ensure the water temperature is not too high on the home water heater. Do not go swimming alone. Do not lock yourself in a room alone (i.e. bathroom). When caring for infants or small children, sit down when holding, feeding, or changing them to minimize risk of injury to the child in the event you have a seizure. Maintain good sleep hygiene. Avoid alcohol.  Also recommend adequate sleep, hydration, good diet and minimize stress.   During the Seizure  - First, ensure adequate ventilation and place patients on the floor on their left side  Loosen clothing around the neck and ensure the airway is patent. If the patient is clenching the teeth, do not force the mouth open with any object as this can cause severe damage - Remove all items from the surrounding that can be hazardous. The patient may be oblivious to what's happening and may not even know what he or she is doing. If the patient is confused and wandering, either gently guide him/her away and block access to outside areas - Reassure the individual and be comforting - Call 911. In most cases, the seizure ends before EMS arrives. However, there are cases when seizures  may last over 3 to 5 minutes. Or the individual may have developed breathing difficulties or severe injuries. If a pregnant patient or a person with diabetes develops a seizure, it is prudent to call an ambulance. - Finally, if the patient does not regain full consciousness, then call EMS. Most patients will remain confused for about 45 to 90 minutes after a seizure, so you must use judgment in calling for help. - Avoid restraints but make sure the patient is in a bed with padded side rails - Place the individual in a lateral position with the neck slightly flexed; this will help the saliva drain from the mouth and prevent the tongue from falling backward - Remove all nearby furniture and other hazards from the area - Provide verbal assurance as the individual is regaining consciousness - Provide the patient with privacy if possible - Call for help and start treatment as ordered by the caregiver   After the Seizure (Postictal Stage)  After a seizure, most patients experience confusion, fatigue, muscle pain and/or a headache. Thus, one should permit the individual to sleep. For the next few days, reassurance is essential. Being calm and helping reorient the person is also of importance.  Most seizures are painless and end spontaneously. Seizures are not harmful to others but can lead to complications such as stress on the lungs, brain and the heart. Individuals with prior lung problems may develop labored breathing and respiratory distress.     No orders of the defined types were placed in this encounter.   Meds ordered  this encounter  Medications   lamoTRIgine (LAMICTAL) 100 MG tablet    Sig: Take 1 tablet (100 mg total) by mouth 2 (two) times daily.    Dispense:  180 tablet    Refill:  3    Return in about 1 year (around 10/05/2023).  I have spent a total of 30 minutes dedicated to this patient today, preparing to see patient, performing a medically appropriate examination and  evaluation, ordering tests and/or medications and procedures, and counseling and educating the patient/family/caregiver; independently interpreting result and communicating results to the family/patient/caregiver; and documenting clinical information in the electronic medical record.   Windell Norfolk, MD 10/05/2022, 3:45 PM  [facility] [address], [address] [place], [place] [address] [phone]

## 2022-10-21 ENCOUNTER — Encounter: Payer: Self-pay | Admitting: Neurology

## 2022-10-21 NOTE — Telephone Encounter (Signed)
No additional recommendation, seizure likely from medication noncompliance. Please advice to take Lamotrigine 100 mg twice daily. (200 mg total daily dose)

## 2022-10-21 NOTE — Telephone Encounter (Signed)
Called and spoke to patient to see how she was doing, she says she feels fine now besides a mild headache from the concussion, she states that she does not have any other injuries. She does reports that she missed two days of her medicaiton due to camping with her family. She also reports that it lasted a couple of minutes but no one was with her so she is unsure of exact time frame. Wanted to give the update on the concussion and she reports she is taking 150mg . 100mg  in the am and 50mg  in the pm, outside of missing the two days.

## 2022-10-26 ENCOUNTER — Ambulatory Visit (INDEPENDENT_AMBULATORY_CARE_PROVIDER_SITE_OTHER): Payer: BC Managed Care – PPO | Admitting: Clinical

## 2022-10-26 DIAGNOSIS — F411 Generalized anxiety disorder: Secondary | ICD-10-CM | POA: Diagnosis not present

## 2022-10-26 DIAGNOSIS — F331 Major depressive disorder, recurrent, moderate: Secondary | ICD-10-CM | POA: Diagnosis not present

## 2022-10-26 NOTE — Progress Notes (Signed)
Virtual Visit via Video Note   I connected with Melinda Mills on 10/26/22 at  3:00 PM EST by a video enabled telemedicine application and verified that I am speaking with the correct person using two identifiers.   Location: Patient: Home Provider: Office   I discussed the limitations of evaluation and management by telemedicine and the availability of in person appointments. The patient expressed understanding and agreed to proceed.   THERAPIST PROGRESS NOTE     Session Time: 3:00 PM-3:55 PM   Participation Level: Active   Behavioral Response: Casual and Alert,Depressed   Type of Therapy: Individual Therapy   Treatment Goals addressed: Depression and Anxiety   Interventions: CBT   Summary: Melinda Mills a 32 y.o. female who presents with  MDD/ GAD . The OPT therapist worked with the patient for her OPT treatment. The OPT therapist utilized Motivational Interviewing to assist in creating therapeutic repore. The patient in the session was engaged and work in collaboration giving feedback about her triggers and symptoms over the past few weeks. 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 The patient spoke about interactions with her boyfriend over the past few weeks noting the interaction has gotten better. The OPT therapist utilized Cognitive Behavioral Therapy through cognitive restructuring as well as worked with the patient on coping strategies to assist in management of mood and anxiety.The OPT therapist reviewed as well with the patient basic need areas examining the patients current eating habits, sleep schedule, exercise, and hygiene. The OPT therapist reviewed with the patient her mood and anxiety on scale over the course of the past few weeks.    Suicidal/Homicidal: Nowithout intent/plan   Therapist Response:The OPT therapist worked with the patient for  the patients scheduled session. The patient was engaged in her session and gave feedback in relation to triggers, symptoms, and behavior responses over the past few weeks. The OPT therapist worked with the patient utilizing an in session Cognitive Behavioral Therapy exercise. The patient was responsive in the session and verbalized, " Things have been going well I am ready for the end of the school year and at the end of the week I am going with my family to a concert and staying at [place] ".The OPT therapist worked with the patient providing ongoing psycho-education. The OPT therapist worked with the patient evaluating her use of coping to balance her external stressors. The patient spoke about consistency without a lot of changes over the past few weeks. The patient spoke about her process in formally filing for divorce as she has now been separated for the past year. The OPT therapist over-viewed upcoming appointments as listed in her MyChart. The OPT therapist will work with the patient in her next scheduled session.     Plan: return in 2/3 weeks    Diagnosis:      Axis I: MDD/ GAD   Axis II: No diagnosis       Collaboration of Care: Overview of the patients involvement in the med therapy program with Dr. Elna Breslow.   Patient/Guardian was advised Release of Information must be obtained prior to any record release in order to collaborate their care with an outside provider. Patient/Guardian was advised if they have not already done so to contact the registration department to sign all necessary forms in order for [place] to release  information regarding their care.    Consent: Patient/Guardian gives verbal consent for treatment and assignment of benefits for services provided during this visit. Patient/Guardian expressed understanding and agreed to proceed.    I discussed the assessment and treatment plan with the patient. The patient was provided an opportunity to ask questions and all were  answered. The patient agreed with the plan and demonstrated an understanding of the instructions.   The patient was advised to call back or seek an in-person evaluation if the symptoms worsen or if the condition fails to improve as anticipated.   I provided 55 minutes of non-face-to-face time during this encounter.   [name], LCSW   10/26/2022

## 2022-11-23 ENCOUNTER — Ambulatory Visit (INDEPENDENT_AMBULATORY_CARE_PROVIDER_SITE_OTHER): Payer: BC Managed Care – PPO | Admitting: Clinical

## 2022-11-23 DIAGNOSIS — F411 Generalized anxiety disorder: Secondary | ICD-10-CM

## 2022-11-23 DIAGNOSIS — F339 Major depressive disorder, recurrent, unspecified: Secondary | ICD-10-CM

## 2022-11-23 DIAGNOSIS — F331 Major depressive disorder, recurrent, moderate: Secondary | ICD-10-CM

## 2022-11-23 NOTE — Progress Notes (Signed)
Virtual Visit via Telephone Note (patient unable to use video)  I connected with Melinda Mills on 11/23/22 at  3:00 PM EDT by telephone and verified that I am speaking with the correct person using two identifiers.  Location: Patient: Home Provider: Office   I discussed the limitations, risks, security and privacy concerns of performing an evaluation and management service by telephone and the availability of in person appointments. I also discussed with the patient that there may be a patient responsible charge related to this service. The patient expressed understanding and agreed to proceed.  THERAPIST PROGRESS NOTE     Session Time: 3:00 PM-3:55 PM   Participation Level: Active   Behavioral Response: Casual and Alert,Depressed   Type of Therapy: Individual Therapy   Treatment Goals addressed: Depression and Anxiety   Interventions: CBT   Summary: Melinda Mills a 32 y.o. female who presents with  MDD/ GAD . The OPT therapist worked with the patient for her OPT treatment. The OPT therapist utilized Motivational Interviewing to assist in creating therapeutic repore. The patient in the session was engaged and work in collaboration giving feedback about her triggers and symptoms over the past few weeks. The patient spoke about her part time job ending due to business shutting down and she is currently looking for a replacement job. The patient spoke about her experience going to a concert recently. The patient spoke about interactions with her boyfriend over the past few weeks noting the interaction has been good. The OPT therapist utilized Cognitive Behavioral Therapy through cognitive restructuring as well as worked with the patient on coping strategies to assist in management of mood and anxiety.The OPT therapist reviewed as well with the patient basic need areas examining the patients current eating habits, sleep schedule, exercise, and hygiene. The OPT therapist reviewed with the patient  her mood and anxiety on scale over the course of the past few weeks. The patient spoke about her concern in trying to get her ex-husband to allow her to take their son around her current boyfriend.   Suicidal/Homicidal: Nowithout intent/plan   Therapist Response:The OPT therapist worked with the patient for the patients scheduled session. The patient was engaged in her session and gave feedback in relation to triggers, symptoms, and behavior responses over the past few weeks. The OPT therapist worked with the patient utilizing an in session Cognitive Behavioral Therapy exercise. 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 The OPT therapist worked with the patient evaluating her use of coping to balance her external stressors. The patient spoke about consistency without a lot of changes over the past few weeks. The patient spoke about her challenge of trying to get her ex-husband to agree to allow her son to be around her current boyfriend. The OPT therapist over-viewed upcoming appointments as listed in her MyChart. The OPT therapist will work with the patient in her next scheduled session.     Plan: return in 2/3 weeks    Diagnosis:      Axis I: MDD/ GAD   Axis II: No diagnosis       Collaboration of Care: Overview of the patients involvement in the med therapy program with Dr. Elna Breslow.   Patient/Guardian was advised Release of Information must be obtained  prior to any record release in order to collaborate their care with an outside provider. Patient/Guardian was advised if they have not already done so to contact the registration department to sign all necessary forms in order for [place] to release information regarding their care.    Consent:  Patient/Guardian gives verbal consent for treatment and assignment of benefits for services provided during this visit. Patient/Guardian expressed understanding and agreed to proceed.    I discussed the assessment and treatment plan with the patient. The patient was provided an opportunity to ask questions and all were answered. The patient agreed with the plan and demonstrated an understanding of the instructions.   The patient was advised to call back or seek an in-person evaluation if the symptoms worsen or if the condition fails to improve as anticipated.   I provided 55 minutes of non-face-to-face time during this encounter.   [name], LCSW   11/23/2022

## 2022-12-28 ENCOUNTER — Ambulatory Visit (INDEPENDENT_AMBULATORY_CARE_PROVIDER_SITE_OTHER): Payer: BC Managed Care – PPO | Admitting: Clinical

## 2022-12-28 DIAGNOSIS — F411 Generalized anxiety disorder: Secondary | ICD-10-CM | POA: Diagnosis not present

## 2022-12-28 DIAGNOSIS — F329 Major depressive disorder, single episode, unspecified: Secondary | ICD-10-CM

## 2022-12-28 DIAGNOSIS — F331 Major depressive disorder, recurrent, moderate: Secondary | ICD-10-CM

## 2022-12-28 NOTE — Progress Notes (Signed)
Virtual Visit via Video Note  I connected with Melinda Mills on 12/28/22 at  3:00 PM EDT by a video enabled telemedicine application and verified that I am speaking with the correct person using two identifiers.  Location: Patient: home Provider: office   I discussed the limitations of evaluation and management by telemedicine and the availability of in person appointments. The patient expressed understanding and agreed to proceed.   THERAPIST PROGRESS NOTE     Session Time: 3:00 PM-3:55 PM   Participation Level: Active   Behavioral Response: Casual and Alert,Depressed   Type of Therapy: Individual Therapy   Treatment Goals addressed: Depression and Anxiety   Interventions: CBT   Summary: Melinda Mills a 32 y.o. female who presents with  MDD/ GAD . The OPT therapist worked with the patient for her OPT treatment. The OPT therapist utilized Motivational Interviewing to assist in creating therapeutic repore. The patient in the session was engaged and work in collaboration giving feedback about her triggers and symptoms over the past few weeks. The patient spoke about being late for her menstruation cycle and this triggering the patient the take pregnancy tests. The at home pregnancy tests rendered mixed results and this led the patient to be seen at the Emergency Room which also led to mixed results. The patient is schedule this Thursday to see her OBGYN to further determine if she is pregnant .The patient spoke about her experience through the last week including her interactions with family and her partner. The OPT therapist utilized Cognitive Behavioral Therapy through cognitive restructuring as well as worked with the patient on coping strategies to assist in management of mood and anxiety.The OPT therapist reviewed as well with the patient basic need areas examining the patients current eating habits, sleep schedule, exercise, and hygiene. The OPT therapist worked with the patient on  trying to not get consumed with stressors connected to life changing event of being pregnant before knowing definitively if she is or is not pregnant.    Suicidal/Homicidal: Nowithout intent/plan   Therapist Response:The OPT therapist worked with the patient for the patients scheduled session. The patient was engaged in her session and gave feedback in relation to triggers, symptoms, and behavior responses over the past few weeks. The OPT therapist worked with the patient utilizing an in session Cognitive Behavioral Therapy exercise. The patient was responsive in the session and verbalized, " Things have been a roller coaster and even know as I am talking with you I have no idea for certain if I am or am not pregnant 5/7 at home test said I am but then I started my period I went to the er and they couldn't tell me ( excerpt from ER visit notes the following - Discussed test results, test is negative but there may be a very faint line; recommended to wait 1 week and test again or see OB.Marland Kitchen  ".The OPT therapist worked with the patient providing ongoing psycho-education. The OPT therapist worked with the patient evaluating her use of coping to balance her external stressors. The patient spoke about the impact of stress related to  changes over the past few weeks current uncertainty around her being pregnant or not. The patient will be seeing her OBGYN this Thursday for further review and determination of pregnancy status. The OPT therapist will work with the patient in her next scheduled session.     Plan: return in 2/3 weeks    Diagnosis:      Axis I: MDD/ GAD  Axis II: No diagnosis       Collaboration of Care: Overview of the patients involvement in the med therapy program with Dr. Elna Breslow.   Patient/Guardian was advised Release of Information must be obtained prior to any record release in order to collaborate their care with an outside provider. Patient/Guardian was advised if they have not already  done so to contact the registration department to sign all necessary forms in order for [place] to release information regarding their care.    Consent: Patient/Guardian gives verbal consent for treatment and assignment of benefits for services provided during this visit. Patient/Guardian expressed understanding and agreed to proceed.    I discussed the assessment and treatment plan with the patient. The patient was provided an opportunity to ask questions and all were answered. The patient agreed with the plan and demonstrated an understanding of the instructions.   The patient was advised to call back or seek an in-person evaluation if the symptoms worsen or if the condition fails to improve as anticipated.   I provided 55 minutes of non-face-to-face time during this encounter.   [name], LCSW   12/28/2022

## 2023-01-27 ENCOUNTER — Other Ambulatory Visit: Payer: Self-pay | Admitting: Oncology

## 2023-01-27 DIAGNOSIS — Z006 Encounter for examination for normal comparison and control in clinical research program: Secondary | ICD-10-CM

## 2023-02-01 IMAGING — CT CT RENAL STONE PROTOCOL
3 of 4 series · 10 of 46 positions shown, 15 images · non-contrast
Comparison: None.

CLINICAL DATA: Hematuria

EXAM:
CT ABDOMEN AND PELVIS WITHOUT CONTRAST
TECHNIQUE: Multidetector CT imaging of the abdomen and pelvis was performed
following the standard protocol without IV contrast.

[Series 4: lung bases · axial · 0.69mm/px · z∈[-527,-417]mm · 6 of 32 slices shown, 11 images]
[im 5/32  soft-tissue]
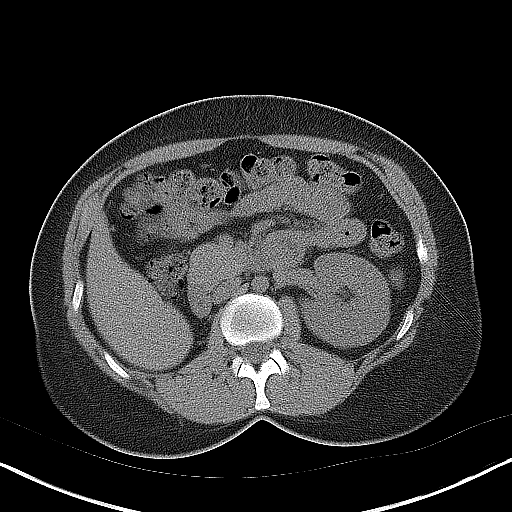
[im 5/32  bone]
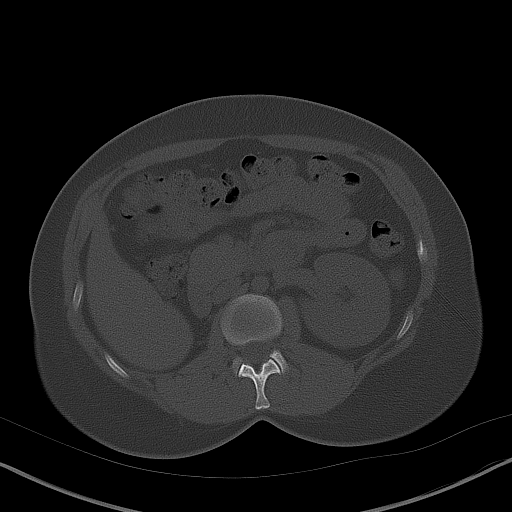
[im 9/32  soft-tissue]
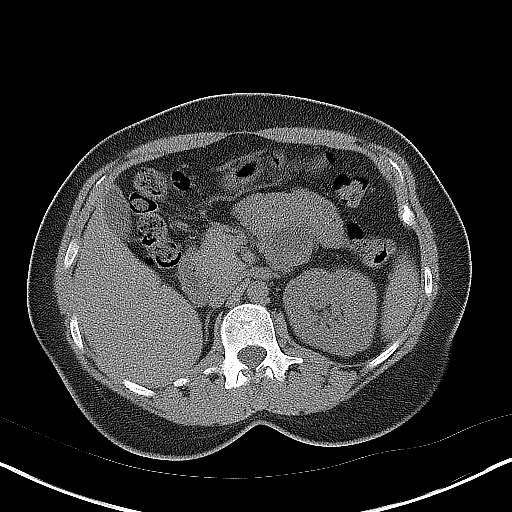
[im 14/32  soft-tissue]
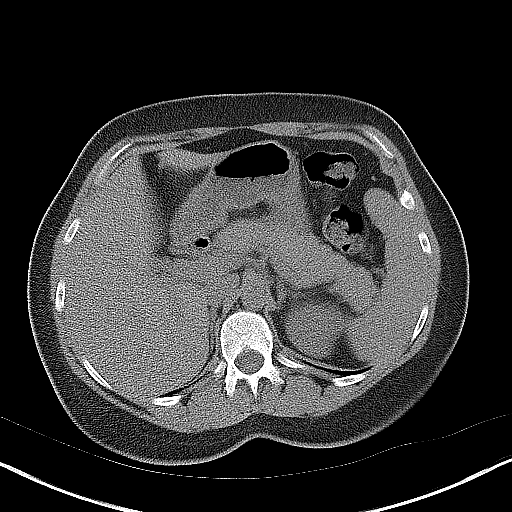
[im 14/32  lung]
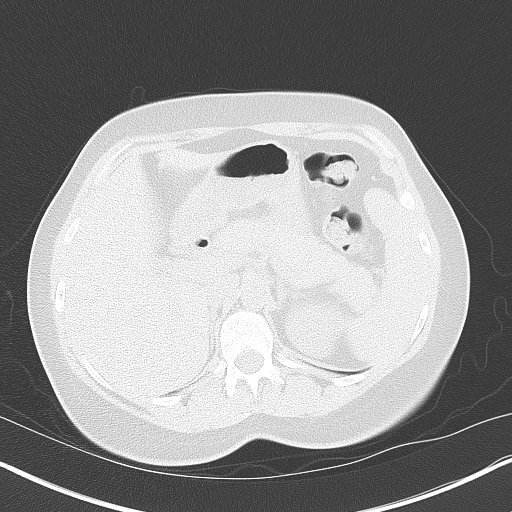
[im 18/32  soft-tissue]
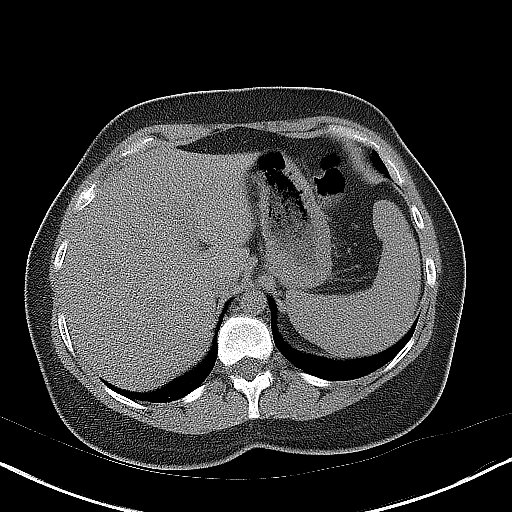
[im 18/32  lung]
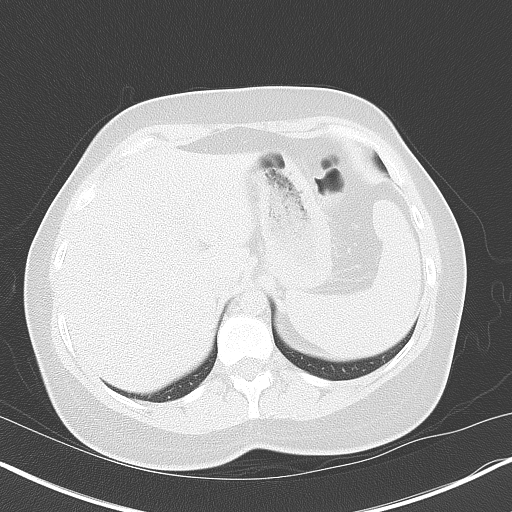
[im 23/32  soft-tissue]
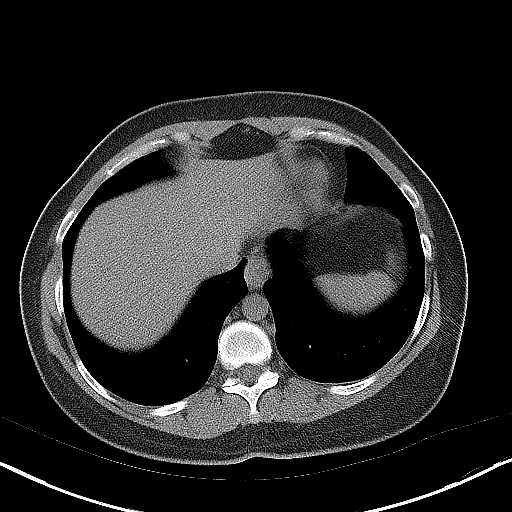
[im 23/32  lung]
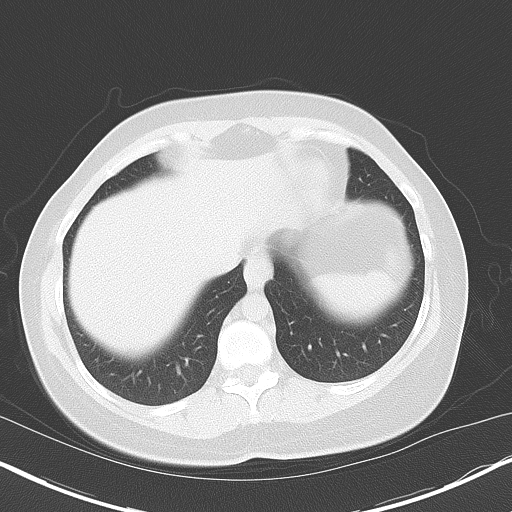
[im 27/32  soft-tissue]
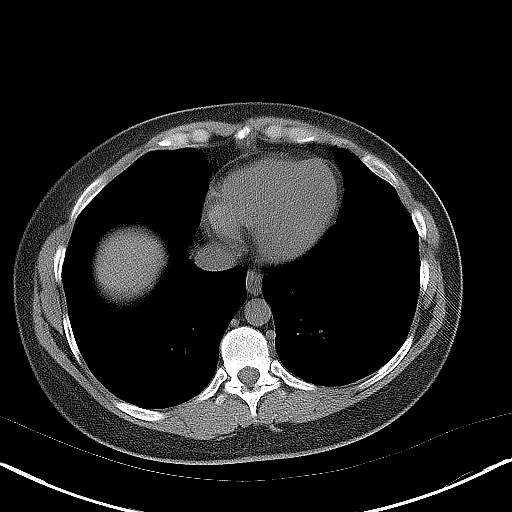
[im 27/32  lung]
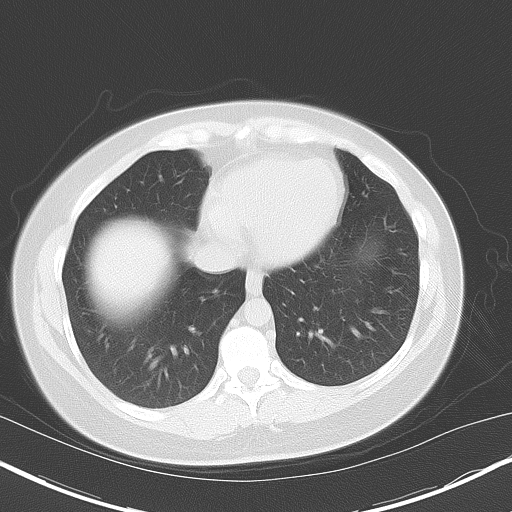

[Series 5: coronal · coronal · 0.70mm/px · 3 of 136 slices shown]
[im 46/136  soft-tissue]
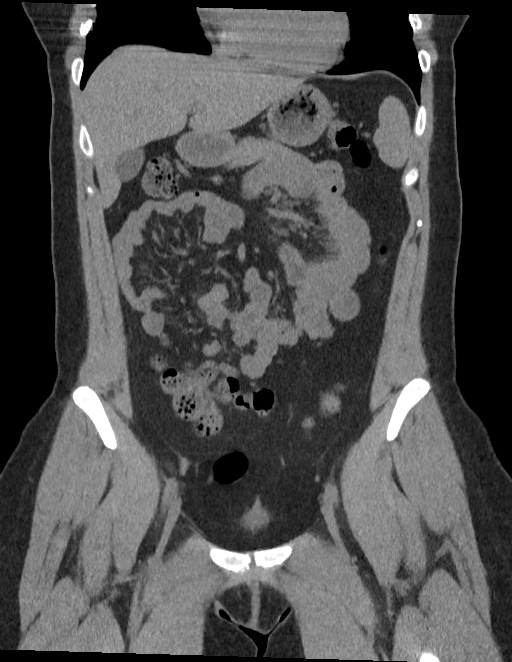
[im 61/136  soft-tissue]
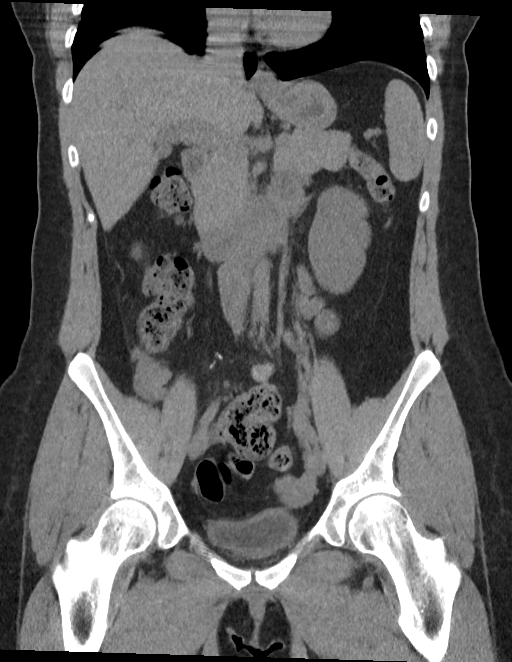
[im 76/136  soft-tissue]
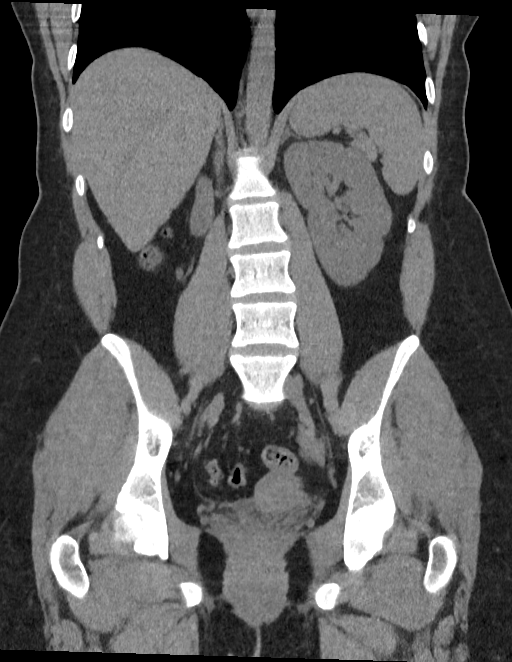

[Series 6: sagittal · sagittal · 0.53mm/px · 1 of 223 slices shown]
[im 75/223  soft-tissue]
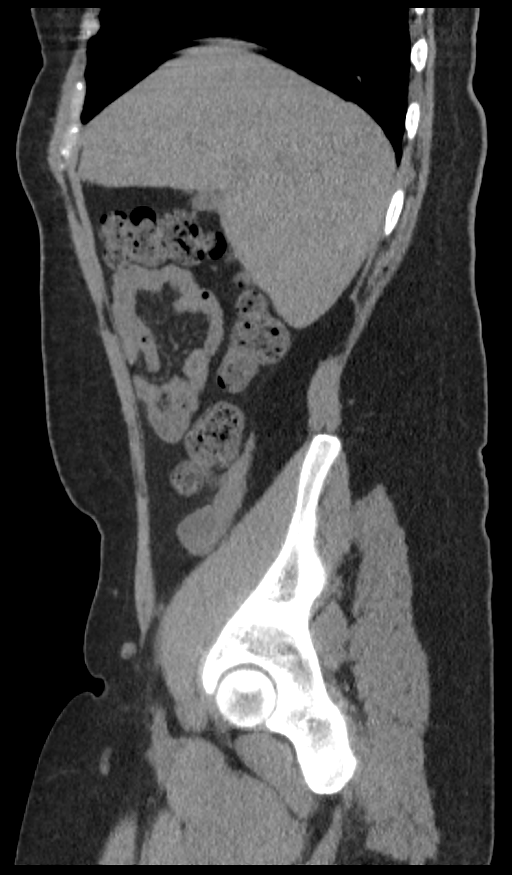

[10 of 46 positions shown; findings below may reference images not displayed]

FINDINGS: Lower chest: No acute abnormality.

Hepatobiliary: Unremarkable noncontrast appearance of the hepatic
parenchyma. Gallbladder is unremarkable. No biliary ductal dilation.

Pancreas: No pancreatic ductal dilation or evidence of acute
inflammation.

Spleen: Within normal limits.

Adrenals/Urinary Tract: Left adrenal glands unremarkable. Flattened
appearance of the right adrenal gland. There is no kidney located in
the right renal fossa. However in the right lower quadrant there is
a 2.7 x 2.5 x 4.0 cm soft tissue density on image 55/2 and 54/5
which appears to demonstrate a connection with the urinary bladder
but does not demonstrate normal renal architecture and is suspicious
for an atrophic pelvic kidney. The left kidney is unremarkable. No
renal, ureteral or bladder calculi visualized. Urinary bladder is
predominantly decompressed limiting evaluation.

Stomach/Bowel: No enteric contrast was administered. The stomach is
unremarkable for degree of distension. No pathologic dilation large
or small bowel. The appendix is not confidently identified however
there is no pericecal inflammation. No evidence of acute bowel
inflammation.

Vascular/Lymphatic: No abdominal aortic aneurysm. No pathologically
enlarged abdominal or pelvic lymph nodes.

Reproductive: Uterus and bilateral adnexa are unremarkable.

Other: Trace pelvic free fluid, within physiologic normal limits for
a reproductive age female.

Musculoskeletal: No acute or significant osseous findings.
IMPRESSION: 1. There is no kidney located in the right renal fossa. However in
the right lower quadrant there is a 2.7 x 2.5 x 4.0 cm soft tissue
density which appears to demonstrate a connection with the urinary
bladder but does not demonstrate normal renal architecture and is
suspicious for an atrophic pelvic kidney.
2. Left kidney is unremarkable.
3. No evidence of nephrolithiasis or obstructive uropathy.

## 2023-02-07 ENCOUNTER — Ambulatory Visit (INDEPENDENT_AMBULATORY_CARE_PROVIDER_SITE_OTHER): Payer: BC Managed Care – PPO | Admitting: Clinical

## 2023-02-07 DIAGNOSIS — F411 Generalized anxiety disorder: Secondary | ICD-10-CM

## 2023-02-07 DIAGNOSIS — F331 Major depressive disorder, recurrent, moderate: Secondary | ICD-10-CM

## 2023-02-07 NOTE — Progress Notes (Signed)
Virtual Visit via Video Note   I connected with Melinda Mills on 02/07/23 at  3:00 PM EDT by a video enabled telemedicine application and verified that I am speaking with the correct person using two identifiers.   Location: Patient: home Provider: office   I discussed the limitations of evaluation and management by telemedicine and the availability of in person appointments. The patient expressed understanding and agreed to proceed.     THERAPIST PROGRESS NOTE     Session Time: 3:00 PM-3:45 PM   Participation Level: Active   Behavioral Response: Casual and Alert,Depressed   Type of Therapy: Individual Therapy   Treatment Goals addressed: Depression and Anxiety   Interventions: CBT   Summary: Melinda Mills a 32 y.o. female who presents with  MDD/ GAD . The OPT therapist worked with the patient for her OPT treatment. The OPT therapist utilized Motivational Interviewing to assist in creating therapeutic repore. The patient in the session was engaged and work in collaboration giving feedback about her triggers and symptoms over the past few weeks. The patient spoke about learning definitively that she in fact is not pregnant and this taking stress off of her situation in her relationship as she and her partner live in different states. .The patient spoke about her experience through the last week including her interactions with family, with her Mother conflictual and her sister being more positive. The OPT therapist utilized Cognitive Behavioral Therapy through cognitive restructuring as well as worked with the patient on coping strategies to assist in management of mood and anxiety.The OPT therapist reviewed as well with the patient basic need areas examining the patients current eating habits, sleep schedule, exercise, and hygiene. The OPT therapist worked with the patient on refocus in areas of basic needs and health.    Suicidal/Homicidal: Nowithout intent/plan   Therapist Response:The  OPT therapist worked with the patient for the patients scheduled session. The patient was engaged in her session and gave feedback in relation to triggers, symptoms, and behavior responses over the past few weeks. The OPT therapist worked with the patient utilizing an in session Cognitive Behavioral Therapy exercise. The patient was responsive in the session and verbalized, " Well I found out for sure and I am not pregnant  ".The OPT therapist worked with the patient providing ongoing psycho-education. The OPT therapist worked with the patient evaluating her use of coping to balance her external stressors. The patient spoke about the impact of learning she is not pregnant. The patient noted ultimately due to her situation and timing she is relieved to learn she is not pregnant. The OPT therapist will work with the patient in her next scheduled session.     Plan: return in 2/3 weeks    Diagnosis:      Axis I: MDD/ GAD   Axis II: No diagnosis       Collaboration of Care: Overview of the patients involvement in the med therapy program with Dr. Elna Breslow.   Patient/Guardian was advised Release of Information must be obtained prior to any record release in order to collaborate their care with an outside provider. Patient/Guardian was advised if they have not already done so to contact the registration department to sign all necessary forms in order for [place] to release information regarding their care.    Consent: Patient/Guardian gives verbal consent for treatment and assignment of benefits for services provided during this visit. Patient/Guardian expressed understanding and agreed to proceed.    I discussed the assessment and treatment  plan with the patient. The patient was provided an opportunity to ask questions and all were answered. The patient agreed with the plan and demonstrated an understanding of the instructions.   The patient was advised to call back or seek an in-person evaluation if the symptoms  worsen or if the condition fails to improve as anticipated.   I provided 45 minutes of non-face-to-face time during this encounter.   [name], LCSW   02/07/2023

## 2023-03-06 ENCOUNTER — Other Ambulatory Visit: Payer: BC Managed Care – PPO | Attending: Oncology

## 2023-03-14 ENCOUNTER — Ambulatory Visit (INDEPENDENT_AMBULATORY_CARE_PROVIDER_SITE_OTHER): Payer: BC Managed Care – PPO | Admitting: Clinical

## 2023-03-14 DIAGNOSIS — F331 Major depressive disorder, recurrent, moderate: Secondary | ICD-10-CM

## 2023-03-14 DIAGNOSIS — F411 Generalized anxiety disorder: Secondary | ICD-10-CM | POA: Diagnosis not present

## 2023-03-14 NOTE — Progress Notes (Signed)
Virtual Visit via Video Note   I connected with Melinda Mills on 03/14/23 at  4:00 PM EDT by a video enabled telemedicine application and verified that I am speaking with the correct person using two identifiers.   Location: Patient: home Provider: office   I discussed the limitations of evaluation and management by telemedicine and the availability of in person appointments. The patient expressed understanding and agreed to proceed.     THERAPIST PROGRESS NOTE     Session Time: 4:00 PM-4:55 PM   Participation Level: Active   Behavioral Response: Casual and Alert,Depressed   Type of Therapy: Individual Therapy   Treatment Goals addressed: Depression and Anxiety   Interventions: CBT   Summary: Melinda Mills a 32 y.o. female who presents with  MDD/ GAD . The OPT therapist worked with the patient for her OPT treatment. The OPT therapist utilized Motivational Interviewing to assist in creating therapeutic repore. The patient in the session was engaged and work in collaboration giving feedback about her triggers and symptoms over the past few weeks. The patient spoke about her relationship as she and her partner live in different states. .The patient spoke about her experience through the last week including her interactions with family, partner, and at work. The OPT therapist utilized Cognitive Behavioral Therapy through cognitive restructuring as well as worked with the patient on coping strategies to assist in management of mood and anxiety.The OPT therapist reviewed as well with the patient basic need areas examining the patients current eating habits, sleep schedule, exercise, and hygiene. The OPT therapist worked with the patient on refocus in areas of basic needs and health.    Suicidal/Homicidal: Nowithout intent/plan   Therapist Response:The OPT therapist worked with the patient for the patients scheduled session. The patient was engaged in her session and gave feedback in relation  to triggers, symptoms, and behavior responses over the past few weeks. The OPT therapist worked with the patient utilizing an in session Cognitive Behavioral Therapy exercise. The patient was responsive in the session and verbalized, "He got permission (pt partner) from his probation officer to get a travel pass from [place] to [place] and was able to come down and visit me ".The OPT therapist worked with the patient providing ongoing psycho-education. The OPT therapist worked with the patient evaluating her use of coping to balance her external stressors. The patient spoke about the stress of logistics in her long distance relationship. The OPT therapist will work with the patient in her next scheduled session.     Plan: return in 2/3 weeks    Diagnosis:      Axis I: MDD/ GAD   Axis II: No diagnosis       Collaboration of Care: No additional collaboration for this session.   Patient/Guardian was advised Release of Information must be obtained prior to any record release in order to collaborate their care with an outside provider. Patient/Guardian was advised if they have not already done so to contact the registration department to sign all necessary forms in order for [place] to release information regarding their care.    Consent: Patient/Guardian gives verbal consent for treatment and assignment of benefits for services provided during this visit. Patient/Guardian expressed understanding and agreed to proceed.    I discussed the assessment and treatment plan with the patient. The patient was provided an opportunity to ask questions and all were answered. The patient agreed with the plan and demonstrated an understanding of the instructions.   The patient  was advised to call back or seek an in-person evaluation if the symptoms worsen or if the condition fails to improve as anticipated.   I provided 55 minutes of non-face-to-face time during this encounter.   [name], LCSW    03/14/2023

## 2023-03-22 NOTE — Telephone Encounter (Signed)
Called patient as instructed by provider to make patient aware that the provider  would not be able to write a emotional support letter due to [place] policy she voiced understanding and stated that she would contact her PCP

## 2023-04-18 ENCOUNTER — Ambulatory Visit (HOSPITAL_COMMUNITY): Payer: BC Managed Care – PPO | Admitting: Clinical

## 2023-04-18 DIAGNOSIS — F339 Major depressive disorder, recurrent, unspecified: Secondary | ICD-10-CM | POA: Diagnosis not present

## 2023-04-18 DIAGNOSIS — F411 Generalized anxiety disorder: Secondary | ICD-10-CM | POA: Diagnosis not present

## 2023-04-18 DIAGNOSIS — F331 Major depressive disorder, recurrent, moderate: Secondary | ICD-10-CM

## 2023-04-18 NOTE — Progress Notes (Signed)
Virtual Visit via Video Note   I connected with Melinda Mills on 04/18/23 at  3:00 PM EDT by a video enabled telemedicine application and verified that I am speaking with the correct person using two identifiers.   Location: Patient: home Provider: office   I discussed the limitations of evaluation and management by telemedicine and the availability of in person appointments. The patient expressed understanding and agreed to proceed.     THERAPIST PROGRESS NOTE     Session Time: 3:00 PM-3:55 PM   Participation Level: Active   Behavioral Response: Casual and Alert,Depressed   Type of Therapy: Individual Therapy   Treatment Goals addressed: Depression and Anxiety   Interventions: CBT   Summary: Melinda Mills a 32 y.o. female who presents with  MDD/ GAD . The OPT therapist worked with the patient for her OPT treatment. The OPT therapist utilized Motivational Interviewing to assist in creating therapeutic repore. The patient in the session was engaged and work in collaboration giving feedback about her triggers and symptoms over the past few weeks. The patient spoke about her relationship as she and her partner live in different states as well as the progression she hopes for in the relationship of incorporating her child with her partner and all 3 of them spending time together. The patient spoke about her experience through the last week including her interactions with family, partner, and at work . 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 The OPT therapist utilized Cognitive Behavioral Therapy through cognitive restructuring as well as worked with the patient on coping strategies to assist in management of mood and anxiety.The OPT therapist reviewed as well with the patient basic need areas examining the patients current eating habits, sleep schedule, exercise, and hygiene. The OPT  therapist worked with the patient on focus in areas of basic needs and health. The patient spoke about her plans and celebrating Thanksgiving holiday.   Suicidal/Homicidal: Nowithout intent/plan   Therapist Response:The OPT therapist worked with the patient for the patients scheduled session. The patient was engaged in her session and gave feedback in relation to triggers, symptoms, and behavior responses over the past few weeks. The OPT therapist worked with the patient utilizing an in session Cognitive Behavioral Therapy exercise. The patient was responsive in the session and verbalized, " Now my ex husband has started seeing someone new and they are wanting permission to allow our son to be involved with his girlfriend which opens the door for me to request the same thing and allow our son to start to be involved with my boyfriend.The OPT therapist worked with the patient providing ongoing psycho-education overviewing adjustment to potential upcoming changes and transitions. The OPT therapist worked with the patient evaluating her use of coping to balance her external stressors. The patient spoke about the stress of logistics in her long distance relationship. The OPT therapist will work with the patient in her next scheduled session.     Plan: return in 2/3 weeks    Diagnosis:      Axis I: MDD/ GAD   Axis II: No diagnosis       Collaboration of Care: No additional collaboration for this session.   Patient/Guardian was advised Release of Information must be obtained prior to any record release in order to collaborate their care with an outside provider. Patient/Guardian was advised if they have not already done  so to contact the registration department to sign all necessary forms in order for [place] to release information regarding their care.    Consent: Patient/Guardian gives verbal consent for treatment and assignment of benefits for services provided during this visit. Patient/Guardian expressed  understanding and agreed to proceed.    I discussed the assessment and treatment plan with the patient. The patient was provided an opportunity to ask questions and all were answered. The patient agreed with the plan and demonstrated an understanding of the instructions.   The patient was advised to call back or seek an in-person evaluation if the symptoms worsen or if the condition fails to improve as anticipated.   I provided 55 minutes of non-face-to-face time during this encounter.   [name], LCSW   04/18/2023

## 2023-05-24 ENCOUNTER — Ambulatory Visit (HOSPITAL_COMMUNITY): Payer: BC Managed Care – PPO | Admitting: Clinical

## 2023-05-24 DIAGNOSIS — F331 Major depressive disorder, recurrent, moderate: Secondary | ICD-10-CM | POA: Diagnosis not present

## 2023-05-24 DIAGNOSIS — F411 Generalized anxiety disorder: Secondary | ICD-10-CM

## 2023-05-24 NOTE — Progress Notes (Signed)
 Virtual Visit via Telephone Note ( Video unavalible due to local power outage at patients home)_  I connected with Melinda Mills on 05/24/23 at  2:00 PM EST by telephone and verified that I am speaking with the correct person using two identifiers.  Location: Patient: Home Provider: Office   I discussed the limitations, risks, security and privacy concerns of performing an evaluation and management service by telephone and the availability of in person appointments. I also discussed with the patient that there may be a patient responsible charge related to this service. The patient expressed understanding and agreed to proceed.     THERAPIST PROGRESS NOTE     Session Time: 2:00 PM-2:45 PM   Participation Level: Active   Behavioral Response: Casual and Alert,Depressed   Type of Therapy: Individual Therapy   Treatment Goals addressed: Depression and Anxiety   Interventions: CBT   Summary: Melinda Mills a 32 y.o. female who presents with  MDD/ GAD . The OPT therapist worked with the patient for her OPT treatment. The OPT therapist utilized Motivational Interviewing to assist in creating therapeutic repore. The patient in the session was engaged and work in collaboration giving feedback about her triggers and symptoms over the past few weeks. The patient spoke about her relationship as she and her partner live in different states as well as the progression she hopes for in the relationship of incorporating her child with her partner and all 3 of them spending time together being complicated by the patients ex who she shares custody of her child with not wanting the child around her boyfriend. The patient spoke about her experience through the Christmas holidays including her interactions with family and partner . 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 The OPT therapist  utilized Cognitive Behavioral Therapy through cognitive restructuring as well as worked with the patient on coping strategies to assist in management of mood and anxiety.The OPT therapist reviewed as well with the patient basic need areas examining the patients current eating habits, sleep schedule, exercise, and hygiene. The patient spoke about her plans and celebrating New Years holiday.   Suicidal/Homicidal: Nowithout intent/plan   Therapist Response:The OPT therapist worked with the patient for the patients scheduled session. The patient was engaged in her session and gave feedback in relation to triggers, symptoms, and behavior responses over the past few weeks. The OPT therapist worked with the patient utilizing an in session Cognitive Behavioral Therapy exercise. The patient was responsive in the session and verbalized,  I am in staying with my boyfriend over [place] Years and we are just going to be staying at home watching tv.The OPT therapist worked with the patient providing ongoing psycho-education overviewing adjustment to potential upcoming changes and transitions as the patient moves into the 2025 year. The OPT therapist worked with the patient evaluating her use of coping to balance her external stressors. The patient spoke about the stress of logistics in her long distance relationship and concern her ex-husband may take her to court for child custody. The OPT therapist will work with the patient in her next scheduled session.     Plan: return in 2/3 weeks    Diagnosis:      Axis I: MDD/ GAD   Axis II: No diagnosis       Collaboration of Care: No additional collaboration for this session.   Patient/Guardian  was advised Release of Information must be obtained prior to any record release in order to collaborate their care with an outside provider. Patient/Guardian was advised if they have not already done so to contact the registration department to sign all necessary forms in order for  us  to release information regarding their care.    Consent: Patient/Guardian gives verbal consent for treatment and assignment of benefits for services provided during this visit. Patient/Guardian expressed understanding and agreed to proceed.    I discussed the assessment and treatment plan with the patient. The patient was provided an opportunity to ask questions and all were answered. The patient agreed with the plan and demonstrated an understanding of the instructions.   The patient was advised to call back or seek an in-person evaluation if the symptoms worsen or if the condition fails to improve as anticipated.   I provided 45 minutes of non-face-to-face time during this encounter.   Jerel ONEIDA Pepper, LCSW   05/24/2023

## 2023-10-05 ENCOUNTER — Other Ambulatory Visit: Payer: Self-pay | Admitting: Neurology

## 2023-10-06 MED ORDER — LAMOTRIGINE 100 MG PO TABS: 100.0000 mg | ORAL_TABLET | Freq: Two times a day (BID) | ORAL | 3 refills | Status: DC

## 2023-10-11 ENCOUNTER — Ambulatory Visit: Payer: BC Managed Care – PPO | Admitting: Neurology

## 2023-10-11 ENCOUNTER — Encounter: Payer: Self-pay | Admitting: Neurology

## 2023-10-11 VITALS — BP 133/82 | HR 86 | Ht 63.0 in | Wt 154.6 lb

## 2023-10-11 DIAGNOSIS — R569 Unspecified convulsions: Secondary | ICD-10-CM

## 2023-10-11 DIAGNOSIS — F4323 Adjustment disorder with mixed anxiety and depressed mood: Secondary | ICD-10-CM | POA: Diagnosis not present

## 2023-10-11 DIAGNOSIS — G40909 Epilepsy, unspecified, not intractable, without status epilepticus: Secondary | ICD-10-CM

## 2023-10-11 MED ORDER — LAMOTRIGINE ER 200 MG PO TB24: 200.0000 mg | ORAL_TABLET | Freq: Every day | ORAL | 3 refills | Status: DC

## 2023-10-11 MED ORDER — FOLIC ACID 1 MG PO TABS: 1.0000 mg | ORAL_TABLET | Freq: Every day | ORAL | 11 refills | Status: DC

## 2023-10-11 NOTE — Progress Notes (Signed)
 [facility]  PATIENT: Melinda Mills DOB: July 09, 1990  REQUESTING CLINICIAN: Aneita Baptise, PA HISTORY FROM: Patient  REASON FOR VISIT: Seizure  HISTORICAL  CHIEF COMPLAINT:  Chief Complaint  Patient presents with   Seizures    Rm16, alone, Sz: last episode a few months ago and does admit to medication non compliance    Update 10/11/2023 SS: On Lamictal  100 mg twice daily. Reports seizure few months ago, had missed her medication for a few days, described seizure as, feeling her body was "light", her head was also light, will have twitches in her extremities, a few at a time, she is zoned out, isn't aware. Afterwards is confused, doesn't remember a few hours before. Is good during school year with medication compliance but off season with school alters her medication regimen. 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 Has 33 year old son. Is not planning pregnancy. Is not on birth control.   INTERVAL HISTORY 10/05/2022:  Melinda Mills presents today for follow-up, last visit was in November and since then she has been doing well, she was recommended to continue with lamotrigine  100 mg twice daily but again she has been taking the medication daily. Denies any seizure or seizure-like activity.  She does state a couple times she had presyncopal episodes but actually did not fall.  Otherwise she is doing very well, she is compliant with the medication, even though she is taking it once a day instead of twice daily, denies any side effect.  No seizure or seizure activity, no concerns, or questions.   INTERVAL HISTORY 04/06/2022:  Patient presents today for follow-up, last visit was October 12, at that time plan was to switch the Keppra  to lamotrigine  because of side effect and she reports switching the medication and increasing the lamotrigine  to 100 mg twice daily.  Reports that  her mood is good, she discontinued Keppra ,  denies any seizure or seizure-like activity.  Overall she is doing very well. No complaints, no concerns.   HISTORY OF PRESENT ILLNESS:  This is a 33 year old woman past medical history of major depressive disorder, anxiety disorder who is presenting to establish care for seizure.  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  At that time, she was told that her seizures were induced by stress.  She described them as fainting episodes, sometime associated with twitching.  She was told during her seizures, her breathing gets shallow.  She reports prior to these events sometimes she will feel like almost having a panic attack, that she feels lightheaded then she passes out.  She did have a EEG in 2016 which showed epileptogenic potential, therefore she was started on Keppra .  She has been on Keppra  for 5 years then discontinued.  She reported back in August she was admitted at  behavioral health and Keppra  was restarted.  Since then she report being more anxious and depressed.  She reported her last seizure was 3 days ago and she was very upset on the phone with he friends, then she passed out. Denies any injuries with these seizures.    Handedness: Right   Onset: 2013  Seizure Type: Syncope, sometimes feels lightheaded, feels like she is going to have a panic attack   Current frequency: Last episode 3 days ago, prior to that was a few months ago   Any injuries from seizures: Hit head  Seizure risk factors:  None reported   Previous ASMs: Levetiracetam , Lamotrigine    Currenty ASMs: Lamotrigine  100 mg BID  ASMs side effects: Drowsiness  Brain Images: Normal 2016  Previous EEGs: paroxysmal bursts of semi-rhythmic delta activity intermixed with sharp components  OTHER MEDICAL CONDITIONS: Anxiety. Depression   REVIEW OF SYSTEMS: Full 14 system review of systems performed and negative with exception of: As noted in the HPI     ALLERGIES: Allergies  Allergen Reactions   Guaifenesin Dermatitis   Guanfacine  Rash and Dermatitis    HOME MEDICATIONS: Outpatient Medications Prior to Visit  Medication Sig Dispense Refill   amphetamine -dextroamphetamine  (ADDERALL XR) 25 MG 24 hr capsule Take by mouth every morning.     hydrOXYzine  (ATARAX ) 25 MG tablet TAKE 1/2-1 TABLETS (12.5-25 MG TOTAL) BY MOUTH 2 (TWO) TIMES DAILY AS NEEDED. 30 tablet 0   lamoTRIgine  (LAMICTAL ) 100 MG tablet Take 1 tablet (100 mg total) by mouth 2 (two) times daily. 180 tablet 3   traZODone  (DESYREL ) 50 MG tablet Take 1 tablet (50 mg total) by mouth at bedtime as needed. for sleep 90 tablet 0   ARIPiprazole  (ABILIFY ) 2 MG tablet Take 1 tablet (2 mg total) by mouth daily for 14 days. 14 tablet 0   escitalopram  (LEXAPRO ) 10 MG tablet Take 1 tablet (10 mg total) by mouth daily. 90 tablet 0   MYDAYIS  25 MG CP24 Take 1 capsule by mouth daily.     No facility-administered medications prior to visit.    PAST MEDICAL HISTORY: Past Medical History:  Diagnosis Date   ADHD (attention deficit hyperactivity disorder)    Depression    Seizures (HCC)     PAST SURGICAL HISTORY: Past Surgical History:  Procedure Laterality Date   APPENDECTOMY  2008/2009    FAMILY HISTORY: Family History  Adopted: Yes    SOCIAL HISTORY: Social History   Socioeconomic History   Marital status: Legally Separated    Spouse name: Not on file   Number of children: 1   Years of education: Not on file   Highest education level: Bachelor's degree (e.g., BA, AB, BS)  Occupational History   Not on file  Tobacco Use   Smoking status: Never   Smokeless tobacco: Never  Vaping Use   Vaping status: Never Used  Substance and Sexual Activity   Alcohol use: Not Currently    Comment: A few drinks per month   Drug use: Not Currently    Types: Marijuana   Sexual activity: Yes    Partners: Female    Birth control/protection: Condom  Other Topics Concern   Not on  file  Social History Narrative   Right handed    Caffeine 3 cups per day    Social Drivers of Health   Financial Resource Strain: Low Risk  (08/15/2018)   Overall Financial Resource Strain (CARDIA)    Difficulty of Paying Living Expenses: Not very hard  Food Insecurity: No Food Insecurity (02/09/2018)   Hunger Vital Sign    Worried About Running Out of Food in the Last Year: Never true    Ran Out of Food in the Last Year: Never true  Transportation Needs: No Transportation Needs (02/09/2018)   PRAPARE - Administrator, Civil Service (Medical): No    Lack of Transportation (Non-Medical): No  Physical Activity: Inactive (02/09/2018)   Exercise Vital Sign    Days of Exercise per Week: 0 days    Minutes of Exercise per Session: 0 min  Stress: No Stress Concern Present (02/09/2018)  [employer] of Occupational Health - Occupational Stress Questionnaire    Feeling of Stress : Not at all  Social Connections: Unknown (08/15/2018)   Social Connection and Isolation Panel [NHANES]    Frequency of Communication with Friends and Family: Not on file    Frequency of Social Gatherings with Friends and Family: Not on file    Attends Religious Services: Not on file    Active Member of Clubs or Organizations: Not on file    Attends Banker Meetings: Not on file    Marital Status: Living with partner  Intimate Partner Violence: Not At Risk (08/15/2018)   Humiliation, Afraid, Rape, and Kick questionnaire    Fear of Current or Ex-Partner: No    Emotionally Abused: No    Physically Abused: No    Sexually Abused: No   Physical Exam  General: The patient is alert and cooperative at the time of the examination.  Skin: No significant peripheral edema is noted.  Neurologic Exam  Mental status: The patient is alert and oriented x 3 at the time of the examination. The patient has apparent normal recent and remote memory, with an apparently normal attention span and  concentration ability.  Cranial nerves: Facial symmetry is present. Speech is normal, no aphasia or dysarthria is noted. Extraocular movements are full. Visual fields are full.  Motor: The patient has good strength in all 4 extremities.  Sensory examination: Soft touch sensation is symmetric on the face, arms, and legs.  Coordination: The patient has good finger-nose-finger and heel-to-shin bilaterally.  Gait and station: The patient has a normal gait.   Reflexes: Deep tendon reflexes are symmetric.  DIAGNOSTIC DATA (LABS, IMAGING, TESTING) - I reviewed patient records, labs, notes, testing and imaging myself where available.  Lab Results  Component Value Date   WBC 8.2 12/16/2021   HGB 14.7 12/16/2021   HCT 44.7 12/16/2021   MCV 90.5 12/16/2021   PLT 225 12/16/2021      Component Value Date/Time   NA 141 04/06/2022 1034   K 4.2 04/06/2022 1034   CL 102 04/06/2022 1034   CO2 24 04/06/2022 1034   GLUCOSE 78 04/06/2022 1034   GLUCOSE 92 12/16/2021 0639   BUN 14 04/06/2022 1034   CREATININE 0.92 04/06/2022 1034   CALCIUM  9.8 04/06/2022 1034   PROT 7.3 12/16/2021 0639   ALBUMIN 4.1 12/16/2021 0639   AST 18 12/16/2021 0639   ALT 17 12/16/2021 0639   ALKPHOS 70 12/16/2021 0639   BILITOT 0.6 12/16/2021 0639   GFRNONAA >60 12/16/2021 0639   GFRAA >60 02/10/2018 0527   Lab Results  Component Value Date   CHOL 198 12/16/2021   HDL 63 12/16/2021   LDLCALC 123 (H) 12/16/2021   TRIG 61 12/16/2021   Lab Results  Component Value Date   HGBA1C 4.8 12/16/2021   No results found for: "VITAMINB12" Lab Results  Component Value Date   TSH 3.132 12/16/2021    EEG 2016 Abnormal EEG demonstrating a few paroxysmal bursts of semi-rhythmic delta activity intermixed with sharp components.  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  Clinical correlate is required  Brain MRI 2016 Unremarkable MRI of the brain with and without gadolinium  enhancement.  EEG 03/17/22: Normal   ASSESSMENT AND PLAN  1.  Seizure disorder 2.  History of anxiety and depression  - Describes a seizure event several months ago (>6) , described as generalized twitching, with loss of awareness.  Had missed  several days of her Lamictal  100 mg twice daily  - Switch to Lamictal  XR 200 mg at bedtime to promote compliance.  Discussed the importance of daily compliance, not to miss any medication - Check labs today - Order folic acid 1 mg daily since female childbearing age, not on birth control in a relationship  - Check EEG - Reviewed [place]  driving laws no driving until seizure-free for 6 months - Call for seizure activity - Follow-up with me in 6 months or sooner if needed  Meds ordered this encounter  Medications   LamoTRIgine  200 MG TB24 24 hour tablet    Sig: Take 1 tablet (200 mg total) by mouth at bedtime.    Dispense:  90 tablet    Refill:  3   folic acid (FOLVITE) 1 MG tablet    Sig: Take 1 tablet (1 mg total) by mouth daily.    Dispense:  30 tablet    Refill:  11   Orders Placed This Encounter  Procedures   CBC with Differential/Platelet   CMP   Lamotrigine  level   EEG adult   Jeanmarie Millet, Maritza Sidles, DNP  [facility] [address] [place], [place] [address] [phone]

## 2023-10-11 NOTE — Patient Instructions (Addendum)
 Switch to Lamictal  XR 200 mg daily for seizure prevention Start Folic acid 1 mg daily  Do not miss any doses of medication  Check labs today  Check EEG Call for seizure activity  [place] driving law is no driving until seizure free 6 months

## 2023-10-12 LAB — COMPREHENSIVE METABOLIC PANEL WITH GFR
ALT: 11 IU/L (ref 0–32)
AST: 15 IU/L (ref 0–40)
Albumin: 4.7 g/dL (ref 3.9–4.9)
Alkaline Phosphatase: 100 IU/L (ref 44–121)
BUN/Creatinine Ratio: 11 (ref 9–23)
BUN: 9 mg/dL (ref 6–20)
Bilirubin Total: <0.2 mg/dL (ref 0.0–1.2)
CO2: 23 mmol/L (ref 20–29)
Calcium: 9.1 mg/dL (ref 8.7–10.2)
Chloride: 105 mmol/L (ref 96–106)
Creatinine, Ser: 0.83 mg/dL (ref 0.57–1.00)
Globulin, Total: 2 g/dL (ref 1.5–4.5)
Glucose: 87 mg/dL (ref 70–99)
Potassium: 4.1 mmol/L (ref 3.5–5.2)
Sodium: 143 mmol/L (ref 134–144)
Total Protein: 6.7 g/dL (ref 6.0–8.5)
eGFR: 97 mL/min/{1.73_m2} (ref >59)

## 2023-10-12 LAB — CBC WITH DIFFERENTIAL/PLATELET
Basophils Absolute: 0.1 10*3/uL (ref 0.0–0.2)
Basos: 1 %
EOS (ABSOLUTE): 0.3 10*3/uL (ref 0.0–0.4)
Eos: 4 %
Hematocrit: 43.6 % (ref 34.0–46.6)
Hemoglobin: 14.2 g/dL (ref 11.1–15.9)
Immature Grans (Abs): 0 10*3/uL (ref 0.0–0.1)
Immature Granulocytes: 0 %
Lymphocytes Absolute: 2 10*3/uL (ref 0.7–3.1)
Lymphs: 27 %
MCH: 30.1 pg (ref 26.6–33.0)
MCHC: 32.6 g/dL (ref 31.5–35.7)
MCV: 93 fL (ref 79–97)
Monocytes Absolute: 0.5 10*3/uL (ref 0.1–0.9)
Monocytes: 6 %
Neutrophils Absolute: 4.7 10*3/uL (ref 1.4–7.0)
Neutrophils: 62 %
Platelets: 259 10*3/uL (ref 150–450)
RBC: 4.71 x10E6/uL (ref 3.77–5.28)
RDW: 12.3 % (ref 11.7–15.4)
WBC: 7.6 10*3/uL (ref 3.4–10.8)

## 2023-10-12 LAB — LAMOTRIGINE LEVEL: Lamotrigine Lvl: 2.7 ug/mL (ref 2.0–20.0)

## 2023-10-13 ENCOUNTER — Ambulatory Visit: Payer: Self-pay | Admitting: Neurology

## 2023-10-27 ENCOUNTER — Ambulatory Visit: Admitting: Neurology

## 2023-10-27 DIAGNOSIS — G40909 Epilepsy, unspecified, not intractable, without status epilepticus: Secondary | ICD-10-CM

## 2023-10-27 NOTE — Procedures (Signed)
   History:  33 year old woman with seizure disorder   EEG classification:  Awake and asleep  Duration:  27 minutes   Technical aspects: This EEG study was done with scalp electrodes positioned according to the 10-20 International system of electrode placement. Electrical activity was reviewed with band pass filter of 1-70Hz , sensitivity of 7 uV/mm, display speed of 50mm/sec with a 60Hz  notched filter applied as appropriate. EEG data were recorded continuously and digitally stored.   Description of the recording: The background rhythms of this recording consists of a fairly well modulated medium amplitude background activity of 9 Hz. As the record progresses, the patient initially is in the waking state, but appears to enter the early stage II sleep during the recording, with rudimentary sleep spindles and vertex sharp wave activity seen. During the wakeful state, photic stimulation was performed, and no abnormal responses were seen. Hyperventilation was also performed, no abnormal response seen. No epileptiform discharges seen during this recording. There was no focal slowing.   Abnormality: None   Impression: This is a normal awake and sleep EEG. No evidence of interictal epileptiform discharges. Normal EEGs, however, do not rule out epilepsy.    Shiro Ellerman, MD [facility]

## 2024-01-05 LAB — OB RESULTS CONSOLE RUBELLA ANTIBODY, IGM: Rubella: IMMUNE

## 2024-01-05 LAB — OB RESULTS CONSOLE HEPATITIS B SURFACE ANTIGEN: Hepatitis B Surface Ag: NEGATIVE

## 2024-01-05 LAB — OB RESULTS CONSOLE GC/CHLAMYDIA
Chlamydia: NEGATIVE
Neisseria Gonorrhea: NEGATIVE

## 2024-01-05 LAB — OB RESULTS CONSOLE VARICELLA ZOSTER ANTIBODY, IGG: Varicella: NON-IMMUNE/NOT IMMUNE

## 2024-01-05 LAB — OB RESULTS CONSOLE RPR: RPR: NONREACTIVE

## 2024-03-02 ENCOUNTER — Other Ambulatory Visit: Payer: Self-pay | Admitting: Genetic Counselor

## 2024-03-02 DIAGNOSIS — Z006 Encounter for examination for normal comparison and control in clinical research program: Secondary | ICD-10-CM

## 2024-04-25 LAB — OB RESULTS CONSOLE HIV ANTIBODY (ROUTINE TESTING): HIV: NONREACTIVE

## 2024-05-18 ENCOUNTER — Other Ambulatory Visit: Payer: Self-pay | Admitting: Neurology

## 2024-05-18 ENCOUNTER — Telehealth: Payer: Self-pay | Admitting: Neurology

## 2024-05-18 DIAGNOSIS — G40909 Epilepsy, unspecified, not intractable, without status epilepticus: Secondary | ICD-10-CM

## 2024-05-18 MED ORDER — LAMOTRIGINE ER 200 MG PO TB24: 200.0000 mg | ORAL_TABLET | Freq: Two times a day (BID) | ORAL | 0 refills | Status: DC

## 2024-05-18 NOTE — Telephone Encounter (Signed)
 [redacted] weeks pregnant, last lamotrigine  level was undetectable. Will increase to 200 mg BID and patient will stop by the office next week for lab work.

## 2024-05-18 NOTE — Progress Notes (Signed)
 Patient is [redacted] weeks pregnant, last lamotrigine  undetectable. Will increase dose to 200 mg BID  Dr. Gregg

## 2024-05-24 NOTE — L&D Delivery Note (Signed)
 Delivery Note  Date of delivery: 05/29/2024 Estimated Date of Delivery: 07/28/24 Patient's last menstrual period was 10/11/2023 (approximate). EGA: [redacted]w[redacted]d  Delivery Note At 10:19 AM a non-viable female was delivered via Vaginal, Spontaneous (Presentation: Left Occiput Anterior).  APGAR: n/a; weight pending. Placenta status: Spontaneous, Intact.  Cord: 3 vessels with the following complications: None.  Cord pH: n/a  First Stage: Labor onset: unknown Induction : Cytotec  and Pitocin  Analgesia /Anesthesia intrapartum: Epidural AROM after delivery - baby born en caul with placenta  Melinda Mills presented to L&D with an IUFD for IOL. She was induced with cytotec  and pitocin . Epidural placed for pain relief.   Second Stage: Complete dilation at 1017 Onset of pushing at 1017 FHR second stage n/a Delivery at 1019 on 05/29/2024  She progressed to complete and had a spontaneous vaginal birth of a non-viable female over an intact perineum. The baby was born quickly en caul with the placenta attached and completely intact. Tight double nuchal cord was noted along with the cord also tightly wrapped around one of the baby's leg. Baby was placed in a baby blanket in the bassinet per mother's request.  Cord clamped and cut.   Third Stage: Placenta delivered with the baby intact with 3VC at 1019 Placenta disposition: Pathology / Microarray ordered and will be sent Uterine tone firm / bleeding min IV pitocin  given for hemorrhage prophylaxis  Anesthesia: Epidural Episiotomy: None Lacerations: None Suture Repair: none Est. Blood Loss (mL): 50  Complications: none  Mom to remain in L&D until discharge.  Baby to stay with family as long as they would like.   Margery FORBES Coe, CNM 05/29/2024 10:41 AM

## 2024-05-27 ENCOUNTER — Observation Stay
Admission: EM | Admit: 2024-05-27 | Discharge: 2024-05-28 | Disposition: A | Discharge status: Home / Self Care | Payer: Self-pay | Source: Home / Self Care | Admitting: Obstetrics and Gynecology

## 2024-05-27 ENCOUNTER — Encounter: Payer: Self-pay | Admitting: *Deleted

## 2024-05-27 DIAGNOSIS — F32A Depression, unspecified: Secondary | ICD-10-CM | POA: Insufficient documentation

## 2024-05-27 DIAGNOSIS — O368190 Decreased fetal movements, unspecified trimester, not applicable or unspecified: Secondary | ICD-10-CM | POA: Diagnosis present

## 2024-05-27 DIAGNOSIS — Q51818 Other congenital malformations of uterus: Secondary | ICD-10-CM | POA: Insufficient documentation

## 2024-05-27 DIAGNOSIS — G40909 Epilepsy, unspecified, not intractable, without status epilepticus: Secondary | ICD-10-CM | POA: Insufficient documentation

## 2024-05-27 DIAGNOSIS — F329 Major depressive disorder, single episode, unspecified: Secondary | ICD-10-CM | POA: Diagnosis present

## 2024-05-27 DIAGNOSIS — F322 Major depressive disorder, single episode, severe without psychotic features: Secondary | ICD-10-CM | POA: Diagnosis present

## 2024-05-27 DIAGNOSIS — B019 Varicella without complication: Secondary | ICD-10-CM | POA: Insufficient documentation

## 2024-05-27 DIAGNOSIS — Z3A31 31 weeks gestation of pregnancy: Secondary | ICD-10-CM | POA: Insufficient documentation

## 2024-05-27 DIAGNOSIS — O364XX0 Maternal care for intrauterine death, not applicable or unspecified: Secondary | ICD-10-CM | POA: Insufficient documentation

## 2024-05-27 DIAGNOSIS — Z820 Family history of epilepsy and other diseases of the nervous system: Secondary | ICD-10-CM | POA: Insufficient documentation

## 2024-05-27 DIAGNOSIS — Q600 Renal agenesis, unilateral: Secondary | ICD-10-CM

## 2024-05-27 DIAGNOSIS — Z90721 Acquired absence of ovaries, unilateral: Secondary | ICD-10-CM

## 2024-05-27 DIAGNOSIS — Z87898 Personal history of other specified conditions: Secondary | ICD-10-CM

## 2024-05-27 DIAGNOSIS — Q270 Congenital absence and hypoplasia of umbilical artery: Secondary | ICD-10-CM | POA: Insufficient documentation

## 2024-05-27 DIAGNOSIS — O99343 Other mental disorders complicating pregnancy, third trimester: Secondary | ICD-10-CM | POA: Insufficient documentation

## 2024-05-27 DIAGNOSIS — O99353 Diseases of the nervous system complicating pregnancy, third trimester: Secondary | ICD-10-CM | POA: Insufficient documentation

## 2024-05-27 DIAGNOSIS — F411 Generalized anxiety disorder: Secondary | ICD-10-CM | POA: Diagnosis present

## 2024-05-27 MED ORDER — ACETAMINOPHEN 325 MG PO TABS: 650.0000 mg | ORAL_TABLET | ORAL | Status: DC | PRN

## 2024-05-27 MED ORDER — CALCIUM CARBONATE ANTACID 500 MG PO CHEW: 2.0000 | CHEWABLE_TABLET | ORAL | Status: DC | PRN

## 2024-05-27 NOTE — OB Triage Note (Signed)
 Pt is G2P1 at approx 31 weeks with c/o decreases FM since 6pm. Can't recall him moving much throughout the day and generally he is very active.

## 2024-05-27 NOTE — OB Triage Note (Signed)
 Unable to hear FHR with external monior or doppler. Pt turned to Right and left side.CNM notified

## 2024-05-28 ENCOUNTER — Other Ambulatory Visit: Payer: Self-pay

## 2024-05-28 ENCOUNTER — Inpatient Hospital Stay
Admission: EM | Admit: 2024-05-28 | Discharge: 2024-05-29 | DRG: 806 | Disposition: A | Discharge status: Home / Self Care | Attending: Certified Nurse Midwife | Admitting: Certified Nurse Midwife

## 2024-05-28 ENCOUNTER — Observation Stay: Payer: Self-pay

## 2024-05-28 ENCOUNTER — Encounter: Payer: Self-pay | Admitting: Obstetrics and Gynecology

## 2024-05-28 DIAGNOSIS — F419 Anxiety disorder, unspecified: Secondary | ICD-10-CM | POA: Diagnosis present

## 2024-05-28 DIAGNOSIS — Z3A31 31 weeks gestation of pregnancy: Secondary | ICD-10-CM

## 2024-05-28 DIAGNOSIS — O364XX0 Maternal care for intrauterine death, not applicable or unspecified: Principal | ICD-10-CM

## 2024-05-28 DIAGNOSIS — O99354 Diseases of the nervous system complicating childbirth: Secondary | ICD-10-CM | POA: Diagnosis present

## 2024-05-28 DIAGNOSIS — Z79899 Other long term (current) drug therapy: Secondary | ICD-10-CM

## 2024-05-28 DIAGNOSIS — Q51818 Other congenital malformations of uterus: Secondary | ICD-10-CM | POA: Diagnosis not present

## 2024-05-28 DIAGNOSIS — G40909 Epilepsy, unspecified, not intractable, without status epilepticus: Secondary | ICD-10-CM | POA: Diagnosis present

## 2024-05-28 LAB — CBC
HCT: 34.4 % — ABNORMAL LOW (ref 36.0–46.0)
Hemoglobin: 11.5 g/dL — ABNORMAL LOW (ref 12.0–15.0)
MCH: 28.5 pg (ref 26.0–34.0)
MCHC: 33.4 g/dL (ref 30.0–36.0)
MCV: 85.1 fL (ref 80.0–100.0)
Platelets: 211 K/uL (ref 150–400)
RBC: 4.04 MIL/uL (ref 3.87–5.11)
RDW: 12.5 % (ref 11.5–15.5)
WBC: 9.8 K/uL (ref 4.0–10.5)
nRBC: 0 % (ref 0.0–0.2)

## 2024-05-28 LAB — PROTIME-INR
INR: 1 (ref 0.8–1.2)
Prothrombin Time: 13.2 s (ref 11.4–15.2)

## 2024-05-28 LAB — D-DIMER, QUANTITATIVE: D-Dimer, Quant: 0.91 ug{FEU}/mL — ABNORMAL HIGH (ref 0.00–0.50)

## 2024-05-28 LAB — TYPE AND SCREEN
ABO/RH(D): O POS
Antibody Screen: NEGATIVE

## 2024-05-28 LAB — SYPHILIS: RPR W/REFLEX TO RPR TITER AND TREPONEMAL ANTIBODIES, TRADITIONAL SCREENING AND DIAGNOSIS ALGORITHM: RPR Ser Ql: NONREACTIVE

## 2024-05-28 MED ORDER — MISOPROSTOL 50MCG HALF TABLET
50.0000 ug | ORAL_TABLET | ORAL | Status: DC
  Administered 2024-05-28 – 2024-05-29 (×6): 50 ug via ORAL
  Filled 2024-05-28 (×6): qty 1

## 2024-05-28 MED ORDER — FENTANYL CITRATE (PF) 100 MCG/2ML IJ SOLN
50.0000 ug | INTRAMUSCULAR | Status: DC | PRN
  Administered 2024-05-28: 100 ug via INTRAVENOUS
  Filled 2024-05-28: qty 2

## 2024-05-28 MED ORDER — LACTATED RINGERS IV SOLN
INTRAVENOUS | Status: DC
  Administered 2024-05-28 – 2024-05-29 (×2): 125 mL via INTRAVENOUS

## 2024-05-28 MED ORDER — OXYCODONE HCL 5 MG PO TABS
5.0000 mg | ORAL_TABLET | ORAL | Status: DC | PRN
  Administered 2024-05-28: 5 mg via ORAL
  Filled 2024-05-28 (×2): qty 1

## 2024-05-28 MED ORDER — ZOLPIDEM TARTRATE 5 MG PO TABS
5.0000 mg | ORAL_TABLET | Freq: Every evening | ORAL | Status: DC | PRN
  Administered 2024-05-28: 5 mg via ORAL
  Filled 2024-05-28: qty 1

## 2024-05-28 MED ORDER — ACETAMINOPHEN 325 MG PO TABS
650.0000 mg | ORAL_TABLET | ORAL | Status: DC | PRN
  Administered 2024-05-28: 650 mg via ORAL
  Filled 2024-05-28 (×3): qty 2

## 2024-05-28 MED ORDER — TERBUTALINE SULFATE 1 MG/ML IJ SOLN: 0.2500 mg | Freq: Once | INTRAMUSCULAR | Status: DC | PRN

## 2024-05-28 MED ORDER — LIDOCAINE HCL (PF) 1 % IJ SOLN
30.0000 mL | INTRAMUSCULAR | Status: DC | PRN
  Filled 2024-05-28: qty 30

## 2024-05-28 MED ORDER — HYDROXYZINE HCL 25 MG PO TABS
50.0000 mg | ORAL_TABLET | Freq: Four times a day (QID) | ORAL | Status: DC | PRN
  Administered 2024-05-28: 50 mg via ORAL
  Filled 2024-05-28: qty 2

## 2024-05-28 MED ORDER — OXYTOCIN BOLUS FROM INFUSION: 333.0000 mL | Freq: Once | INTRAVENOUS | Status: DC

## 2024-05-28 MED ORDER — ONDANSETRON HCL 4 MG/2ML IJ SOLN
4.0000 mg | Freq: Four times a day (QID) | INTRAMUSCULAR | Status: DC | PRN
  Administered 2024-05-29: 4 mg via INTRAVENOUS
  Filled 2024-05-28: qty 2

## 2024-05-28 MED ORDER — MISOPROSTOL 50MCG HALF TABLET
50.0000 ug | ORAL_TABLET | ORAL | Status: DC
  Administered 2024-05-28 – 2024-05-29 (×6): 50 ug via VAGINAL
  Filled 2024-05-28 (×6): qty 1

## 2024-05-28 MED ORDER — OXYTOCIN-SODIUM CHLORIDE 30-0.9 UT/500ML-% IV SOLN
1.0000 m[IU]/min | INTRAVENOUS | Status: DC
  Administered 2024-05-29: 2 m[IU]/min via INTRAVENOUS

## 2024-05-28 MED ORDER — SOD CITRATE-CITRIC ACID 500-334 MG/5ML PO SOLN: 30.0000 mL | ORAL | Status: DC | PRN

## 2024-05-28 MED ORDER — OXYTOCIN-SODIUM CHLORIDE 30-0.9 UT/500ML-% IV SOLN
2.5000 [IU]/h | INTRAVENOUS | Status: DC
  Administered 2024-05-29: 2.5 [IU]/h via INTRAVENOUS
  Filled 2024-05-28: qty 500

## 2024-05-28 MED ORDER — LAMOTRIGINE 100 MG PO TABS
200.0000 mg | ORAL_TABLET | Freq: Two times a day (BID) | ORAL | Status: DC
  Administered 2024-05-28 (×2): 200 mg via ORAL
  Filled 2024-05-28 (×2): qty 2

## 2024-05-28 MED ORDER — LACTATED RINGERS IV SOLN
500.0000 mL | INTRAVENOUS | Status: DC | PRN
  Administered 2024-05-29: 500 mL via INTRAVENOUS

## 2024-05-28 NOTE — Discharge Summary (Signed)
 " Melinda Mills is a 34 y.o. female. She is at [redacted]w[redacted]d gestation. Patient's last menstrual period was 10/11/2023 (approximate). 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 She last felt baby move around 6pm but no movement since. She denies contractions or leaking fluid.   S: Resting comfortably. no CTX, no VB.no LOF,  No fetal movement.  Denies: HA, visual changes, SOB, or RUQ/epigastric pain  Maternal Medical History:   Past Medical History:  Diagnosis Date   ADHD (attention deficit hyperactivity disorder)    Depression    Seizures (HCC)     Past Surgical History:  Procedure Laterality Date   APPENDECTOMY  2008/2009    Allergies[1]  Prior to Admission medications  Medication Sig Start Date End Date Taking? Authorizing Provider  folic acid  (FOLVITE ) 1 MG tablet Take 1 tablet (1 mg total) by mouth daily. 10/11/23  Yes Gayland Lauraine PARAS, NP  LamoTRIgine  200 MG TB24 24 hour tablet Take 1 tablet (200 mg total) by mouth in the morning and at bedtime. 05/18/24 05/28/24 Yes Gregg Lek, MD  traZODone  (DESYREL ) 50 MG tablet Take 1 tablet (50 mg total) by mouth at bedtime as needed. for sleep 05/12/22  Yes Eappen, Saramma, MD  amphetamine -dextroamphetamine  (ADDERALL XR) 25 MG 24 hr capsule Take by mouth every morning. Patient not taking: Reported on 05/28/2024 08/20/22   [provider]  hydrOXYzine  (ATARAX ) 25 MG tablet TAKE 1/2-1 TABLETS (12.5-25 MG TOTAL) BY MOUTH 2 (TWO) TIMES DAILY AS NEEDED. 12/22/21   Massengill, Rankin, MD     Social History: She  reports that she has never smoked. She has never used smokeless tobacco. She reports that she does not currently use alcohol. She reports that she does not currently use drugs after having used the following drugs: Marijuana.  Family History: family history is not on file. She was adopted.  no history of gyn cancers  Review of Systems: A full review of systems was performed and negative except as noted in the HPI.     O:  BP (!) 123/53   Pulse 89   Temp 98 F (36.7 C) (Oral)   Resp 16   LMP 10/11/2023 (Approximate)  No results found for this or any previous visit (from the past 48 hours).   Constitutional: NAD, AAOx3  HE/ENT: extraocular movements grossly intact, moist mucous membranes CV: RRR PULM: nl respiratory effort, CTABL     Abd: gravid, non-tender, non-distended, soft      Ext: Non-tender, Nonedematous   Psych: mood appropriate, speech normal Pelvic: deferred  Fetal  monitoring: no fetal heart beat detected with EFM  CLINICAL HISTORY: Decreased fetal movement; [redacted] weeks gestation of pregnancy.   FINDINGS:   FETUS: A single intrauterine pregnancy is present. No movement.   POSITION: The fetus position is Cephalic.   HEART RATE: No fetal heart tones detected.   PLACENTA: The placenta is located Posterior.   AMNIOTIC FLUID: Amniotic fluid index or AFI measures 13.6 cm.   BPD measures 7.2 cm. Estimated gestational age [redacted] weeks 6 days.   IMPRESSION: 1. No fetal heart tones or fetal  movement detected, consistent with fetal demise. 2. Single intrauterine pregnancy with sonographic gestational age of [redacted] weeks 6 days by current biometry.  A/P: 34 y.o. [redacted]w[redacted]d here for antenatal surveillance for decreased fetal movement, IUFD found  Principle Diagnosis: IUFD  IUFD: no heart beat or fetal movement seen on US . No obvious cause or reason noted on ultrasound. Discussed these findings with the patient and advised this is not her fault or  anything she caused or could have prevented. Discussed delivery planning with her and she would like to proceed with delivery today. Her partner is 3.5hrs away and is driving back to be with her. She would like to discharge home to pack a bag and wait for him, then return to the hospital upon his arrival for a scheduled induction of labor. Dr. IVAR Schermerhorn notified. Upon return to L&D will check IUFD labs, infection labs, and begin induction process. Will send placenta to pathology and collect microarray. Discussed she can choose burial or cremation for the baby afterward.    Edsel Charlies Blush, CNM 05/28/2024 1:07 AM     [1]  Allergies Allergen Reactions   Guaifenesin Dermatitis   Guanfacine  Rash and Dermatitis   "

## 2024-05-28 NOTE — Progress Notes (Signed)
 Pt is going to go home and return around 4-5am with FOB for IOL.

## 2024-05-28 NOTE — H&P (Signed)
 OB History & Physical   History of Present Illness:  Chief Complaint:   HPI:  Melinda Mills is a 34 y.o. G2P1001 female at [redacted]w[redacted]d dated by US .  She presents to L&D for  IOL for IUFD. She had decreased fetal movement yesterday, last known fetal movement 05/27/24 @ 1800 and came in and was diagnosed with an IUFD. She desired to be induced immediately, but wanted to go home and get her partner as he was driving to be with her from 3.5hrs away, then return for the induction.   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 Authorizing Provider  amphetamine -dextroamphetamine  (ADDERALL XR) 25 MG 24 hr capsule Take by mouth every morning. Patient not taking: Reported on 05/28/2024 08/20/22   [provider]  folic acid  (FOLVITE ) 1 MG tablet Take 1 tablet (1 mg total) by mouth daily. 10/11/23   Gayland Lauraine PARAS, NP  hydrOXYzine  (ATARAX ) 25 MG tablet TAKE 1/2-1 TABLETS (12.5-25 MG TOTAL) BY MOUTH 2 (TWO) TIMES DAILY AS NEEDED. 12/22/21   Massengill, Rankin, MD  LamoTRIgine  200 MG TB24 24 hour tablet Take 1 tablet (200 mg total) by mouth in the morning and at bedtime. 05/18/24 05/28/24  Camara, Amadou, MD  traZODone  (DESYREL ) 50 MG tablet Take 1 tablet (50 mg total) by mouth at bedtime as needed. for sleep 05/12/22   Eappen, Saramma, MD    Prenatal care site: [facility]   Social History: She  reports that she has  never smoked. She has never used smokeless tobacco. She reports that she does not currently use alcohol. She reports that she does not currently use drugs after having used the following drugs: Marijuana.  Family History: family history is not on file. She was adopted.   Review of Systems: A full review of systems was performed and negative except as noted in the HPI.     Physical Exam:  Vital Signs: LMP 10/11/2023 (Approximate)  General: no acute distress.  HEENT: normocephalic, atraumatic Heart: regular rate & rhythm.  No murmurs/rubs/gallops Lungs: clear to auscultation bilaterally, normal respiratory effort Abdomen: soft, gravid, non-tender;  EFW: 3lb Pelvic:   External: Normal external female genitalia  Cervix: fingertip/thick/-2, anterior   Extremities: non-tender, symmetric, mild edema bilaterally.  DTRs: +2  Neurologic: Alert & oriented x 3.    No results found for this or any previous visit (from the past 24 hours).  Pertinent Results:  Prenatal Labs: Blood type/Rh O positive  Antibody screen neg  Rubella Immune  Varicella Non-Immune  RPR NR  HBsAg Neg  HIV NR  GC neg  Chlamydia neg  Genetic screening MaterniT21 negative  1 hour GTT 117  3 hour GTT   GBS Too early for gestational age     Cephalic by leopolds  US  OB Limited Result Date: 05/28/2024 EXAM: ULTRASOUND OB LIMITED TECHNIQUE: Transabdominal obstetric pelvic ultrasound was performed with color Doppler flow evaluation. COMPARISON: None available. CLINICAL HISTORY:  Decreased fetal movement; [redacted] weeks gestation of pregnancy. FINDINGS: FETUS: A single intrauterine pregnancy is present. No movement. POSITION: The fetus position is Cephalic. HEART RATE: No fetal heart tones detected. PLACENTA: The placenta is located Posterior. AMNIOTIC FLUID: Amniotic fluid index or AFI measures 13.6 cm. BPD measures 7.2 cm. Estimated gestational age [redacted] weeks 6 days. IMPRESSION: 1. No fetal heart tones or fetal movement detected,  consistent with fetal demise. 2. Single intrauterine pregnancy with sonographic gestational age of [redacted] weeks 6 days by current biometry. Electronically signed by: Franky Crease MD 05/28/2024 12:48 AM EST RP Workstation: [id number]    Assessment:  Melinda Mills is a 34 y.o. G2P1001 female at [redacted]w[redacted]d with IUFD.   Plan:  1. Admit to Labor & Delivery; consents reviewed and obtained - Dr. IVAR Schermerhorn notified of admission and plan of care  2. IUFD - confirmed by US  - Normal MaterniT21 genetic test in pregnancy - Labs: TORCH labs ordered, lupus panel ordered, - Microarray for after delivery - Discussed with them cremation vs burial and they are considering  3. Routine OB: - Prenatal labs reviewed, as above - Rh positive - CBC, T&S, RPR on admit - Clear fluids, saline lock  4. Induction of Labor -  Contractions rare, external toco in place -  Pelvis proven to 3190g -  Plan for induction with cytotec , Eldonna catheter, pitocin , and AROM as indicated -  Plan for continuous fetal monitoring  -  Maternal pain control as desired; requesting IVPM, regional anesthesia - Anticipate vaginal delivery  5. Seizures - Lamictal  increased to 200mg  BID by neurologist - Lamictal  level checked 05/18/24 and subtherapeutic - No seizures in this pregnancy  6. Anxiety, depression, history of suicidal ideation: - Took Abilify  and Lexapro  prior to pregnancy - No medications in pregnancy - Will need mental health follow-up postpartum  Edsel Charlies Blush, CNM 05/28/2024 6:08 AM        [1]  Allergies Allergen Reactions   Guaifenesin Dermatitis   Guanfacine  Rash and Dermatitis

## 2024-05-28 NOTE — Progress Notes (Signed)
 Discussed IUFD induction process with pt. Pt verbalizes understanding. Reviewed forms and pt will fill out blue form and they will discuss funeral home arrangements.

## 2024-05-29 ENCOUNTER — Inpatient Hospital Stay

## 2024-05-29 ENCOUNTER — Encounter: Payer: Self-pay | Admitting: Obstetrics and Gynecology

## 2024-05-29 LAB — CYTOMEGALOVIRUS DNA, QUANTITATIVE REAL-TIME PCR, PLASMA
CMV DNA Quant: NEGATIVE [IU]/mL
Log10 CMV Qn DNA Pl: UNDETERMINED {Log_IU}/mL

## 2024-05-29 LAB — TOXOPLASMA GONDII ANTIBODY, IGM: Toxoplasma Antibody- IgM: <3 [AU]/ml (ref 0.0–7.9)

## 2024-05-29 LAB — LUPUS ANTICOAGULANT PANEL
DRVVT: 32.7 s (ref 0.0–47.0)
PTT Lupus Anticoagulant: 33.4 s (ref 0.0–43.5)

## 2024-05-29 LAB — PARVOVIRUS B19 ANTIBODY, IGG AND IGM
Parovirus B19 IgG Abs: 0.1 {index} (ref 0.0–0.8)
Parovirus B19 IgM Abs: 0.1 {index} (ref 0.0–0.8)

## 2024-05-29 LAB — INFECT DISEASE AB IGM REFLEX 1

## 2024-05-29 MED ORDER — DIBUCAINE (PERIANAL) 1 % EX OINT: 1.0000 | TOPICAL_OINTMENT | CUTANEOUS | Status: DC | PRN

## 2024-05-29 MED ORDER — EPHEDRINE 5 MG/ML INJ: 10.0000 mg | INTRAVENOUS | Status: DC | PRN

## 2024-05-29 MED ORDER — PHENYLEPHRINE 80 MCG/ML (10ML) SYRINGE FOR IV PUSH (FOR BLOOD PRESSURE SUPPORT): 80.0000 ug | PREFILLED_SYRINGE | INTRAVENOUS | Status: DC | PRN

## 2024-05-29 MED ORDER — OXYCODONE HCL 5 MG PO TABS: 5.0000 mg | ORAL_TABLET | ORAL | Status: DC | PRN

## 2024-05-29 MED ORDER — COCONUT OIL OIL: 1.0000 | TOPICAL_OIL | Status: DC | PRN

## 2024-05-29 MED ORDER — LAMOTRIGINE 100 MG PO TABS
200.0000 mg | ORAL_TABLET | Freq: Two times a day (BID) | ORAL | Status: DC
  Filled 2024-05-29: qty 2

## 2024-05-29 MED ORDER — IBUPROFEN 600 MG PO TABS: 600.0000 mg | ORAL_TABLET | Freq: Four times a day (QID) | ORAL | Status: DC

## 2024-05-29 MED ORDER — PRENATAL MULTIVITAMIN CH
1.0000 | ORAL_TABLET | Freq: Every day | ORAL | Status: DC
  Filled 2024-05-29: qty 1

## 2024-05-29 MED ORDER — SIMETHICONE 80 MG PO CHEW: 80.0000 mg | CHEWABLE_TABLET | ORAL | Status: DC | PRN

## 2024-05-29 MED ORDER — OXYCODONE HCL 5 MG PO TABS: 10.0000 mg | ORAL_TABLET | ORAL | Status: DC | PRN

## 2024-05-29 MED ORDER — ACETAMINOPHEN 325 MG PO TABS: 650.0000 mg | ORAL_TABLET | ORAL | Status: DC | PRN

## 2024-05-29 MED ORDER — LIDOCAINE-EPINEPHRINE (PF) 1.5 %-1:200000 IJ SOLN
INTRAMUSCULAR | Status: DC | PRN
  Administered 2024-05-29: 3 mL via EPIDURAL

## 2024-05-29 MED ORDER — LACTATED RINGERS IV SOLN: INTRAVENOUS | Status: DC

## 2024-05-29 MED ORDER — BENZOCAINE-MENTHOL 20-0.5 % EX AERO: 1.0000 | INHALATION_SPRAY | CUTANEOUS | Status: DC | PRN

## 2024-05-29 MED ORDER — BENZOCAINE-MENTHOL 20-0.5 % EX AERO
1.0000 | INHALATION_SPRAY | CUTANEOUS | Status: DC | PRN
  Administered 2024-05-29: 1 via TOPICAL
  Filled 2024-05-29: qty 56

## 2024-05-29 MED ORDER — DIPHENHYDRAMINE HCL 25 MG PO CAPS: 25.0000 mg | ORAL_CAPSULE | Freq: Four times a day (QID) | ORAL | Status: DC | PRN

## 2024-05-29 MED ORDER — FENTANYL-BUPIVACAINE-NACL 0.5-0.125-0.9 MG/250ML-% EP SOLN
EPIDURAL | Status: AC
  Filled 2024-05-29: qty 250

## 2024-05-29 MED ORDER — FENTANYL-BUPIVACAINE-NACL 0.5-0.125-0.9 MG/250ML-% EP SOLN
12.0000 mL/h | EPIDURAL | Status: DC | PRN
  Administered 2024-05-29: 12 mL/h via EPIDURAL

## 2024-05-29 MED ORDER — SENNOSIDES-DOCUSATE SODIUM 8.6-50 MG PO TABS: 2.0000 | ORAL_TABLET | Freq: Every day | ORAL | Status: DC

## 2024-05-29 MED ORDER — ONDANSETRON HCL 4 MG PO TABS: 4.0000 mg | ORAL_TABLET | ORAL | Status: DC | PRN

## 2024-05-29 MED ORDER — ONDANSETRON HCL 4 MG/2ML IJ SOLN: 4.0000 mg | INTRAMUSCULAR | Status: DC | PRN

## 2024-05-29 MED ORDER — WITCH HAZEL-GLYCERIN EX PADS: 1.0000 | MEDICATED_PAD | CUTANEOUS | Status: DC | PRN

## 2024-05-29 MED ORDER — LIDOCAINE HCL (PF) 1 % IJ SOLN
INTRAMUSCULAR | Status: DC | PRN
  Administered 2024-05-29: 4 mL via SUBCUTANEOUS

## 2024-05-29 MED ORDER — SODIUM CHLORIDE 0.9 % IV SOLN
INTRAVENOUS | Status: DC | PRN
  Administered 2024-05-29: 5 mL via EPIDURAL
  Administered 2024-05-29: 3 mL via EPIDURAL

## 2024-05-29 MED ORDER — LACTATED RINGERS IV SOLN: 500.0000 mL | Freq: Once | INTRAVENOUS | Status: DC

## 2024-05-29 MED ORDER — DIPHENHYDRAMINE HCL 50 MG/ML IJ SOLN
12.5000 mg | INTRAMUSCULAR | Status: DC | PRN
  Administered 2024-05-29 (×2): 12.5 mg via INTRAVENOUS
  Filled 2024-05-29: qty 1

## 2024-05-29 MED ORDER — TETANUS-DIPHTH-ACELL PERTUSSIS 5-2-15.5 LF-MCG/0.5 IM SUSP
0.5000 mL | Freq: Once | INTRAMUSCULAR | Status: DC
  Filled 2024-05-29: qty 0.5

## 2024-05-29 NOTE — Discharge Summary (Signed)
 Postpartum Discharge Summary  Patient Name: Melinda Mills DOB: [birth year] MRN: [id number]  Date of admission: 05/28/2024 Delivery date:05/29/2024 Delivering provider: MYRON NEST Date of discharge: 05/29/2024  Primary OB: [facility] OFE:Ejupzwu'd last menstrual period was 10/11/2023 (approximate). EDC Estimated Date of Delivery: 07/28/24 Gestational Age at Delivery: [redacted]w[redacted]d   Admitting diagnosis: IUFD at 20 weeks or more of gestation [O73.4XX0] Intrauterine pregnancy: [redacted]w[redacted]d     Secondary diagnosis:   Principal Problem:   NSVD (normal spontaneous vaginal delivery) Active Problems:   IUFD at 20 weeks or more of gestation   Discharge Diagnosis: Preterm Pregnancy Delivered of an IUFD  Hospital course: Induction of Labor With Vaginal Delivery   34 y.o. yo G2P1101 at [redacted]w[redacted]d was admitted to the hospital 05/28/2024 for induction of labor.  Indication for induction: IUFD.  Patient had an labor course complicated by none Membrane Rupture Time/Date: 10:17 AM,03/11/2024  Delivery Method:Vaginal, Spontaneous Operative Delivery:N/A Episiotomy: None Lacerations:  None Details of delivery can be found in separate delivery note.  Patient had a postpartum course complicated by none. Patient is discharged home 05/29/2024.  Newborn Data: Birth date:05/29/2024 Birth time:10:19 AM Gender:Female Living status:Fetal Demise Apgars: ,  Weight:1450 g                                            Post partum procedures:none Induction:: Pitocin  and Cytotec  Complications: None Delivery Type: spontaneous vaginal delivery Anesthesia: epidural anesthesia Placenta: spontaneous To Pathology: Yes   Prenatal Labs:  Blood type/Rh O positive  Antibody screen neg  Rubella Immune  Varicella Non-Immune  RPR NR  HBsAg Neg  HIV NR  GC neg  Chlamydia neg  Genetic screening MaterniT21 negative  1 hour GTT 117  3 hour GTT    GBS Too early for gestational age    Magnesium  Sulfate received: No BMZ  received: No Rhophylac:was not indicated MMR: was not indicated Varivax vaccine given: offered and declined T-DaP:Given prenatally Flu: declined prenatally  Transfusion:No  Physical exam  Vitals:   05/29/24 1035 05/29/24 1050 05/29/24 1105 05/29/24 1719  BP: (!) 117/52 (!) 104/54 116/65   Pulse: 91 90 90   Resp:    16  Temp:    98.5 F (36.9 C)  TempSrc:    Oral  SpO2:      Weight:      Height:       General: alert and cooperative Lochia: appropriate Uterine Fundus: firm Perineum:minimal edema/intact Incision: N/A DVT Evaluation: No evidence of DVT seen on physical exam.  Labs: Lab Results  Component Value Date   WBC 9.8 05/28/2024   HGB 11.5 (L) 05/28/2024   HCT 34.4 (L) 05/28/2024   MCV 85.1 05/28/2024   PLT 211 05/28/2024      Latest Ref Rng & Units 10/11/2023    4:02 PM  CMP  Glucose 70 - 99 mg/dL 87   BUN 6 - 20 mg/dL 9   Creatinine 9.42 - 8.99 mg/dL 9.16   Sodium 865 - 855 mmol/L 143   Potassium 3.5 - 5.2 mmol/L 4.1   Chloride 96 - 106 mmol/L 105   CO2 20 - 29 mmol/L 23   Calcium  8.7 - 10.2 mg/dL 9.1   Total Protein 6.0 - 8.5 g/dL 6.7   Total Bilirubin 0.0 - 1.2 mg/dL <9.7   Alkaline Phos 44 - 121 IU/L 100   AST 0 - 40 IU/L  15   ALT 0 - 32 IU/L 11    Edinburgh Score:    05/29/2024    5:19 PM  Edinburgh Postnatal Depression Scale Screening Tool  I have been able to laugh and see the funny side of things. 1  I have looked forward with enjoyment to things. 0  I have blamed myself unnecessarily when things went wrong. 2  I have been anxious or worried for no good reason. 2  I have felt scared or panicky for no good reason. 0  Things have been getting on top of me. 2  I have been so unhappy that I have had difficulty sleeping. 0  I have felt sad or miserable. 1  I have been so unhappy that I have been crying. 1  The thought of harming myself has occurred to me. 0  Edinburgh Postnatal Depression Scale Total 9    Risk assessment for postpartum VTE  and prophylactic treatment: Very high risk factors: None High risk factors: None Moderate risk factors: None  Postpartum VTE prophylaxis with LMWH not indicated  After visit meds:  Allergies as of 05/29/2024       Reactions   Guaifenesin Dermatitis   Guanfacine  Rash, Dermatitis        Medication List     STOP taking these medications    amphetamine -dextroamphetamine  25 MG 24 hr capsule Commonly known as: ADDERALL XR   folic acid  1 MG tablet Commonly known as: FOLVITE        TAKE these medications    hydrOXYzine  25 MG tablet Commonly known as: ATARAX  TAKE 1/2-1 TABLETS (12.5-25 MG TOTAL) BY MOUTH 2 (TWO) TIMES DAILY AS NEEDED.   LamoTRIgine  200 MG Tb24 24 hour tablet Take 1 tablet (200 mg total) by mouth in the morning and at bedtime.   traZODone  50 MG tablet Commonly known as: DESYREL  Take 1 tablet (50 mg total) by mouth at bedtime as needed. for sleep        Discharge home in stable condition Infant Feeding: n/a Infant Disposition:morgue Discharge instruction: per After Visit Summary and Postpartum booklet. Activity: Advance as tolerated. Pelvic rest for 6 weeks.  Diet: routine diet Anticipated Birth Control: Unsure Postpartum Appointment:6 weeks Additional Postpartum F/U: Postpartum Depression checkup Future Appointments: Future Appointments  Date Time Provider Department Center  06/05/2024  3:15 PM Gayland Lauraine PARAS, NP [facility] None   Follow up Visit:  Follow-up Information     Myron Nest, CNM. Schedule an appointment as soon as possible for a visit in 1 week(s).   Specialty: Certified Nurse Midwife Why: for a mood check Contact information: [address] [place] [address] [phone]                 Plan:  [name] was discharged to home in good condition. Follow-up appointment as directed.    Signed: Jenifer E Kaicen Desena 05/29/2024 5:35 PM

## 2024-05-29 NOTE — Anesthesia Preprocedure Evaluation (Signed)
"                                    Anesthesia Evaluation  Patient identified by MRN, date of birth, ID band Patient awake    Reviewed: Allergy & Precautions, H&P , NPO status , Patient's Chart, lab work & pertinent test results, reviewed documented beta blocker date and time   Airway Mallampati: II  TM Distance: >3 FB Neck ROM: full    Dental no notable dental hx. (+) Teeth Intact   Pulmonary neg pulmonary ROS   Pulmonary exam normal breath sounds clear to auscultation       Cardiovascular Exercise Tolerance: Good negative cardio ROS  Rhythm:regular Rate:Normal     Neuro/Psych   Anxiety Depression    negative neurological ROS  negative psych ROS   GI/Hepatic negative GI ROS, Neg liver ROS,,,  Endo/Other  negative endocrine ROSdiabetes    Renal/GU Renal disease     Musculoskeletal   Abdominal   Peds  Hematology negative hematology ROS (+)   Anesthesia Other Findings   Reproductive/Obstetrics                              Anesthesia Physical Anesthesia Plan  ASA: 2  Anesthesia Plan: Epidural   Post-op Pain Management:    Induction:   PONV Risk Score and Plan:   Airway Management Planned:   Additional Equipment:   Intra-op Plan:   Post-operative Plan:   Informed Consent: I have reviewed the patients History and Physical, chart, labs and discussed the procedure including the risks, benefits and alternatives for the proposed anesthesia with the patient or authorized representative who has indicated his/her understanding and acceptance.       Plan Discussed with:   Anesthesia Plan Comments:         Anesthesia Quick Evaluation  "

## 2024-05-29 NOTE — Anesthesia Procedure Notes (Signed)
 Epidural Patient location during procedure: OB End time: 05/29/2024 4:31 AM  Staffing Performed: anesthesiologist   Preanesthetic Checklist Completed: patient identified, IV checked, site marked, risks and benefits discussed, surgical consent, monitors and equipment checked, pre-op evaluation and timeout performed  Epidural Patient position: sitting Prep: Betadine Patient monitoring: heart rate, continuous pulse ox and blood pressure Approach: midline Location: L4-L5 Injection technique: LOR saline  Needle:  Needle type: Tuohy  Needle gauge: 17 G Needle length: 9 cm and 9 Needle insertion depth: 5 cm Catheter type: closed end flexible Catheter size: 19 Gauge Catheter at skin depth: 11 cm Test dose: negative and 1.5% lidocaine  with Epi 1:200 K  Assessment Sensory level: T10 Events: blood not aspirated, no cerebrospinal fluid, injection not painful, no injection resistance, no paresthesia and negative IV test  Additional Notes   Patient tolerated the insertion well without complications.-SATD -IVTD. No paresthesia. Refer to [facility] nursing for VS and dosingReason for block:procedure for pain

## 2024-05-30 ENCOUNTER — Other Ambulatory Visit
Admission: RE | Admit: 2024-05-30 | Discharge: 2024-05-30 | Disposition: A | Discharge status: Home / Self Care | Source: Ambulatory Visit | Attending: Certified Nurse Midwife | Admitting: Certified Nurse Midwife

## 2024-05-30 DIAGNOSIS — Z8669 Personal history of other diseases of the nervous system and sense organs: Secondary | ICD-10-CM | POA: Diagnosis present

## 2024-05-31 NOTE — Anesthesia Postprocedure Evaluation (Signed)
"   Anesthesia Post Note  Patient: Melinda Mills  Procedure(s) Performed: AN AD HOC LABOR EPIDURAL  Patient location during evaluation: Mother Baby Anesthesia Type: Epidural Level of consciousness: awake and alert Pain management: pain level controlled Vital Signs Assessment: post-procedure vital signs reviewed and stable Respiratory status: spontaneous breathing, nonlabored ventilation and respiratory function stable Cardiovascular status: stable Postop Assessment: no headache, no backache and epidural receding Anesthetic complications: no   No notable events documented.   Last Vitals: There were no vitals filed for this visit.  Last Pain: There were no vitals filed for this visit.               [place] KANDICE Clause      "

## 2024-06-01 LAB — SURGICAL PATHOLOGY

## 2024-06-01 LAB — CARDIOLIPIN ANTIBODIES, IGG, IGM, IGA
Anticardiolipin IgA: <9 U/mL (ref 0–11)
Anticardiolipin IgG: <9 GPL U/mL (ref 0–14)
Anticardiolipin IgM: <9 [MPL'U]/mL (ref 0–12)

## 2024-06-05 ENCOUNTER — Encounter: Payer: Self-pay | Admitting: Neurology

## 2024-06-05 ENCOUNTER — Ambulatory Visit: Admitting: Neurology

## 2024-06-05 VITALS — BP 118/76 | HR 84 | Ht 63.0 in | Wt 177.5 lb

## 2024-06-05 DIAGNOSIS — G40909 Epilepsy, unspecified, not intractable, without status epilepticus: Secondary | ICD-10-CM | POA: Diagnosis not present

## 2024-06-05 DIAGNOSIS — R569 Unspecified convulsions: Secondary | ICD-10-CM

## 2024-06-05 MED ORDER — FOLIC ACID 1 MG PO TABS: 1.0000 mg | ORAL_TABLET | Freq: Every day | ORAL | 11 refills | Status: AC

## 2024-06-05 NOTE — Progress Notes (Signed)
 "  [facility]  PATIENT: Melinda Mills DOB: 1990-12-13  REQUESTING CLINICIAN: Donnie Handing, PA HISTORY FROM: Patient  REASON FOR VISIT: Seizure  HISTORICAL  CHIEF COMPLAINT:  Chief Complaint  Patient presents with   Follow-up    Room 12 Alone Seizures- last Ds:nczm 1 year   Update 06/05/24 SS: EEG normal June 2025. Last Lamictal  level was 2.7 in may 2025. In Dec 2025 Dr .Gregg increased Lamictal  to 200 mg BID while pregnant. Remains on this dose. Has been doing fine with this dose. Unfortunately she lost her unborn baby at 31 weeks due to cord wrapped around the baby's neck. This was 05/28/24. Right now taking Lamictal  XR 200 mg, 2 tablets at bedtime. No recent seizures. Would like to try to get pregnant again soon. Is not taking folic acid .   Update 10/11/2023 SS: On Lamictal  100 mg twice daily. Reports seizure few months ago, had missed her medication for a few days, described seizure as, feeling her body was light, her head was also light, will have twitches in her extremities, a few at a time, she is zoned out, isn't aware. Afterwards is confused, doesn't remember a few hours before. Is good during school year with medication compliance but off season with school alters her medication regimen. 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 Has 57 year old son. Is not planning pregnancy. Is not on birth control.   INTERVAL HISTORY 10/05/2022:  Melinda Mills presents today for follow-up, last visit was in November and since then she has been doing well, she was recommended to continue with lamotrigine  100 mg twice daily but again she has been taking the medication daily. Denies any seizure or seizure-like activity.  She does state a couple times she had presyncopal episodes but actually did not fall.  Otherwise she is doing very well, she is compliant with the medication, even though she is taking it once a day instead  of twice daily, denies any side effect.  No seizure or seizure activity, no concerns, or questions.   INTERVAL HISTORY 04/06/2022:  Patient presents today for follow-up, last visit was October 12, at that time plan was to switch the Keppra  to lamotrigine  because of side effect and she reports switching the medication and increasing the lamotrigine  to 100 mg twice daily.  Reports that  her mood is good, she discontinued Keppra , denies any seizure or seizure-like activity.  Overall she is doing very well. No complaints, no concerns.   HISTORY OF PRESENT ILLNESS:  This is a 34 year old woman past medical history of major depressive disorder, anxiety disorder who is presenting to establish care for seizure.  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  At that time, she was told that her seizures were induced by stress.  She described them as fainting episodes, sometime associated with twitching.  She was told during her seizures, her breathing gets shallow.  She reports prior to these events sometimes she will feel like almost having a panic attack, that she feels lightheaded then she passes out.  She did have a EEG in 2016 which showed epileptogenic potential, therefore she was started on Keppra .  She has been on Keppra  for 5 years then discontinued.  She reported back in August she was admitted at  behavioral health and Keppra  was restarted.  Since then she report being more anxious and depressed.  She reported her last  seizure was 3 days ago and she was very upset on the phone with he friends, then she passed out. Denies any injuries with these seizures.    Handedness: Right   Onset: 2013  Seizure Type: Syncope, sometimes feels lightheaded, feels like she is going to have a panic attack   Current frequency: Last episode 3 days ago, prior to that was a few months ago   Any injuries from seizures: Hit head  Seizure risk factors: None reported    Previous ASMs: Levetiracetam , Lamotrigine    Currenty ASMs: Lamotrigine  100 mg BID  ASMs side effects: Drowsiness  Brain Images: Normal 2016  Previous EEGs: paroxysmal bursts of semi-rhythmic delta activity intermixed with sharp components  OTHER MEDICAL CONDITIONS: Anxiety. Depression   REVIEW OF SYSTEMS: Full 14 system review of systems performed and negative with exception of: As noted in the HPI    ALLERGIES: Allergies  Allergen Reactions   Guaifenesin Dermatitis   Guanfacine  Rash and Dermatitis    HOME MEDICATIONS: Outpatient Medications Prior to Visit  Medication Sig Dispense Refill   LamoTRIgine  200 MG TB24 24 hour tablet Take 1 tablet (200 mg total) by mouth in the morning and at bedtime. 14 tablet 0   traZODone  (DESYREL ) 50 MG tablet Take 1 tablet (50 mg total) by mouth at bedtime as needed. for sleep 90 tablet 0   hydrOXYzine  (ATARAX ) 25 MG tablet TAKE 1/2-1 TABLETS (12.5-25 MG TOTAL) BY MOUTH 2 (TWO) TIMES DAILY AS NEEDED. (Patient not taking: Reported on 06/05/2024) 30 tablet 0   No facility-administered medications prior to visit.    PAST MEDICAL HISTORY: Past Medical History:  Diagnosis Date   ADHD (attention deficit hyperactivity disorder)    Depression    Seizures (HCC)     PAST SURGICAL HISTORY: Past Surgical History:  Procedure Laterality Date   APPENDECTOMY  2008/2009    FAMILY HISTORY: Family History  Adopted: Yes    SOCIAL HISTORY: Social History   Socioeconomic History   Marital status: Legally Separated    Spouse name: Not on file   Number of children: 1   Years of education: Not on file   Highest education level: Bachelor's degree (e.g., BA, AB, BS)  Occupational History   Not on file  Tobacco Use   Smoking status: Never   Smokeless tobacco: Never  Vaping Use   Vaping status: Never Used  Substance and Sexual Activity   Alcohol use: Not Currently    Comment: A few drinks per month   Drug use: Not Currently    Types:  Marijuana   Sexual activity: Yes    Partners: Female    Birth control/protection: Condom  Other Topics Concern   Not on file  Social History Narrative   Right handed    Caffeine 3 cups per day    Social Drivers of Health   Tobacco Use: Low Risk (06/05/2024)   Patient History    Smoking Tobacco Use: Never    Smokeless Tobacco Use: Never    Passive Exposure: Not on file  Financial Resource Strain: High Risk (01/05/2024)   Received from [facility]   Overall Financial Resource Strain (CARDIA)    Difficulty of Paying Living Expenses: Hard  Food Insecurity: No Food Insecurity (05/28/2024)   Epic    Worried About Running Out of Food in the Last Year: Never true    Ran Out of Food in the Last Year: Never true  Transportation Needs: No Transportation Needs (05/28/2024)   Epic  Lack of Transportation (Medical): No    Lack of Transportation (Non-Medical): No  Physical Activity: Not on file  Stress: Not on file  Social Connections: Moderately Isolated (05/28/2024)   Social Connection and Isolation Panel    Frequency of Communication with Friends and Family: Three times a week    Frequency of Social Gatherings with Friends and Family: Three times a week    Attends Religious Services: 1 to 4 times per year    Active Member of Clubs or Organizations: No    Attends Banker Meetings: Never    Marital Status: Never married  Intimate Partner Violence: Not At Risk (05/28/2024)   Epic    Fear of Current or Ex-Partner: No    Emotionally Abused: No    Physically Abused: No    Sexually Abused: No  Depression (PHQ2-9): Low Risk (08/26/2022)   Depression (PHQ2-9)    PHQ-2 Score: 4  Recent Concern: Depression (PHQ2-9) - Medium Risk (06/28/2022)   Depression (PHQ2-9)    PHQ-2 Score: 7  Alcohol Screen: Low Risk (12/15/2021)   Alcohol Screen    Last Alcohol Screening Score (AUDIT): 1  Housing: Low Risk (05/28/2024)   Epic    Unable to Pay for Housing in the Last Year: No     Number of Times Moved in the Last Year: 0    Homeless in the Last Year: No  Utilities: Not At Risk (05/28/2024)   Epic    Threatened with loss of utilities: No  Health Literacy: Not on file   Physical Exam  General: The patient is alert and cooperative at the time of the examination.  Skin: No significant peripheral edema is noted.  Neurologic Exam  Mental status: The patient is alert and oriented x 3 at the time of the examination. The patient has apparent normal recent and remote memory, with an apparently normal attention span and concentration ability.  Cranial nerves: Facial symmetry is present. Speech is normal, no aphasia or dysarthria is noted. Extraocular movements are full. Visual fields are full.  Motor: The patient has good strength in all 4 extremities.  Sensory examination: Soft touch sensation is symmetric on the face, arms, and legs.  Coordination: The patient has good finger-nose-finger and heel-to-shin bilaterally.  Gait and station: The patient has a normal gait.   Reflexes: Deep tendon reflexes are symmetric.  DIAGNOSTIC DATA (LABS, IMAGING, TESTING) - I reviewed patient records, labs, notes, testing and imaging myself where available.  Lab Results  Component Value Date   WBC 9.8 05/28/2024   HGB 11.5 (L) 05/28/2024   HCT 34.4 (L) 05/28/2024   MCV 85.1 05/28/2024   PLT 211 05/28/2024      Component Value Date/Time   NA 143 10/11/2023 1602   K 4.1 10/11/2023 1602   CL 105 10/11/2023 1602   CO2 23 10/11/2023 1602   GLUCOSE 87 10/11/2023 1602   GLUCOSE 92 12/16/2021 0639   BUN 9 10/11/2023 1602   CREATININE 0.83 10/11/2023 1602   CALCIUM  9.1 10/11/2023 1602   PROT 6.7 10/11/2023 1602   ALBUMIN 4.7 10/11/2023 1602   AST 15 10/11/2023 1602   ALT 11 10/11/2023 1602   ALKPHOS 100 10/11/2023 1602   BILITOT <0.2 10/11/2023 1602   GFRNONAA >60 12/16/2021 0639   GFRAA >60 02/10/2018 0527   Lab Results  Component Value Date   CHOL 198 12/16/2021    HDL 63 12/16/2021   LDLCALC 123 (H) 12/16/2021   TRIG 61 12/16/2021   Lab  Results  Component Value Date   HGBA1C 4.8 12/16/2021   No results found for: VITAMINB12 Lab Results  Component Value Date   TSH 3.132 12/16/2021    EEG 2016 Abnormal EEG demonstrating a few paroxysmal bursts of semi-rhythmic delta activity intermixed with sharp components.  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  Clinical correlate is required  Brain MRI 2016 Unremarkable MRI of the brain with and without gadolinium enhancement.  EEG 03/17/22: Normal   ASSESSMENT AND PLAN  1.  Seizure disorder 2.  History of anxiety and depression  Unfortunately suffered intrauterine fetal death at 31 weeks last week.  Was taking higher dose Lamictal  XR 400 mg daily due to low Lamictal  level.  Prior to pregnancy was on Lamictal  XR 200 mg daily.  Last seizure was about a year ago when she had missed several days of Lamictal .  Is hoping to become pregnant again soon.   - Check Lamictal  level - Likely reduce Lamictal  back down to prepregnancy dose XR 200 mg daily - Folic acid  1 mg daily - Call for seizure activity, follow-up 6 months  Lauraine Gayland MANDES, DNP  [facility] [address] [place], [place] [address] [phone]  "

## 2024-06-06 LAB — LAMOTRIGINE LEVEL: Lamotrigine Lvl: 5.6 ug/mL (ref 2.0–20.0)

## 2024-06-07 ENCOUNTER — Telehealth: Payer: Self-pay | Admitting: Neurology

## 2024-06-07 ENCOUNTER — Other Ambulatory Visit: Payer: Self-pay | Admitting: Neurology

## 2024-06-07 ENCOUNTER — Telehealth: Payer: Self-pay | Admitting: *Deleted

## 2024-06-07 ENCOUNTER — Other Ambulatory Visit: Payer: Self-pay

## 2024-06-07 MED ORDER — LAMOTRIGINE ER 200 MG PO TB24: 1.0000 | ORAL_TABLET | Freq: Every day | ORAL | 3 refills | Status: AC

## 2024-06-07 MED ORDER — LAMOTRIGINE ER 100 MG PO TB24: 100.0000 mg | ORAL_TABLET | Freq: Every day | ORAL | 0 refills | Status: DC

## 2024-06-07 MED ORDER — LAMOTRIGINE ER 200 MG PO TB24: 200.0000 mg | ORAL_TABLET | Freq: Every day | ORAL | 3 refills | Status: DC

## 2024-06-07 NOTE — Telephone Encounter (Signed)
 Sent message to PA team.  To see if Prior Melinda Mills is needed

## 2024-06-07 NOTE — Telephone Encounter (Signed)
" ° °  Maybe a PA is needed. Pt has a history of taking medication. Just looks like it the dose was increased.  "

## 2024-06-07 NOTE — Telephone Encounter (Signed)
 I called, left a message, will send my chart note, will reduce Lamictal  XR 400 mg daily, down to prepregnancy level, was taking XR 200 mg daily. Currently taking XR 200 mg BID, will reduce down to XR 100 mg AM for 1-2 weeks, then go down to XR 200 mg daily (pregnancy level). She had intrauterine fetal death 06/07/24 with delivery at 31 weeks.   Meds ordered this encounter  Medications   lamoTRIgine  (LAMICTAL  XR) 100 MG 24 hour tablet    Sig: Take 1 tablet (100 mg total) by mouth daily.    Dispense:  14 tablet    Refill:  0   LamoTRIgine  200 MG TB24 24 hour tablet    Sig: Take 1 tablet (200 mg total) by mouth at bedtime.    Dispense:  90 tablet    Refill:  3

## 2024-06-12 LAB — POC/TISSUE MICROARRAY

## 2024-06-13 ENCOUNTER — Telehealth: Payer: Self-pay

## 2024-06-13 ENCOUNTER — Other Ambulatory Visit (HOSPITAL_COMMUNITY): Payer: Self-pay

## 2024-06-13 NOTE — Telephone Encounter (Signed)
 Pharmacy Patient Advocate Encounter   Received notification from Pt Calls Messages that prior authorization for LamoTRIgine  200 MG Tb24 24 hour tablet is required/requested.   Insurance verification completed.   The patient is insured through [facility] ADVANT[employer]/RX ADVANCE.   Per test claim: The current 90 day co-pay is, $45.00.  No PA needed at this time. This test claim was processed through [facility]- copay amounts may vary at other pharmacies due to pharmacy/plan contracts, or as the patient moves through the different stages of their insurance plan.

## 2024-06-13 NOTE — Telephone Encounter (Signed)
 Prior authorization not required. Co-pay is $45.00 for 90 day supply

## 2024-06-20 ENCOUNTER — Encounter: Payer: Self-pay | Admitting: Neurology

## 2025-01-08 ENCOUNTER — Ambulatory Visit: Admitting: Neurology
